# Patient Record
Sex: Female | Born: 1970 | Hispanic: No | Marital: Married | Smoking: Former smoker
Health system: Southern US, Community
[De-identification: ages and names within clinical notes are randomized; demographics above are authoritative.]

## PROBLEM LIST (undated history)

## (undated) DIAGNOSIS — E669 Obesity, unspecified: Secondary | ICD-10-CM

## (undated) DIAGNOSIS — M549 Dorsalgia, unspecified: Secondary | ICD-10-CM

## (undated) DIAGNOSIS — E282 Polycystic ovarian syndrome: Secondary | ICD-10-CM

## (undated) DIAGNOSIS — G8929 Other chronic pain: Secondary | ICD-10-CM

## (undated) DIAGNOSIS — I10 Essential (primary) hypertension: Secondary | ICD-10-CM

## (undated) DIAGNOSIS — Z720 Tobacco use: Secondary | ICD-10-CM

## (undated) DIAGNOSIS — Z9049 Acquired absence of other specified parts of digestive tract: Secondary | ICD-10-CM

## (undated) DIAGNOSIS — I6529 Occlusion and stenosis of unspecified carotid artery: Secondary | ICD-10-CM

## (undated) DIAGNOSIS — E079 Disorder of thyroid, unspecified: Secondary | ICD-10-CM

## (undated) DIAGNOSIS — Z8669 Personal history of other diseases of the nervous system and sense organs: Secondary | ICD-10-CM

## (undated) DIAGNOSIS — L680 Hirsutism: Secondary | ICD-10-CM

## (undated) HISTORY — DX: Obesity, unspecified: E66.9

## (undated) HISTORY — DX: Occlusion and stenosis of unspecified carotid artery: I65.29

## (undated) HISTORY — DX: Other chronic pain: G89.29

## (undated) HISTORY — DX: Acquired absence of other specified parts of digestive tract: Z90.49

## (undated) HISTORY — DX: Personal history of other diseases of the nervous system and sense organs: Z86.69

## (undated) HISTORY — DX: Disorder of thyroid, unspecified: E07.9

## (undated) HISTORY — DX: Hirsutism: L68.0

## (undated) HISTORY — DX: Tobacco use: Z72.0

## (undated) HISTORY — DX: Dorsalgia, unspecified: M54.9

## (undated) HISTORY — DX: Polycystic ovarian syndrome: E28.2

## (undated) HISTORY — DX: Essential (primary) hypertension: I10

---

## 2004-02-13 ENCOUNTER — Emergency Department: Payer: Self-pay | Admitting: Emergency Medicine

## 2004-02-17 ENCOUNTER — Ambulatory Visit: Payer: Self-pay

## 2005-05-18 ENCOUNTER — Emergency Department: Payer: Self-pay | Admitting: Emergency Medicine

## 2005-09-27 ENCOUNTER — Encounter: Admission: RE | Admit: 2005-09-27 | Discharge: 2005-09-27 | Payer: Self-pay | Admitting: Unknown Physician Specialty

## 2005-11-13 ENCOUNTER — Ambulatory Visit: Payer: Self-pay | Admitting: Pain Medicine

## 2006-03-22 ENCOUNTER — Ambulatory Visit: Payer: Self-pay | Admitting: Endocrinology

## 2006-04-23 DIAGNOSIS — I6529 Occlusion and stenosis of unspecified carotid artery: Secondary | ICD-10-CM

## 2006-04-23 HISTORY — DX: Occlusion and stenosis of unspecified carotid artery: I65.29

## 2006-08-16 ENCOUNTER — Ambulatory Visit: Payer: Self-pay | Admitting: Unknown Physician Specialty
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
## 2007-01-28 ENCOUNTER — Other Ambulatory Visit: Payer: Self-pay

## 2007-01-28 ENCOUNTER — Emergency Department: Payer: Self-pay | Admitting: Emergency Medicine

## 2007-02-06 ENCOUNTER — Ambulatory Visit: Payer: Self-pay | Admitting: Internal Medicine

## 2007-04-24 DIAGNOSIS — Z9049 Acquired absence of other specified parts of digestive tract: Secondary | ICD-10-CM

## 2007-04-24 HISTORY — DX: Acquired absence of other specified parts of digestive tract: Z90.49

## 2007-04-24 HISTORY — PX: CHOLECYSTECTOMY: SHX55

## 2007-05-08 ENCOUNTER — Ambulatory Visit: Payer: Self-pay | Admitting: Unknown Physician Specialty

## 2007-07-07 ENCOUNTER — Ambulatory Visit: Payer: Self-pay | Admitting: Unknown Physician Specialty

## 2007-09-17 ENCOUNTER — Ambulatory Visit: Payer: Self-pay | Admitting: Internal Medicine

## 2007-09-18 ENCOUNTER — Ambulatory Visit: Payer: Self-pay | Admitting: Podiatry

## 2007-10-15 ENCOUNTER — Ambulatory Visit: Payer: Self-pay | Admitting: General Surgery

## 2007-10-15 ENCOUNTER — Other Ambulatory Visit: Payer: Self-pay

## 2007-10-23 ENCOUNTER — Ambulatory Visit: Payer: Self-pay | Admitting: General Surgery

## 2008-08-21 ENCOUNTER — Ambulatory Visit: Payer: Self-pay | Admitting: Internal Medicine

## 2008-09-08 ENCOUNTER — Ambulatory Visit: Payer: Self-pay | Admitting: Internal Medicine

## 2008-09-20 ENCOUNTER — Ambulatory Visit: Payer: Self-pay | Admitting: Internal Medicine

## 2008-09-21 ENCOUNTER — Ambulatory Visit: Payer: Self-pay | Admitting: Internal Medicine

## 2008-09-29 ENCOUNTER — Ambulatory Visit: Payer: Self-pay | Admitting: Internal Medicine

## 2008-11-01 ENCOUNTER — Ambulatory Visit: Payer: Self-pay | Admitting: Internal Medicine

## 2008-11-21 ENCOUNTER — Ambulatory Visit: Payer: Self-pay | Admitting: Internal Medicine

## 2008-12-22 ENCOUNTER — Ambulatory Visit: Payer: Self-pay | Admitting: Internal Medicine

## 2008-12-24 ENCOUNTER — Ambulatory Visit: Payer: Self-pay | Admitting: Unknown Physician Specialty

## 2009-01-03 ENCOUNTER — Ambulatory Visit: Payer: Self-pay | Admitting: Internal Medicine

## 2009-01-21 ENCOUNTER — Ambulatory Visit: Payer: Self-pay | Admitting: Internal Medicine

## 2009-03-18 ENCOUNTER — Other Ambulatory Visit: Payer: Self-pay | Admitting: Internal Medicine

## 2009-03-29 ENCOUNTER — Ambulatory Visit: Payer: Self-pay | Admitting: Family

## 2009-03-30 ENCOUNTER — Ambulatory Visit: Payer: Self-pay | Admitting: Internal Medicine

## 2009-04-23 ENCOUNTER — Ambulatory Visit: Payer: Self-pay | Admitting: Internal Medicine

## 2009-05-12 IMAGING — US US EXTREM LOW VENOUS*L*
1 series · 18 of 24 positions shown · non-contrast
Comparison: none

REASON FOR EXAM: Swelling LEFT Calf
COMMENTS:

PROCEDURE:     US  - US DOPPLER LOW EXTR LEFT  - September 18, 2007  [DATE]
RESULT:     The LEFT femoral and popliteal veins are normally compressible.
The waveform patterns are normal, and the color-flow images are normal.

[Series 1: us extrem low venous*left* · 18 of 24 slices shown]
[im 1/24]
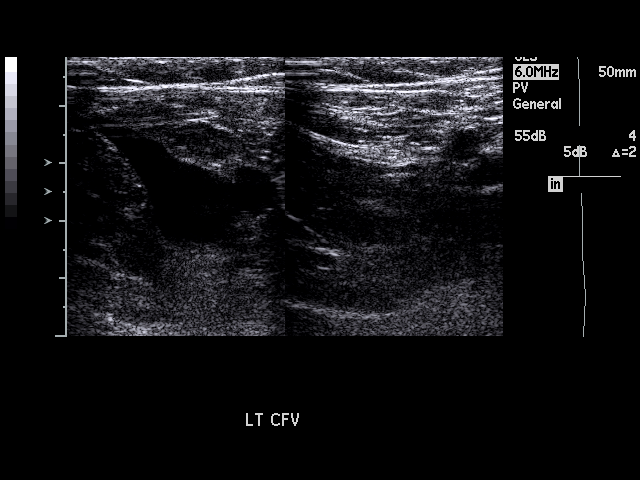
[im 3/24]
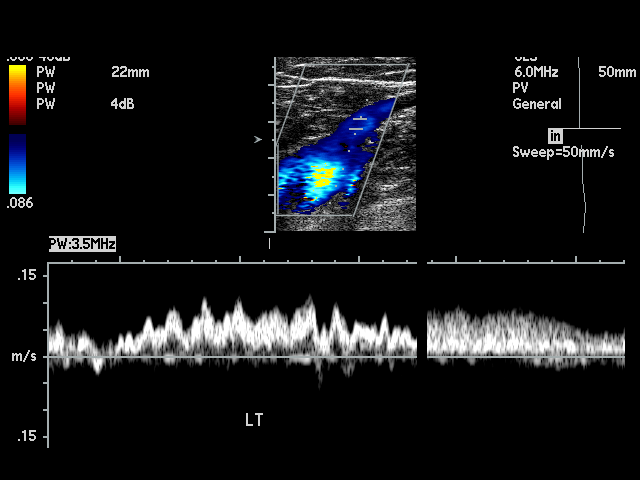
[im 4/24]
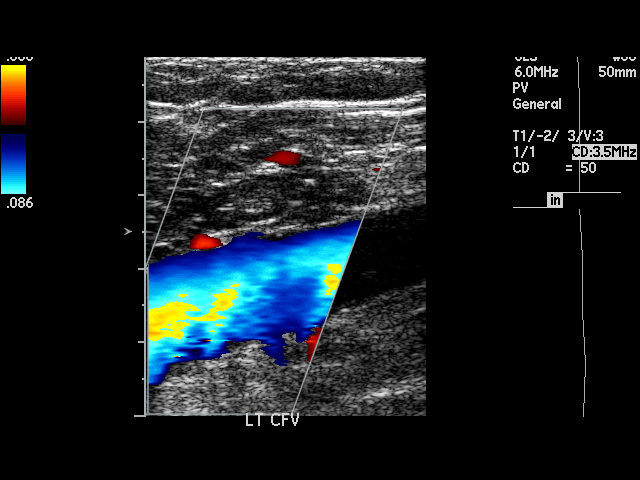
[im 5/24]
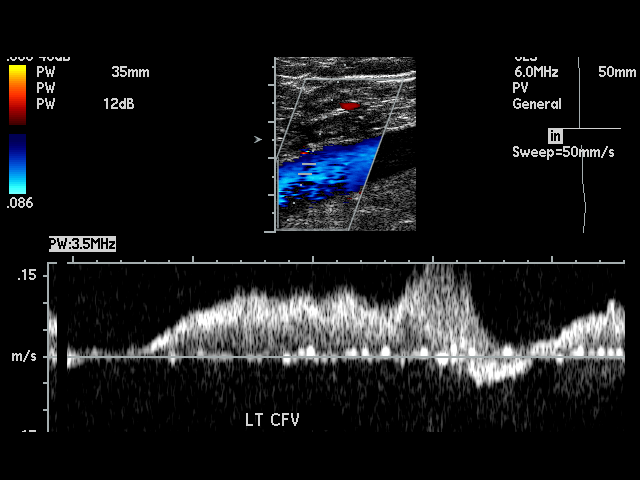
[im 7/24]
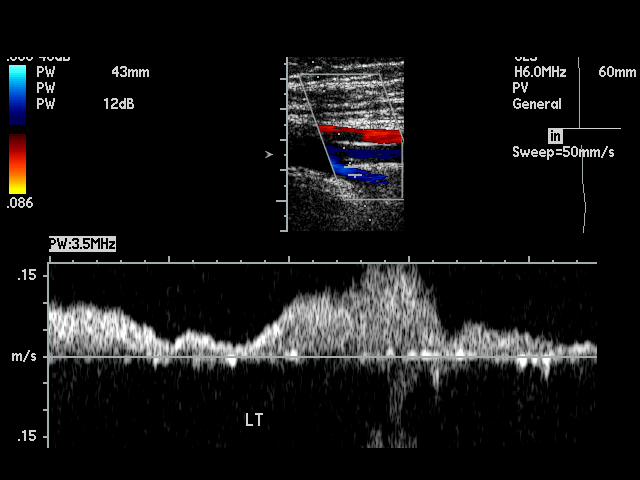
[im 8/24]
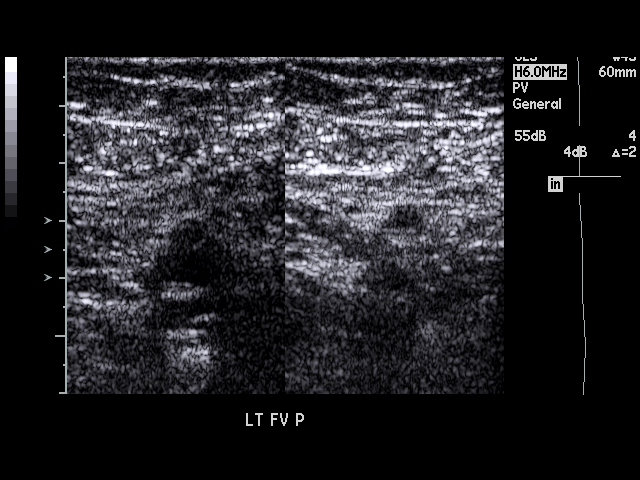
[im 9/24]
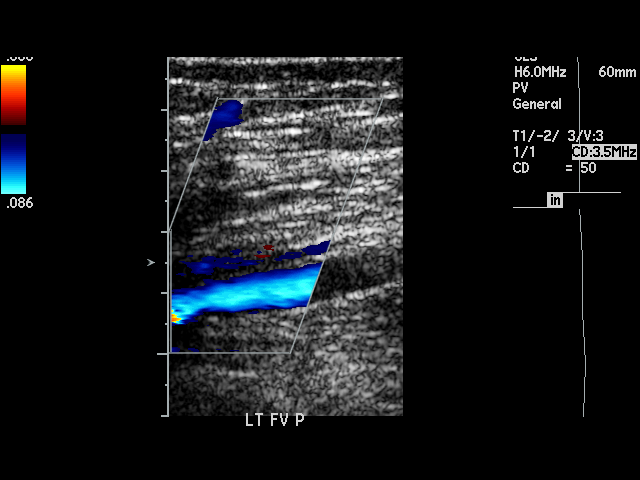
[im 11/24]
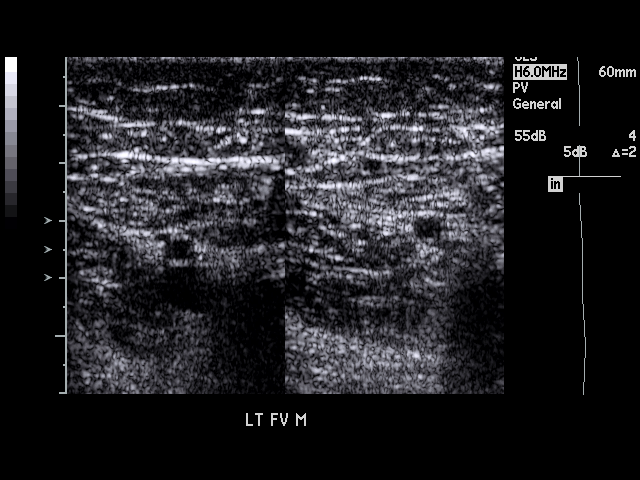
[im 12/24]
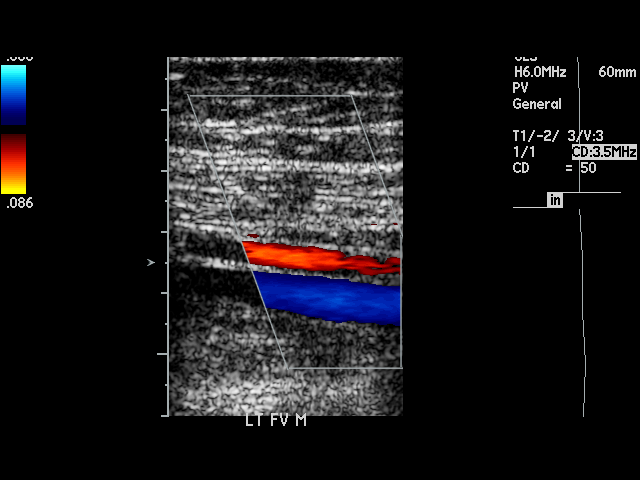
[im 13/24]
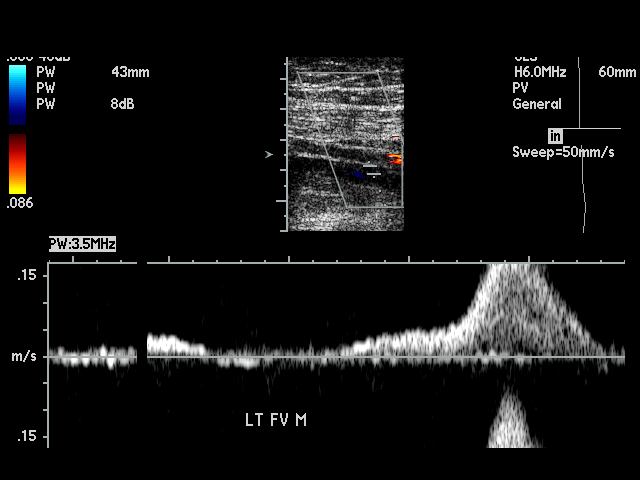
[im 15/24]
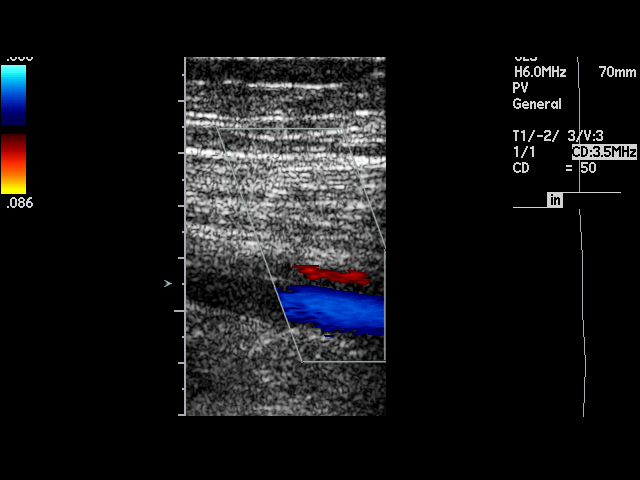
[im 16/24]
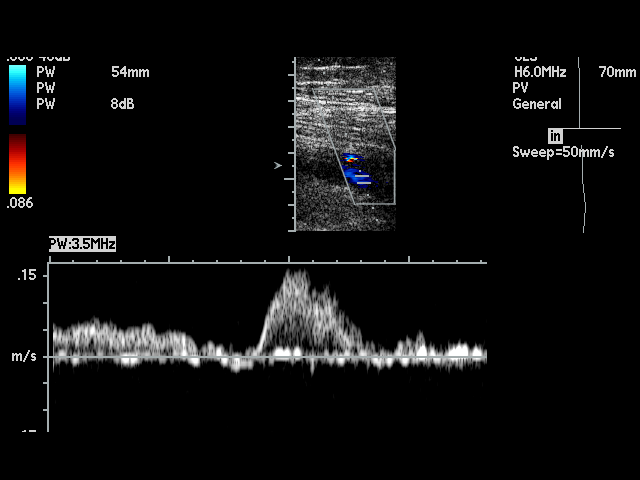
[im 17/24]
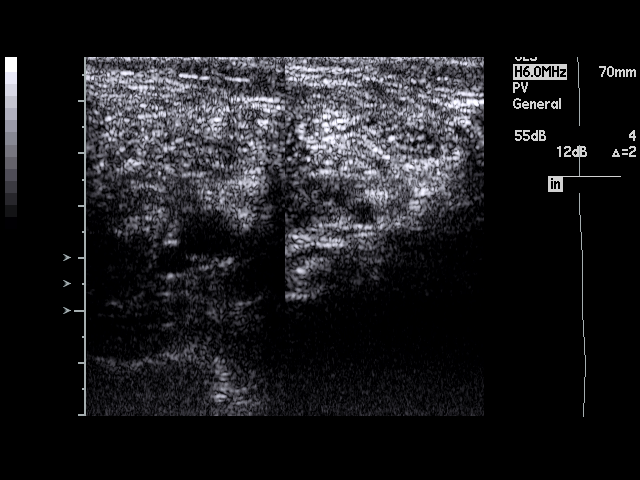
[im 19/24]
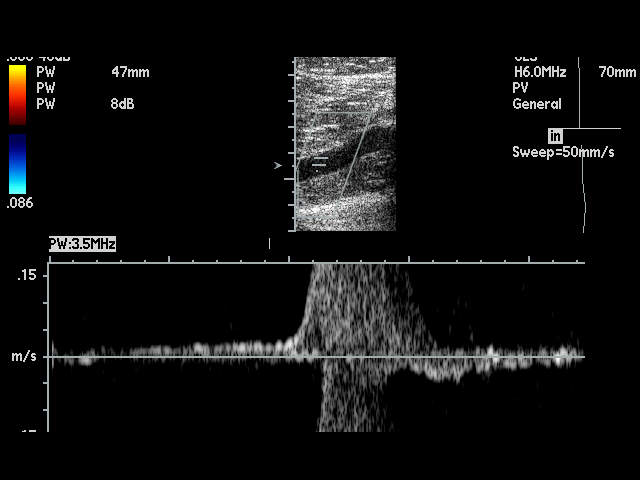
[im 20/24]
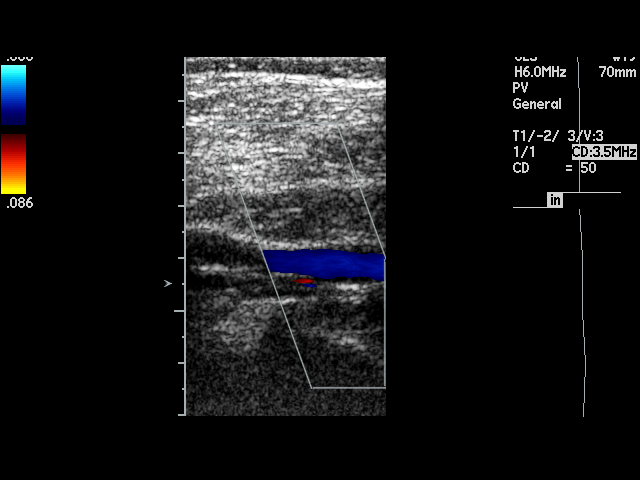
[im 21/24]
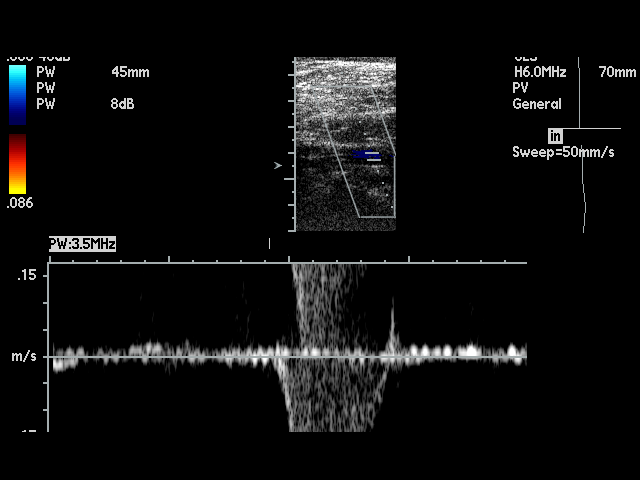
[im 23/24]
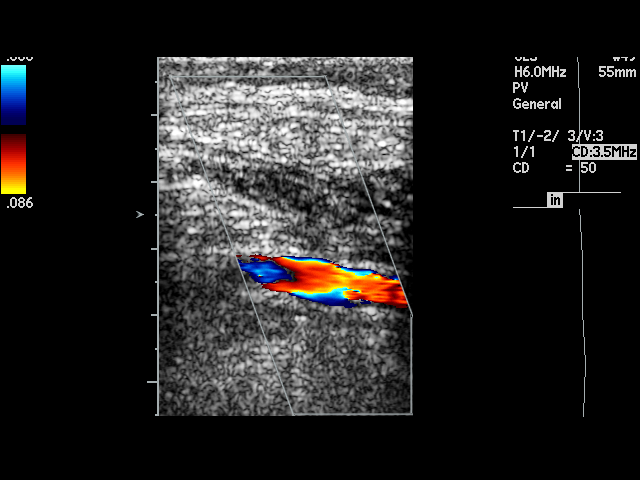
[im 24/24]
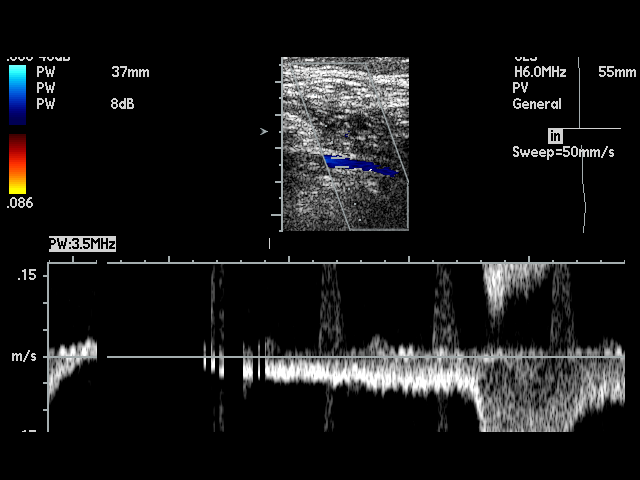

[18 of 24 positions shown; findings below may reference images not displayed]

IMPRESSION: I see no evidence of thrombus within the LEFT femoral or
popliteal veins.

## 2009-05-24 ENCOUNTER — Ambulatory Visit: Payer: Self-pay | Admitting: Internal Medicine

## 2009-06-20 ENCOUNTER — Ambulatory Visit: Payer: Self-pay | Admitting: Internal Medicine

## 2009-06-21 ENCOUNTER — Ambulatory Visit: Payer: Self-pay | Admitting: Internal Medicine

## 2010-01-09 ENCOUNTER — Observation Stay: Payer: Self-pay | Admitting: Internal Medicine

## 2010-01-19 ENCOUNTER — Ambulatory Visit: Payer: Self-pay | Admitting: Cardiovascular Disease

## 2010-01-26 ENCOUNTER — Ambulatory Visit: Payer: Self-pay | Admitting: Specialist

## 2010-01-26 ENCOUNTER — Ambulatory Visit: Payer: Self-pay | Admitting: Internal Medicine

## 2010-12-13 ENCOUNTER — Ambulatory Visit: Payer: Self-pay | Admitting: Internal Medicine

## 2010-12-13 ENCOUNTER — Encounter: Payer: Self-pay | Admitting: Internal Medicine

## 2010-12-13 ENCOUNTER — Ambulatory Visit (INDEPENDENT_AMBULATORY_CARE_PROVIDER_SITE_OTHER): Payer: BC Managed Care – PPO | Admitting: Internal Medicine

## 2010-12-13 DIAGNOSIS — E669 Obesity, unspecified: Secondary | ICD-10-CM

## 2010-12-13 DIAGNOSIS — G4733 Obstructive sleep apnea (adult) (pediatric): Secondary | ICD-10-CM

## 2010-12-13 DIAGNOSIS — J019 Acute sinusitis, unspecified: Secondary | ICD-10-CM

## 2010-12-13 DIAGNOSIS — R51 Headache: Secondary | ICD-10-CM

## 2010-12-13 DIAGNOSIS — J40 Bronchitis, not specified as acute or chronic: Secondary | ICD-10-CM | POA: Insufficient documentation

## 2010-12-13 HISTORY — DX: Obesity, unspecified: E66.9

## 2010-12-13 MED ORDER — AMOXICILLIN-POT CLAVULANATE 875-125 MG PO TABS: 1.0000 | ORAL_TABLET | Freq: Two times a day (BID) | ORAL | Status: AC

## 2010-12-13 MED ORDER — LEVOTHYROXINE SODIUM 175 MCG PO TABS: 175.0000 ug | ORAL_TABLET | Freq: Every day | ORAL | Status: DC

## 2010-12-13 MED ORDER — FLUCONAZOLE 150 MG PO TABS: 150.0000 mg | ORAL_TABLET | Freq: Every day | ORAL | Status: AC

## 2010-12-13 NOTE — Progress Notes (Signed)
  Subjective:    Patient ID: Madeline Strickland, female    DOB: 07-12-1970, 40 y.o.   MRN: [id number]  HPI Patient presents with 2 week history of sinus congestion, cough, scratch throat and ear pain.  Was seen by Dr. Alison Murray last week and given an rx for augmentin even though, per patient, Dr. Alison Murray did not feel she had a bacterial infection,  So patient did not start the medication.  Her prevous complaints of chest heaviness has resolved, but she contiues to have scratch throat, cough and ear pain.  No purulent rhinorrhea or fevers.    Review of Systems  Constitutional: Negative for fever, chills and unexpected weight change.  HENT: Positive for ear pain, congestion, rhinorrhea and sinus pressure. Negative for hearing loss, nosebleeds, sore throat, facial swelling, sneezing, mouth sores, trouble swallowing, neck pain, neck stiffness, voice change, postnasal drip, tinnitus and ear discharge.   Eyes: Negative for pain, discharge, redness and visual disturbance.  Respiratory: Positive for cough. Negative for chest tightness, shortness of breath, wheezing and stridor.   Cardiovascular: Negative for chest pain, palpitations and leg swelling.  Musculoskeletal: Negative for myalgias and arthralgias.  Skin: Negative for color change and rash.  Neurological: Negative for dizziness, weakness, light-headedness and headaches.  Hematological: Negative for adenopathy.       Objective:   Physical Exam  Constitutional: Vital signs are normal. She appears well-developed and well-nourished. She is active.  HENT:  Right Ear: Tympanic membrane is bulging.  Left Ear: Tympanic membrane is bulging.  Eyes: Pupils are equal, round, and reactive to light.  Cardiovascular: Normal rate, regular rhythm and S1 normal.   Pulmonary/Chest: No respiratory distress.          Assessment & Plan:  Otitis media:  Secondary to prlonged sinus congestion.  Incresae sudafed to tid,  Start augmentin.  Continue flonase rxd by  Dr. Alison Murray, and add simply saline bid to keep sinuses flushed.  Has history of large left sided maxillary polyp by prior ENT eval. Advised to use yogurt daily to prevent AAD.   Sleep apnea:  Needs split night study ,  At [facility].  Meredeth Ide is her pulmonologist.  Vaginitis:  Ex for fluconazole given for 2 day treatment of anticipated candidiasis.

## 2010-12-13 NOTE — Patient Instructions (Signed)
Use Simply Saline nasap spray twice daily before your Flonase to help flush your sinuses . Increase the sudafed to three times daily for the next week.

## 2010-12-18 ENCOUNTER — Other Ambulatory Visit: Payer: Self-pay | Admitting: Internal Medicine

## 2010-12-18 MED ORDER — MONTELUKAST SODIUM 10 MG PO TABS: 10.0000 mg | ORAL_TABLET | Freq: Every day | ORAL | Status: DC

## 2011-01-18 ENCOUNTER — Other Ambulatory Visit: Payer: Self-pay | Admitting: Internal Medicine

## 2011-01-19 ENCOUNTER — Telehealth: Payer: Self-pay | Admitting: Internal Medicine

## 2011-01-19 ENCOUNTER — Encounter: Payer: Self-pay | Admitting: Internal Medicine

## 2011-01-19 ENCOUNTER — Other Ambulatory Visit: Payer: Self-pay | Admitting: Internal Medicine

## 2011-01-19 ENCOUNTER — Ambulatory Visit (INDEPENDENT_AMBULATORY_CARE_PROVIDER_SITE_OTHER): Payer: BC Managed Care – PPO | Admitting: Internal Medicine

## 2011-01-19 VITALS — BP 112/69 | HR 70 | Temp 98.6°F | Resp 16 | Ht 68.0 in | Wt 247.8 lb

## 2011-01-19 DIAGNOSIS — J40 Bronchitis, not specified as acute or chronic: Secondary | ICD-10-CM

## 2011-01-19 DIAGNOSIS — J45901 Unspecified asthma with (acute) exacerbation: Secondary | ICD-10-CM

## 2011-01-19 DIAGNOSIS — J209 Acute bronchitis, unspecified: Secondary | ICD-10-CM

## 2011-01-19 MED ORDER — PREDNISONE (PAK) 10 MG PO TABS: ORAL_TABLET | ORAL | Status: AC

## 2011-01-19 MED ORDER — METHYLPREDNISOLONE ACETATE 40 MG/ML IJ SUSP
40.0000 mg | Freq: Once | INTRAMUSCULAR | Status: AC
  Administered 2011-01-19: 40 mg via INTRAMUSCULAR

## 2011-01-19 MED ORDER — AZITHROMYCIN 500 MG PO TABS: ORAL_TABLET | ORAL | Status: DC

## 2011-01-19 NOTE — Telephone Encounter (Signed)
Patient called asking for a late afternoon appt. Patient advised that Dr. Darrick Huntsman had no more opening per Morrie Sheldon and she should [facility]. Patient stated she usually calls and the M.D. Advises what to do. Patient wants M.D. Aware that she works out of town and can come in today at 4:30pm. Please assist and call patient back.

## 2011-01-19 NOTE — Patient Instructions (Signed)
We are treating you for asthma exacerbation secondary to bronchitis.   Take azithromycin 500 mg daily for 7 days, and prednisone 60 mg for three days,  Then start the taper by 10 mg daily until gone.

## 2011-01-19 NOTE — Progress Notes (Signed)
  Subjective:    Patient ID: Madeline Strickland, female    DOB: 03/07/1971, 40 y.o.   MRN: [id number]  HPI  40 yo AAA female with history of PCOS, obesity and asthma, and tobacco abuse presents with bronchitis, loss of voice,  symptoms started a week ago.    more short of breatrh at night, cough, and wheezing.    Review of Systems  Constitutional: Negative for fever, chills and unexpected weight change.  HENT: Negative for hearing loss, ear pain, nosebleeds, congestion, sore throat, facial swelling, rhinorrhea, sneezing, mouth sores, trouble swallowing, neck pain, neck stiffness, voice change, postnasal drip, sinus pressure, tinnitus and ear discharge.   Eyes: Negative for pain, discharge, redness and visual disturbance.  Respiratory: Positive for cough, chest tightness, shortness of breath and wheezing. Negative for stridor.   Cardiovascular: Negative for chest pain, palpitations and leg swelling.  Musculoskeletal: Negative for myalgias and arthralgias.  Skin: Negative for color change and rash.  Neurological: Negative for dizziness, weakness, light-headedness and headaches.  Hematological: Negative for adenopathy.       Objective:   Physical Exam  Constitutional: She is oriented to person, place, and time. She appears well-developed and well-nourished.  HENT:  Mouth/Throat: Oropharynx is clear and moist.  Eyes: EOM are normal. Pupils are equal, round, and reactive to light. No scleral icterus.  Neck: Normal range of motion. Neck supple. No JVD present. No thyromegaly present.  Cardiovascular: Normal rate, regular rhythm, normal heart sounds and intact distal pulses.   Pulmonary/Chest: Effort normal and breath sounds normal. She has no wheezes. She exhibits no tenderness.  Abdominal: Soft. Bowel sounds are normal. She exhibits no mass. There is no tenderness.  Musculoskeletal: Normal range of motion. She exhibits no edema.  Lymphadenopathy:    She has no cervical adenopathy.  Neurological: She  is alert and oriented to person, place, and time.  Skin: Skin is warm and dry.  Psychiatric: She has a normal mood and affect.          Assessment & Plan:

## 2011-01-19 NOTE — Telephone Encounter (Signed)
Patient is coming in this afternoon.

## 2011-01-20 ENCOUNTER — Encounter: Payer: Self-pay | Admitting: Internal Medicine

## 2011-01-20 DIAGNOSIS — I10 Essential (primary) hypertension: Secondary | ICD-10-CM | POA: Insufficient documentation

## 2011-01-20 DIAGNOSIS — E282 Polycystic ovarian syndrome: Secondary | ICD-10-CM | POA: Insufficient documentation

## 2011-01-20 DIAGNOSIS — E034 Atrophy of thyroid (acquired): Secondary | ICD-10-CM | POA: Insufficient documentation

## 2011-01-20 DIAGNOSIS — J45909 Unspecified asthma, uncomplicated: Secondary | ICD-10-CM | POA: Insufficient documentation

## 2011-01-21 ENCOUNTER — Encounter: Payer: Self-pay | Admitting: Internal Medicine

## 2011-01-21 DIAGNOSIS — Z87891 Personal history of nicotine dependence: Secondary | ICD-10-CM | POA: Insufficient documentation

## 2011-01-21 DIAGNOSIS — Z720 Tobacco use: Secondary | ICD-10-CM | POA: Insufficient documentation

## 2011-01-21 NOTE — Assessment & Plan Note (Signed)
Current exacerbation lasting 3-4 days.  She is currently not wheezing on exam but had just used her inhaler.  Prednisone taper and abs stated.

## 2011-01-22 MED ORDER — ALPRAZOLAM 0.5 MG PO TABS: 0.5000 mg | ORAL_TABLET | Freq: Two times a day (BID) | ORAL | Status: DC | PRN

## 2011-02-14 ENCOUNTER — Other Ambulatory Visit: Payer: Self-pay | Admitting: Internal Medicine

## 2011-03-16 ENCOUNTER — Encounter: Payer: Self-pay | Admitting: Internal Medicine

## 2011-03-20 ENCOUNTER — Other Ambulatory Visit: Payer: Self-pay | Admitting: Internal Medicine

## 2011-03-20 MED ORDER — METOPROLOL SUCCINATE ER 50 MG PO TB24: 50.0000 mg | ORAL_TABLET | Freq: Every day | ORAL | Status: DC

## 2011-03-27 ENCOUNTER — Other Ambulatory Visit (HOSPITAL_COMMUNITY)
Admission: RE | Admit: 2011-03-27 | Discharge: 2011-03-27 | Disposition: A | Discharge status: Home / Self Care | Payer: BC Managed Care – PPO | Source: Ambulatory Visit | Attending: Internal Medicine | Admitting: Internal Medicine

## 2011-03-27 ENCOUNTER — Encounter: Payer: Self-pay | Admitting: Internal Medicine

## 2011-03-27 ENCOUNTER — Ambulatory Visit (INDEPENDENT_AMBULATORY_CARE_PROVIDER_SITE_OTHER): Payer: BC Managed Care – PPO | Admitting: Internal Medicine

## 2011-03-27 DIAGNOSIS — R0683 Snoring: Secondary | ICD-10-CM

## 2011-03-27 DIAGNOSIS — E063 Autoimmune thyroiditis: Secondary | ICD-10-CM

## 2011-03-27 DIAGNOSIS — G4733 Obstructive sleep apnea (adult) (pediatric): Secondary | ICD-10-CM

## 2011-03-27 DIAGNOSIS — R0989 Other specified symptoms and signs involving the circulatory and respiratory systems: Secondary | ICD-10-CM

## 2011-03-27 DIAGNOSIS — Z124 Encounter for screening for malignant neoplasm of cervix: Secondary | ICD-10-CM

## 2011-03-27 DIAGNOSIS — Z1159 Encounter for screening for other viral diseases: Secondary | ICD-10-CM | POA: Insufficient documentation

## 2011-03-27 DIAGNOSIS — E669 Obesity, unspecified: Secondary | ICD-10-CM

## 2011-03-27 DIAGNOSIS — Z01419 Encounter for gynecological examination (general) (routine) without abnormal findings: Secondary | ICD-10-CM | POA: Insufficient documentation

## 2011-03-27 DIAGNOSIS — Z1239 Encounter for other screening for malignant neoplasm of breast: Secondary | ICD-10-CM

## 2011-03-27 DIAGNOSIS — R911 Solitary pulmonary nodule: Secondary | ICD-10-CM

## 2011-03-27 LAB — COMPREHENSIVE METABOLIC PANEL
ALT: 25 U/L (ref 0–35)
AST: 23 U/L (ref 0–37)
Albumin: 4 g/dL (ref 3.5–5.2)
Alkaline Phosphatase: 154 U/L — ABNORMAL HIGH (ref 39–117)
Potassium: 4.6 mEq/L (ref 3.5–5.1)
Sodium: 141 mEq/L (ref 135–145)
Total Bilirubin: 0.2 mg/dL — ABNORMAL LOW (ref 0.3–1.2)

## 2011-03-27 LAB — LIPID PANEL
LDL Cholesterol: 66 mg/dL (ref 0–99)
Total CHOL/HDL Ratio: 4
Triglycerides: 119 mg/dL (ref 0.0–149.0)
VLDL: 23.8 mg/dL (ref 0.0–40.0)

## 2011-03-27 LAB — TSH: TSH: 0.93 u[IU]/mL (ref 0.35–5.50)

## 2011-03-27 NOTE — Patient Instructions (Signed)
Laparoscopic Gastric Band Surgery This surgery is done to help you lose weight.  BEFORE THE SURGERY  Do not gain any more weight once you know you will be having this surgery.   Arrange for someone to take you home from the hospital.   The day before the surgery:   Eat small liquid meals such as broth.   Use half of the surgical scrub, if given, to shower or bathe with. Do not use the surgical scrub to wash your hair. Use regular shampoo.   The day of the surgery:   Shower or bathe using the second half of the surgical scrub.   Arrive at your appointment time.   You will change into a hospital gown.   A tube (IV) will be put in your vein.  SURGERY A band is put around the upper part of your stomach. This makes a small pouch which can hold only a small amount of food. The lower, bigger part of your stomach is below the band. The 2 parts stay connected by a small opening between the upper and the lower parts. Food goes through the opening to the lower part of your stomach more slowly than before the surgery. You will feel more full with smaller amounts of food. On the inner lining of the band around your stomach is a balloon. The balloon is empty during the surgery. Later, at an office visit, it is filled with fluid. Your doctor puts the fluid in through a tube (port) that is right under the skin of your belly. AFTER THE SURGERY  You will go to the recovery room.   You may be given pain medicine.   You may be asked to walk once you are stable.   You may have shoulder pain caused by the gas.   By the time you go home, try to walk for 35 minutes every day.   You will be shown how to use a small breathing machine (incentive spirometer). This will help you take deep breaths. You need to use this machine several times a day while you are in the hospital and after you go home.   You will need to take another test. For this test, you will swallow a liquid that will show up on X-ray.  You will have X-rays taken while you are in different positions. These X-rays will show:   If your new stomach has any leaks.   How well your new stomach holds liquids.   How the liquid moves down to your gut.   Try to not throw up (vomit). Tell your doctor if you feel sick to your stomach (nauseous). There is medicine to help keep you from throwing up.   Your diet will begin with drinking clear liquids (jello, tea, juice and broth) in small amounts.   Your doctor will decide when you are ready to drink or eat more.  Document Released: 05/12/2010 Document Revised: 12/20/2010 Document Reviewed: 05/12/2010 [facility] Patient Information 2012 [place], [place].

## 2011-03-27 NOTE — Progress Notes (Signed)
  Subjective:    Patient ID: Madeline Strickland, female    DOB: [birth year], 40 y.o.   MRN: [id number]  HPI 40 yo female with history of obesity, asthma, hypothyroidism , ongoing minimal tobacco abus, probable sleep apnea  presents for annual physical.  Other than fatigue, she feels generally well.  No asthmaxacerbations in over 6 monhts.  She is unable to lose weight despite making concerted efforts to restrict her calories and her carbohydrates.  Her exercise history is hindered by ehr astham and joint pain.  She is interested  In losing weight by pursing  lap band surgery.      Review of Systems  Constitutional: Negative for fever, chills and unexpected weight change.  HENT: Negative for hearing loss, ear pain, nosebleeds, congestion, sore throat, facial swelling, rhinorrhea, sneezing, mouth sores, trouble swallowing, neck pain, neck stiffness, voice change, postnasal drip, sinus pressure, tinnitus and ear discharge.   Eyes: Negative for pain, discharge, redness and visual disturbance.  Respiratory: Negative for cough, chest tightness, shortness of breath, wheezing and stridor.   Cardiovascular: Negative for chest pain, palpitations and leg swelling.  Musculoskeletal: Negative for myalgias and arthralgias.  Skin: Negative for color change and rash.  Neurological: Negative for dizziness, weakness, light-headedness and headaches.  Hematological: Negative for adenopathy.       Objective:   Physical Exam  Constitutional: She is oriented to person, place, and time. She appears well-developed and well-nourished.       Obese,  hirsute  HENT:  Mouth/Throat: Oropharynx is clear and moist.  Eyes: EOM are normal. Pupils are equal, round, and reactive to light. No scleral icterus.  Neck: Normal range of motion. Neck supple. No JVD present. No thyromegaly present.  Cardiovascular: Normal rate, regular rhythm, normal heart sounds and intact distal pulses.   Pulmonary/Chest: Effort normal and breath sounds  normal.  Abdominal: Soft. Bowel sounds are normal. She exhibits no mass. There is no tenderness.  Genitourinary: Vagina normal and uterus normal.  Musculoskeletal: Normal range of motion. She exhibits no edema.  Lymphadenopathy:    She has no cervical adenopathy.  Neurological: She is alert and oriented to person, place, and time.  Skin: Skin is warm and dry.  Psychiatric: She has a normal mood and affect.          Assessment & Plan:

## 2011-03-27 NOTE — Assessment & Plan Note (Addendum)
With a 140 lb wt gain since high school.    Wt was 244 bmi 37 in July,  No significant change.  Referral to [facility] to determine if she is a candidate for lap band surgery.

## 2011-03-28 ENCOUNTER — Encounter: Payer: Self-pay | Admitting: Internal Medicine

## 2011-03-28 DIAGNOSIS — M549 Dorsalgia, unspecified: Secondary | ICD-10-CM | POA: Insufficient documentation

## 2011-03-28 DIAGNOSIS — E282 Polycystic ovarian syndrome: Secondary | ICD-10-CM | POA: Insufficient documentation

## 2011-03-28 DIAGNOSIS — Z1239 Encounter for other screening for malignant neoplasm of breast: Secondary | ICD-10-CM | POA: Insufficient documentation

## 2011-03-28 NOTE — Assessment & Plan Note (Signed)
Breast exam was done today and screening mammogram ordered.

## 2011-03-28 NOTE — Assessment & Plan Note (Signed)
Suspected by history but sleep sutdy has been deferred.  She is willing to have it done now.  Study ordered

## 2011-03-28 NOTE — Assessment & Plan Note (Signed)
PAP was done today 

## 2011-04-03 ENCOUNTER — Encounter: Payer: Self-pay | Admitting: Internal Medicine

## 2011-04-09 ENCOUNTER — Other Ambulatory Visit: Payer: Self-pay | Admitting: Internal Medicine

## 2011-04-09 MED ORDER — ESOMEPRAZOLE MAGNESIUM 40 MG PO CPDR: 40.0000 mg | DELAYED_RELEASE_CAPSULE | Freq: Every day | ORAL | Status: DC

## 2011-04-09 NOTE — Telephone Encounter (Signed)
I called patient back and gave her [facility] numer which is [phone], she has to call and schedule a free weight loss seminar.  Her mammogram is scheduled for 05/29/11 at 6:00 at [facility].  I also advised her that I was waiting on Dr. Darrick Huntsman to sign the [facility] referral and they would contact her about the in home sleep study.

## 2011-04-11 ENCOUNTER — Telehealth: Payer: Self-pay | Admitting: Internal Medicine

## 2011-04-11 NOTE — Telephone Encounter (Signed)
Patient is still waiting on her authorization to receive her Nexium .

## 2011-04-12 ENCOUNTER — Other Ambulatory Visit: Payer: Self-pay | Admitting: *Deleted

## 2011-04-12 NOTE — Telephone Encounter (Signed)
There is already a note about this. I am closing this one.

## 2011-04-13 NOTE — Telephone Encounter (Signed)
Prior Berkley Harvey has already been done. I spoke with pharmacy and was advised that rx is ready for her to pick up. I called patient and notified her that her rx is ready at the pharmacy and that she will have a $18 dollar copay per the pharmacy .

## 2011-04-18 ENCOUNTER — Telehealth: Payer: Self-pay | Admitting: *Deleted

## 2011-04-18 NOTE — Telephone Encounter (Signed)
Prior auth given for nexium, advised pharmacy.  Approval letter placed on doctor's desk for signature and scanning. 

## 2011-04-26 ENCOUNTER — Other Ambulatory Visit: Payer: Self-pay | Admitting: *Deleted

## 2011-04-26 MED ORDER — MONTELUKAST SODIUM 10 MG PO TABS: 10.0000 mg | ORAL_TABLET | Freq: Every day | ORAL | Status: DC

## 2011-04-26 NOTE — Telephone Encounter (Signed)
Faxed request from [facility], last filled 03/24/11.

## 2011-04-30 ENCOUNTER — Ambulatory Visit (INDEPENDENT_AMBULATORY_CARE_PROVIDER_SITE_OTHER): Payer: BC Managed Care – PPO | Admitting: Internal Medicine

## 2011-04-30 ENCOUNTER — Encounter: Payer: Self-pay | Admitting: Internal Medicine

## 2011-04-30 VITALS — BP 104/70 | HR 80 | Temp 98.8°F | Wt 245.0 lb

## 2011-04-30 DIAGNOSIS — J45901 Unspecified asthma with (acute) exacerbation: Secondary | ICD-10-CM

## 2011-04-30 MED ORDER — AMOXICILLIN-POT CLAVULANATE 875-125 MG PO TABS: 1.0000 | ORAL_TABLET | Freq: Two times a day (BID) | ORAL | Status: AC

## 2011-04-30 MED ORDER — FLUCONAZOLE 150 MG PO TABS: 150.0000 mg | ORAL_TABLET | Freq: Every day | ORAL | Status: AC

## 2011-04-30 MED ORDER — ALBUTEROL SULFATE 1.25 MG/3ML IN NEBU: 1.0000 | INHALATION_SOLUTION | Freq: Four times a day (QID) | RESPIRATORY_TRACT | Status: DC | PRN

## 2011-04-30 NOTE — Patient Instructions (Addendum)
Try "Align" for the diarrhea.   Delsym  OTC for cough   Augmentin and albuterol and fluconazole  to [facility]
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
## 2011-05-28 ENCOUNTER — Other Ambulatory Visit: Payer: Self-pay | Admitting: *Deleted

## 2011-05-28 DIAGNOSIS — E282 Polycystic ovarian syndrome: Secondary | ICD-10-CM

## 2011-05-28 MED ORDER — SPIRONOLACTONE 100 MG PO TABS: 100.0000 mg | ORAL_TABLET | Freq: Every day | ORAL | Status: DC

## 2011-05-28 NOTE — Telephone Encounter (Signed)
Faxed request from [facility].  Request is for one tablet twice a day, chart has that she takes one a day.

## 2011-06-29 ENCOUNTER — Other Ambulatory Visit: Payer: Self-pay | Admitting: *Deleted

## 2011-06-29 MED ORDER — LEVOTHYROXINE SODIUM 175 MCG PO TABS: 175.0000 ug | ORAL_TABLET | Freq: Every day | ORAL | Status: DC

## 2011-07-13 ENCOUNTER — Telehealth: Payer: Self-pay | Admitting: Internal Medicine

## 2011-07-13 DIAGNOSIS — E282 Polycystic ovarian syndrome: Secondary | ICD-10-CM

## 2011-07-13 MED ORDER — SPIRONOLACTONE 100 MG PO TABS: 100.0000 mg | ORAL_TABLET | Freq: Two times a day (BID) | ORAL | Status: DC

## 2011-07-13 NOTE — Telephone Encounter (Signed)
Rx has been called in  

## 2011-07-13 NOTE — Telephone Encounter (Signed)
Left message asking patient to return my call.

## 2011-07-13 NOTE — Telephone Encounter (Signed)
Patient was taking two a day for years now the pharmacist tells her she is to take it once daily and that they called and verified it with this office.

## 2011-07-30 ENCOUNTER — Other Ambulatory Visit: Payer: Self-pay | Admitting: Internal Medicine

## 2011-07-30 MED ORDER — METOPROLOL SUCCINATE ER 50 MG PO TB24: 50.0000 mg | ORAL_TABLET | Freq: Every day | ORAL | Status: DC

## 2011-08-20 ENCOUNTER — Other Ambulatory Visit: Payer: Self-pay | Admitting: Internal Medicine

## 2011-08-20 MED ORDER — ALPRAZOLAM 0.5 MG PO TABS: 0.5000 mg | ORAL_TABLET | Freq: Two times a day (BID) | ORAL | Status: DC | PRN

## 2011-08-27 ENCOUNTER — Other Ambulatory Visit: Payer: Self-pay | Admitting: Internal Medicine

## 2011-08-27 MED ORDER — LEVOTHYROXINE SODIUM 175 MCG PO TABS: 175.0000 ug | ORAL_TABLET | Freq: Every day | ORAL | Status: DC

## 2011-08-27 MED ORDER — METOPROLOL SUCCINATE ER 50 MG PO TB24: 50.0000 mg | ORAL_TABLET | Freq: Every day | ORAL | Status: DC

## 2011-09-05 ENCOUNTER — Other Ambulatory Visit: Payer: Self-pay | Admitting: Internal Medicine

## 2011-09-05 MED ORDER — METOPROLOL SUCCINATE ER 50 MG PO TB24: 50.0000 mg | ORAL_TABLET | Freq: Every day | ORAL | Status: DC

## 2011-09-26 ENCOUNTER — Telehealth: Payer: Self-pay | Admitting: Internal Medicine

## 2011-09-26 NOTE — Telephone Encounter (Signed)
Left message on cell phone voicemail advising patient to return call.

## 2011-09-26 NOTE — Telephone Encounter (Signed)
Caller: Madeline Strickland/Patient; PCP: Duncan Dull; CB#: [phone];  Call regarding Cough/Congestion; LMP 05/13 Sinus congestion, facial pain, coughing up yellow phlegm onset 09/18/11. See in 24 per URI Protocol. No appts are available. [name] in the ofc advises UC. Pt is informed. She declines today. Will call in AM to see if there have been any cancellations.

## 2011-09-26 NOTE — Telephone Encounter (Signed)
Tell her to take generic OTC benadryl 25 mg evry 8 hours for the drainage,  Sudafed PE  10 to 20 mg every 6 hours for the congestion,  and flushes her sinuses twice daily with Simply Saline.   Based on symptoms given does not need abx

## 2011-09-27 NOTE — Telephone Encounter (Signed)
Patient notified, she stated she thinks she needs to be seen because she has asthma and always ends up in the ER.   I put her on with Dr. Dan Humphreys tomorrow.

## 2011-09-28 ENCOUNTER — Encounter: Payer: Self-pay | Admitting: Internal Medicine

## 2011-09-28 ENCOUNTER — Ambulatory Visit (INDEPENDENT_AMBULATORY_CARE_PROVIDER_SITE_OTHER): Payer: BC Managed Care – PPO | Admitting: Internal Medicine

## 2011-09-28 VITALS — BP 118/72 | HR 74 | Temp 99.1°F | Resp 16 | Wt 244.0 lb

## 2011-09-28 DIAGNOSIS — J0100 Acute maxillary sinusitis, unspecified: Secondary | ICD-10-CM

## 2011-09-28 MED ORDER — AMOXICILLIN-POT CLAVULANATE 875-125 MG PO TABS: 1.0000 | ORAL_TABLET | Freq: Two times a day (BID) | ORAL | Status: AC

## 2011-09-28 NOTE — Progress Notes (Signed)
Subjective:    Patient ID: Madeline Strickland, female    DOB: 19-Nov-1970, 41 y.o.   MRN: [id number]  HPI 41 year old female with history of obesity, asthma, hypertension presents for acute visit complaining of one-week history of purulent nasal drainage, sinus pressure, headache pain, fever, chills, and cough productive of purulent sputum. She denies any chest pain or shortness of breath. She reports significant pain across her cheeks and forehead. She has been using over-the-counter cough and cold preparations with no improvement in her symptoms.  Outpatient Prescriptions Prior to Visit  Medication Sig Dispense Refill  . albuterol (ACCUNEB) 1.25 MG/3ML nebulizer solution Take 3 mLs (1.25 mg total) by nebulization every 6 (six) hours as needed for wheezing.  75 mL  12  . ALPRAZolam (XANAX) 0.5 MG tablet Take 1 tablet (0.5 mg total) by mouth 2 (two) times daily as needed for sleep or anxiety.  60 tablet  3  . Alum & Mag Hydroxide-Simeth (MAALOX ADVANCED PO) Take by mouth at bedtime.        Marland Kitchen atorvastatin (LIPITOR) 40 MG tablet TAKE 1 TABLET EVERY DAY FOR CHOLESTEROL  30 tablet  9  . b complex vitamins tablet Take 1 tablet by mouth daily.        . Cyanocobalamin (VITAMIN B-12 IJ) Inject 1 mL as directed every 30 (thirty) days.        Marland Kitchen esomeprazole (NEXIUM) 40 MG capsule Take 1 capsule (40 mg total) by mouth daily before breakfast.  30 capsule  5  . ferrous fumarate (HEMOCYTE - 106 MG FE) 325 (106 FE) MG TABS Take 1 tablet by mouth.        . levothyroxine (SYNTHROID, LEVOTHROID) 175 MCG tablet Take 1 tablet (175 mcg total) by mouth daily.  30 tablet  5  . metoprolol succinate (TOPROL-XL) 50 MG 24 hr tablet Take 1 tablet (50 mg total) by mouth daily.  30 tablet  3  . montelukast (SINGULAIR) 10 MG tablet Take 1 tablet (10 mg total) by mouth at bedtime.  30 tablet  5  . spironolactone (ALDACTONE) 100 MG tablet Take 1 tablet (100 mg total) by mouth 2 (two) times daily.  60 tablet  3    Review of Systems   Constitutional: Positive for fever and chills. Negative for unexpected weight change.  HENT: Positive for congestion and sinus pressure. Negative for hearing loss, ear pain, nosebleeds, sore throat, facial swelling, rhinorrhea, sneezing, mouth sores, trouble swallowing, neck pain, neck stiffness, voice change, postnasal drip, tinnitus and ear discharge.   Eyes: Negative for pain, discharge, redness and visual disturbance.  Respiratory: Positive for cough. Negative for chest tightness, shortness of breath, wheezing and stridor.   Cardiovascular: Negative for chest pain, palpitations and leg swelling.  Musculoskeletal: Negative for myalgias and arthralgias.  Skin: Negative for color change and rash.  Neurological: Positive for headaches. Negative for dizziness, weakness and light-headedness.  Hematological: Negative for adenopathy.   BP 118/72  Pulse 74  Temp(Src) 99.1 F (37.3 C) (Oral)  Resp 16  Wt 244 lb (110.678 kg)  SpO2 99%     Objective:   Physical Exam  Constitutional: She is oriented to person, place, and time. She appears well-developed and well-nourished. No distress.  HENT:  Head: Normocephalic and atraumatic.  Right Ear: External ear normal.  Left Ear: External ear normal.  Nose: Mucosal edema present. Right sinus exhibits maxillary sinus tenderness.  Mouth/Throat: Oropharynx is clear and moist. No oropharyngeal exudate.  Eyes: Conjunctivae are normal. Pupils are equal, round,  and reactive to light. Right eye exhibits no discharge. Left eye exhibits no discharge. No scleral icterus.  Neck: Normal range of motion. Neck supple. No tracheal deviation present. No thyromegaly present.  Cardiovascular: Normal rate, regular rhythm, normal heart sounds and intact distal pulses.  Exam reveals no gallop and no friction rub.   No murmur heard. Pulmonary/Chest: Effort normal. No accessory muscle usage. Not tachypneic. No respiratory distress. She has no wheezes. She has rhonchi (few  scattered). She has no rales. She exhibits no tenderness.  Musculoskeletal: Normal range of motion. She exhibits no edema and no tenderness.  Lymphadenopathy:    She has no cervical adenopathy.  Neurological: She is alert and oriented to person, place, and time. No cranial nerve deficit. She exhibits normal muscle tone. Coordination normal.  Skin: Skin is warm and dry. No rash noted. She is not diaphoretic. No erythema. No pallor.  Psychiatric: She has a normal mood and affect. Her behavior is normal. Judgment and thought content normal.          Assessment & Plan:

## 2011-09-28 NOTE — Assessment & Plan Note (Signed)
Symptoms consistent with acute maxillary sinusitis. Will treat with Augmentin. Will use ibuprofen for inflammation. Patient will call if symptoms are not improving over the next 72 hours. If no improvement, would favor adding steroid taper.

## 2011-11-07 ENCOUNTER — Telehealth: Payer: Self-pay | Admitting: Internal Medicine

## 2011-11-07 NOTE — Telephone Encounter (Signed)
Caller: Viona/Patient; PCP: Duncan Dull; CB#: [phone];  Call regarding "Busted Ear Drum";  Reports itchy/painful L ear and intermittent minor bleeding on Q-tip.  Onset: 7/45/13.  Afebrile. Hx otitis media.  Stop itching ear with Q-tip; may try otc antihistamine for ear itching and cold compress prn.  Advised to see MD for constant dull earache may interfere with normal activities per Ear Symptoms Guideline. Already scheduled by office for appt 11/08/11 at 0900.

## 2011-11-08 ENCOUNTER — Encounter: Payer: Self-pay | Admitting: Internal Medicine

## 2011-11-08 ENCOUNTER — Ambulatory Visit (INDEPENDENT_AMBULATORY_CARE_PROVIDER_SITE_OTHER): Payer: BC Managed Care – PPO | Admitting: Internal Medicine

## 2011-11-08 VITALS — BP 112/72 | HR 67 | Temp 98.8°F | Wt 242.0 lb

## 2011-11-08 DIAGNOSIS — S0991XA Unspecified injury of ear, initial encounter: Secondary | ICD-10-CM

## 2011-11-08 DIAGNOSIS — E669 Obesity, unspecified: Secondary | ICD-10-CM

## 2011-11-08 DIAGNOSIS — S0993XA Unspecified injury of face, initial encounter: Secondary | ICD-10-CM

## 2011-11-08 DIAGNOSIS — H60509 Unspecified acute noninfective otitis externa, unspecified ear: Secondary | ICD-10-CM | POA: Insufficient documentation

## 2011-11-08 MED ORDER — MOMETASONE FUROATE 0.1 % EX OINT: TOPICAL_OINTMENT | Freq: Every day | CUTANEOUS | Status: AC

## 2011-11-08 MED ORDER — MONTELUKAST SODIUM 10 MG PO TABS: 10.0000 mg | ORAL_TABLET | Freq: Every day | ORAL | Status: DC

## 2011-11-08 MED ORDER — CYANOCOBALAMIN 1000 MCG/ML IJ SOLN: 1000.0000 ug | INTRAMUSCULAR | Status: DC

## 2011-11-08 NOTE — Progress Notes (Signed)
Patient ID: Madeline Strickland, female   DOB: 01-16-1971, 41 y.o.   MRN: [id number]

## 2011-11-08 NOTE — Patient Instructions (Addendum)
Consider a Low Glycemic Index Diet and eating 6 smaller meals daily .  This frequent feeding stimulates your metabolism and the lower glycemic index foods will lower your blood sugars:   This is an example of my daily  "Low GI"  Diet:  All of the foods can be found at grocery stores and in bulk at [employer]  club   7 AM Breakfast:  Low carbohydrate Protein  Shakes (I recommend the EAS AdvantEdge "Carb Control" shakes  Or the low carb shakes by Atkins.   Both are available everywhere:  In  cases at [employer]Js  Or in 4 packs at grocery stores and pharmacies  2.5 carbs  (Alternative is  a toasted Arnold's Sandwhich Thin w/ peanut butter, a "Bagel Thin" with cream cheese and salmon) or  a scrambled egg burrito made with a low carb tortilla .  Avoid cereal and bananas, oatmeal too unless the old fashioned kind that takes 30-40 minutes to prepare.  the rest is overly processed, has minimal fiber, and loaded with carbohydrates!   10 AM: Protein bar by Atkins (the snack size, under 200 cal.  There are many varieties , available widely again or in bulk in limited varieties at BJs)  Other so called "protein bars" tend to be loaded with carbohydrates.  Remember, in food advertising, the word "energy" is synonymous for " carbohydrate."  Lunch: sandwich of [place], (or any lunchmeat or canned tuna), fresh avocado and cheese on a lower carbohydrate pita bread, flatbread, or tortilla . Ok to use mayonnaise. The bread is the only source or carbohydrate that can be decreased (Joseph's makes a pita bread and a flat bread  Are 50 cal and 4 net carbs ; Toufayan makes a low carb flatbread 100 cal and 9 net carbs  and  Mission makes a low carb whole wheat tortilla  210 cal and 6 net carbs)  3 PM:  Mid day :  Another proteintttt bThe brear,  Or a  cheese stick (100 cal, 0 carbs),  Or 1 ounce of  almonds, walnuts, pistachios, pecans, peanuts,  Macadamia nuts. Or a Dannon light n Fit greek yogurt, 80 cal 8 net carbs . Avoid "granola"; the  dried cranberries and raisins are loaded with carbohydrates.    6 PM  Dinner:  "mean and green:"  Meat/chicken/fish or a high protein legume; , with a green salad, and a low GI  Veggie (broccoli, cauliflower, green beans, spinach, brussel sprouts. Lima beans) : Avoid "Low fat dressings, Reyne Dumas and [address]! They are loaded with sugar! Instead use ranch, vinagrette,  Blue cheese, etc  9 PM snack : Breyer's "low carb" fudgsicle or  ice cream bar (Carb Smart line), or  Weight Watcher's ice cream bar , or anouther "no sugar added" ice cream; or another protein shake or a serving of fresh fruit with whipped cream (Avoid bananas, pineapple, grapes  and watermelon on a regular basis because they are high in sugar)   Remember that snack Substitutions should be less than 15 to 20 carbs  Per serving. Remember to subtract fiber grams to get the "net carbs."

## 2011-11-10 ENCOUNTER — Encounter: Payer: Self-pay | Admitting: Internal Medicine

## 2011-11-10 DIAGNOSIS — S0991XA Unspecified injury of ear, initial encounter: Secondary | ICD-10-CM | POA: Insufficient documentation

## 2011-11-10 NOTE — Assessment & Plan Note (Signed)
She did not rupture her eardrum. She scraped the the lateral wall of the ear canal. It has the stigmata of recent bleeding. Recommended using sterile petroleum jelly to moisten eardrum.  once it heals we'll try a steroid cream to help with the itching.

## 2011-11-10 NOTE — Assessment & Plan Note (Signed)
She is room for improvement with her low glycemic index diet. I have printed her a copy of my low glycemic index diet and counselled her on how to count carbs and her products to avoid any which brought to use. We have discussed referral to bariatric Center but she is not ready for this at this time. I've also recommended that she increase her exercise to an activity that increases her heartrate to 120 for the aerobic benefit .   I will see her back in 3 months.

## 2011-11-14 ENCOUNTER — Other Ambulatory Visit: Payer: Self-pay | Admitting: Internal Medicine

## 2011-12-05 ENCOUNTER — Other Ambulatory Visit: Payer: Self-pay | Admitting: Internal Medicine

## 2011-12-30 ENCOUNTER — Other Ambulatory Visit: Payer: Self-pay | Admitting: Internal Medicine

## 2012-01-09 ENCOUNTER — Other Ambulatory Visit: Payer: Self-pay | Admitting: Internal Medicine

## 2012-01-09 MED ORDER — ATORVASTATIN CALCIUM 40 MG PO TABS: 40.0000 mg | ORAL_TABLET | Freq: Every day | ORAL | Status: DC

## 2012-04-07 ENCOUNTER — Other Ambulatory Visit: Payer: Self-pay | Admitting: Internal Medicine

## 2012-04-07 ENCOUNTER — Other Ambulatory Visit: Payer: Self-pay

## 2012-04-07 MED ORDER — SPIRONOLACTONE 100 MG PO TABS: 100.0000 mg | ORAL_TABLET | Freq: Two times a day (BID) | ORAL | Status: DC

## 2012-04-07 MED ORDER — METOPROLOL SUCCINATE ER 50 MG PO TB24: 50.0000 mg | ORAL_TABLET | Freq: Every day | ORAL | Status: DC

## 2012-04-07 NOTE — Telephone Encounter (Signed)
Refill request for Aldactone 100 mg and Metoprol 50 mg sent to [facility]

## 2012-04-08 ENCOUNTER — Other Ambulatory Visit: Payer: Self-pay | Admitting: Internal Medicine

## 2012-04-08 MED ORDER — ATORVASTATIN CALCIUM 40 MG PO TABS: 40.0000 mg | ORAL_TABLET | Freq: Every day | ORAL | Status: DC

## 2012-04-08 NOTE — Telephone Encounter (Signed)
Drug Name- Atorvastatin 40 mg tablet  Directions-Take 1 tablet every day for cholesterol  Quantity-30

## 2012-04-08 NOTE — Telephone Encounter (Signed)
Refill request for Lipitor 40 mg # 30 3 R sent to [facility]

## 2012-04-17 ENCOUNTER — Other Ambulatory Visit: Payer: Self-pay | Admitting: Internal Medicine

## 2012-04-18 ENCOUNTER — Other Ambulatory Visit: Payer: Self-pay | Admitting: General Practice

## 2012-04-18 MED ORDER — ESOMEPRAZOLE MAGNESIUM 40 MG PO CPDR: DELAYED_RELEASE_CAPSULE | ORAL | Status: DC

## 2012-04-18 MED ORDER — ALPRAZOLAM 0.5 MG PO TABS: 0.5000 mg | ORAL_TABLET | Freq: Two times a day (BID) | ORAL | Status: DC | PRN

## 2012-04-18 MED ORDER — SPIRONOLACTONE 100 MG PO TABS: 100.0000 mg | ORAL_TABLET | Freq: Two times a day (BID) | ORAL | Status: DC

## 2012-04-18 NOTE — Telephone Encounter (Signed)
These were done in error so disregard. Shanda Bumps called correct amount in to pharmacy for [place].

## 2012-04-18 NOTE — Telephone Encounter (Signed)
This is Dr Melina Schools pt.  I don't usually give 60 tablets with refills (on Xanax).  I don't know if she gave you the ok for this many pills.  If not, I would want to clarify with her before prescribing this number of pills.  Need to change rx.

## 2012-04-25 ENCOUNTER — Telehealth: Payer: Self-pay | Admitting: Internal Medicine

## 2012-04-25 NOTE — Telephone Encounter (Signed)
Make an appt

## 2012-04-25 NOTE — Telephone Encounter (Signed)
Forward to Dr. Tullo 

## 2012-04-25 NOTE — Telephone Encounter (Signed)
Attempted to call patient back. She has lost 13 pounds in 10 days and is not trying to lose weight. Left message for patient to call office back.

## 2012-04-28 ENCOUNTER — Encounter: Payer: Self-pay | Admitting: Internal Medicine

## 2012-04-28 ENCOUNTER — Telehealth: Payer: Self-pay | Admitting: Internal Medicine

## 2012-04-28 ENCOUNTER — Ambulatory Visit (INDEPENDENT_AMBULATORY_CARE_PROVIDER_SITE_OTHER): Payer: BC Managed Care – PPO | Admitting: Internal Medicine

## 2012-04-28 VITALS — BP 118/76 | HR 79 | Temp 98.1°F | Resp 16 | Wt 232.5 lb

## 2012-04-28 DIAGNOSIS — IMO0002 Reserved for concepts with insufficient information to code with codable children: Secondary | ICD-10-CM

## 2012-04-28 DIAGNOSIS — E1169 Type 2 diabetes mellitus with other specified complication: Secondary | ICD-10-CM | POA: Insufficient documentation

## 2012-04-28 DIAGNOSIS — E1165 Type 2 diabetes mellitus with hyperglycemia: Secondary | ICD-10-CM

## 2012-04-28 DIAGNOSIS — R350 Frequency of micturition: Secondary | ICD-10-CM

## 2012-04-28 DIAGNOSIS — E119 Type 2 diabetes mellitus without complications: Secondary | ICD-10-CM

## 2012-04-28 LAB — POCT URINALYSIS DIPSTICK
Protein, UA: NEGATIVE
Spec Grav, UA: <=1.005
Urobilinogen, UA: 0.2

## 2012-04-28 MED ORDER — INSULIN GLARGINE 100 UNIT/ML ~~LOC~~ SOLN: 20.0000 [IU] | Freq: Every day | SUBCUTANEOUS | Status: DC

## 2012-04-28 MED ORDER — FREESTYLE SYSTEM KIT: 1.0000 | PACK | Status: DC | PRN

## 2012-04-28 NOTE — Telephone Encounter (Signed)
Patient has scheduled appointment for 04-28-2012
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
## 2012-04-28 NOTE — Telephone Encounter (Signed)
For this pt would you like a urine culture? Thank you

## 2012-04-28 NOTE — Addendum Note (Signed)
Addended by: Sherlene Shams on: 04/28/2012 06:06 PM   Modules accepted: Orders

## 2012-04-28 NOTE — Telephone Encounter (Signed)
No thanks  

## 2012-04-28 NOTE — Assessment & Plan Note (Signed)
New onset,  complicated by obesity and recent changes in vision. Patient is urged to see her regular optometrist for visual exam. I fasting sorry capillary blood glucose today was between 3 and 400. Hemoglobin A1c is pending. She has had prior trials of metformin for treatment of PCO S. which caused horrible diarrhea. We discussed the use of the basal insulin while titrating her oral medications. Glucometer and lancets prescriptions were sent her pharmacy. She will return for a nurse visit to receive instruction on how to use the glucometer and how to self administer 20 units of insulin. Low carbohydrate diet recommended.

## 2012-04-28 NOTE — Telephone Encounter (Signed)
rx for lantus insulin pen and a glucometer, sent to her pharmacy .  Please have her return for a n RN visit to learn how to check blood sugars and use the pen ASAP

## 2012-04-28 NOTE — Telephone Encounter (Signed)
Prior Authorization required for Nexium   Fax form from pharmacy is in physicians box.

## 2012-04-28 NOTE — Patient Instructions (Addendum)
You have developed diabetes .  Your Hgba1c is pending.    Depending on the results, we may have you come back to learn how to give you an insulin dose   This is  my version of a  "Low GI"  Diet:  All of the foods can be found at grocery stores and in bulk at [employer].  The Atkins protein bars and shakes are available in more varieties at [employer], [employer] and [employer].     7 AM Breakfast:  Low carbohydrate Protein  Shakes (I recommend the EAS AdvantEdge "Carb Control" shakes  Or the low carb shakes by Atkins.   Both are available everywhere:  In  cases at BJs  Or in 4 packs at grocery stores and pharmacies  2.5 carbs  (Alternative is  a toasted Arnold's Sandwhich Thin w/ peanut butter, a "Bagel Thin" with cream cheese and salmon) or  a scrambled egg burrito made with a low carb tortilla .  Avoid cereal and bananas, oatmeal too unless you are cooking the old fashioned kind that takes 30-40 minutes to prepare.  the rest is overly processed, has minimal fiber, and is loaded with carbohydrates!   10 AM: Protein bar by Atkins (the snack size, under 200 cal).  There are many varieties , available widely again or in bulk in limited varieties at BJs)  Other so called "protein bars" tend to be loaded with carbohydrates.  Remember, in food advertising, the word "energy" is synonymous for " carbohydrate."  Lunch: sandwich of [place], (or any lunchmeat, grilled meat or canned tuna), fresh avocado, mayonnaise  and cheese on a lower carbohydrate pita bread, flatbread, or tortilla . Ok to use regular mayonnaise. The bread is the only source or carbohydrate that can be decreased (Joseph's makes a pita bread and a flat bread that are 50 cal and 4 net carbs ; Toufayan makes a low carb flatbread that's 100 cal and 9 net carbs  and  Mission makes a low carb whole wheat tortilla  That is 210 cal and 6 net carbs)  3 PM:  Mid day :  Another protein bar,  Or a  cheese stick (100 cal, 0 carbs),  Or 1 ounce of  almonds,  walnuts, pistachios, pecans, peanuts,  Macadamia nuts. Or a Dannon light n Fit greek yogurt, 80 cal 8 net carbs . Avoid "granola"; the dried cranberries and raisins are loaded with carbohydrates. Mixed nuts ok if no raisins or cranberries or dried fruit.      6 PM  Dinner:  "mean and green:"  Meat/chicken/fish or a high protein legume; , with a green salad, and a low GI  Veggie (broccoli, cauliflower, green beans, spinach, brussel sprouts. Lima beans) : Avoid "Low fat dressings, as well as Reyne Dumas and [address]! They are loaded with sugar! Instead use ranch, vinagrette,  Blue cheese, etc.  There is a low carb pasta by Dreamfield's available at [facility] that is acceptable and tastes great. Try Michel Angelo's chicken piccata, or eggplant or chicken parmigiana over low carb pasta. The chicken dish is 0 carbs, and can be found in frozen section at BJs and [employer]. Also try [employer] "Carnitas" (pulled pork, no sauce,  0 carbs) and his pot roast.   both are in the refrigerated section at BJs   9 PM snack : Breyer's "low carb" fudgsicle or  ice cream bar (Carb Smart line), or  Weight Watcher's ice cream bar , or another "no sugar  added" ice cream;a serving of fresh berries/cherries with whipped cream (Avoid bananas, pineapple, grapes  and watermelon on a regular basis because they are high in sugar)   Remember that snack Substitutions should be less than 15 to 20 carbs  Per serving. Remember to subtract fiber grams and sugar alcohols to get the "net carbs."

## 2012-04-29 ENCOUNTER — Encounter: Payer: Self-pay | Admitting: Internal Medicine

## 2012-04-29 ENCOUNTER — Other Ambulatory Visit: Payer: Self-pay | Admitting: General Practice

## 2012-04-29 ENCOUNTER — Other Ambulatory Visit: Payer: Self-pay | Admitting: Internal Medicine

## 2012-04-29 ENCOUNTER — Ambulatory Visit: Payer: BC Managed Care – PPO

## 2012-04-29 LAB — COMPREHENSIVE METABOLIC PANEL
CO2: 28 mEq/L (ref 19–32)
Creatinine, Ser: 1.4 mg/dL — ABNORMAL HIGH (ref 0.4–1.2)
GFR: 54.01 mL/min — ABNORMAL LOW (ref >60.00)
Glucose, Bld: 497 mg/dL — ABNORMAL HIGH (ref 70–99)
Total Bilirubin: 0.6 mg/dL (ref 0.3–1.2)

## 2012-04-29 LAB — MICROALBUMIN / CREATININE URINE RATIO
Creatinine,U: 65.8 mg/dL
Microalb Creat Ratio: 1.1 mg/g (ref 0.0–30.0)
Microalb, Ur: 0.7 mg/dL (ref 0.0–1.9)

## 2012-04-29 MED ORDER — INSULIN PEN NEEDLE 31G X 5 MM MISC: Status: DC

## 2012-04-29 MED ORDER — FLUCONAZOLE 150 MG PO TABS: 150.0000 mg | ORAL_TABLET | Freq: Every day | ORAL | Status: DC

## 2012-04-29 MED ORDER — SAFETY LANCETS 28G MISC: 1.0000 | Status: DC | PRN

## 2012-04-29 MED ORDER — GLUCOSE BLOOD VI STRP: ORAL_STRIP | Status: DC

## 2012-04-29 NOTE — Telephone Encounter (Signed)
Insulin supplies sent in for pt.

## 2012-05-02 ENCOUNTER — Other Ambulatory Visit: Payer: Self-pay | Admitting: Internal Medicine

## 2012-05-02 NOTE — Telephone Encounter (Signed)
[facility] needing prior authorization on  esomeprazole (NEXIUM) 40 MG capsule  # 30  1- [id number]  Faxed form in physicians box

## 2012-05-02 NOTE — Telephone Encounter (Signed)
PA needed for Nexium. Insurance company called waiting on fax.

## 2012-05-05 ENCOUNTER — Encounter: Payer: Self-pay | Admitting: General Practice

## 2012-05-05 ENCOUNTER — Encounter: Payer: Self-pay | Admitting: Internal Medicine

## 2012-05-05 ENCOUNTER — Telehealth: Payer: Self-pay | Admitting: *Deleted

## 2012-05-05 NOTE — Telephone Encounter (Signed)
Called [phone] for prior authorization, spoke to laurie she is faxing the form now the case number is [id number]

## 2012-05-05 NOTE — Telephone Encounter (Signed)
Still no fax from [employer].

## 2012-05-06 ENCOUNTER — Other Ambulatory Visit: Payer: Self-pay | Admitting: Internal Medicine

## 2012-05-06 MED ORDER — GLIPIZIDE 5 MG PO TABS: 5.0000 mg | ORAL_TABLET | Freq: Two times a day (BID) | ORAL | Status: DC

## 2012-05-06 MED ORDER — METFORMIN HCL 500 MG PO TABS: 500.0000 mg | ORAL_TABLET | Freq: Two times a day (BID) | ORAL | Status: DC

## 2012-05-06 MED ORDER — ESOMEPRAZOLE MAGNESIUM 40 MG PO CPDR: DELAYED_RELEASE_CAPSULE | ORAL | Status: DC

## 2012-05-06 NOTE — Telephone Encounter (Signed)
PA form filled out and faxed to insurance company on 05/06/12

## 2012-05-06 NOTE — Addendum Note (Signed)
Addended by: Jackson Latino on: 05/06/2012 12:31 PM   Modules accepted: Orders

## 2012-05-06 NOTE — Telephone Encounter (Signed)
PA for NExium approved on 05/06/12. Approved from 04/23/12 until 05/14/13.

## 2012-05-07 MED ORDER — METFORMIN HCL ER 750 MG PO TB24: 750.0000 mg | ORAL_TABLET | Freq: Every day | ORAL | Status: DC

## 2012-05-13 ENCOUNTER — Encounter: Payer: Self-pay | Admitting: Internal Medicine

## 2012-05-31 ENCOUNTER — Encounter: Payer: Self-pay | Admitting: Internal Medicine

## 2012-06-07 ENCOUNTER — Other Ambulatory Visit: Payer: Self-pay

## 2012-06-07 ENCOUNTER — Encounter: Payer: Self-pay | Admitting: Internal Medicine

## 2012-07-02 ENCOUNTER — Other Ambulatory Visit: Payer: Self-pay | Admitting: Internal Medicine

## 2012-07-03 NOTE — Telephone Encounter (Signed)
Med filled.  

## 2012-07-22 ENCOUNTER — Encounter: Payer: Self-pay | Admitting: Internal Medicine

## 2012-07-22 ENCOUNTER — Ambulatory Visit (INDEPENDENT_AMBULATORY_CARE_PROVIDER_SITE_OTHER): Payer: BC Managed Care – PPO | Admitting: Internal Medicine

## 2012-07-22 VITALS — BP 120/72 | HR 71 | Temp 98.7°F | Resp 16 | Wt 230.2 lb

## 2012-07-22 DIAGNOSIS — E1169 Type 2 diabetes mellitus with other specified complication: Secondary | ICD-10-CM

## 2012-07-22 DIAGNOSIS — E1149 Type 2 diabetes mellitus with other diabetic neurological complication: Secondary | ICD-10-CM

## 2012-07-22 DIAGNOSIS — E785 Hyperlipidemia, unspecified: Secondary | ICD-10-CM

## 2012-07-22 DIAGNOSIS — E669 Obesity, unspecified: Secondary | ICD-10-CM

## 2012-07-22 DIAGNOSIS — E119 Type 2 diabetes mellitus without complications: Secondary | ICD-10-CM

## 2012-07-22 MED ORDER — ACYCLOVIR 400 MG PO TABS: 400.0000 mg | ORAL_TABLET | ORAL | Status: DC

## 2012-07-22 MED ORDER — INSULIN GLARGINE 100 UNIT/ML ~~LOC~~ SOLN: 2.0000 [IU] | Freq: Every day | SUBCUTANEOUS | Status: DC
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
## 2012-07-22 NOTE — Patient Instructions (Signed)
You have done fantastic!  You do not need the Lantus or any medications anymore

## 2012-07-24 ENCOUNTER — Encounter: Payer: Self-pay | Admitting: Internal Medicine

## 2012-07-24 DIAGNOSIS — E785 Hyperlipidemia, unspecified: Secondary | ICD-10-CM | POA: Insufficient documentation

## 2012-07-24 DIAGNOSIS — E1169 Type 2 diabetes mellitus with other specified complication: Secondary | ICD-10-CM | POA: Insufficient documentation

## 2012-07-24 NOTE — Assessment & Plan Note (Signed)
Remarkable control over the last 3 months achieved with restricted carbohydrate diet and the use of insulin. She is now only using one or 2 units of insulin daily and only on the days where she has a discharge. I recommended that she return next week for repeat A1c and blood work. Her BMI is still 35 and I recommended that she begin an exercise program with a personal trainer to guide her efforts.

## 2012-07-24 NOTE — Assessment & Plan Note (Signed)
Managed with Lipitor 40 mg daily. She is high risk for coronary artery disease given her history of tobacco abuse, and new-onset diabetes, sedentary lifestyle, and family history.

## 2012-07-24 NOTE — Assessment & Plan Note (Signed)
She has not lost any weight in the last 3 months despite a very low carb diet. I recommended that she increase her activity and using a personal trainer to help guide her efforts to keep her foc improving her cardiac condition.

## 2012-07-29 ENCOUNTER — Encounter: Payer: Self-pay | Admitting: Internal Medicine

## 2012-07-29 ENCOUNTER — Other Ambulatory Visit (INDEPENDENT_AMBULATORY_CARE_PROVIDER_SITE_OTHER): Payer: BC Managed Care – PPO

## 2012-07-29 DIAGNOSIS — E1149 Type 2 diabetes mellitus with other diabetic neurological complication: Secondary | ICD-10-CM

## 2012-07-29 LAB — LIPID PANEL
Cholesterol: 93 mg/dL (ref 0–200)
HDL: 24.1 mg/dL — ABNORMAL LOW (ref >39.00)
LDL Cholesterol: 57 mg/dL (ref 0–99)
Total CHOL/HDL Ratio: 4
Triglycerides: 60 mg/dL (ref 0.0–149.0)
VLDL: 12 mg/dL (ref 0.0–40.0)

## 2012-07-29 LAB — COMPREHENSIVE METABOLIC PANEL
AST: 23 U/L (ref 0–37)
Albumin: 3.7 g/dL (ref 3.5–5.2)
BUN: 14 mg/dL (ref 6–23)
CO2: 24 mEq/L (ref 19–32)
Calcium: 8.9 mg/dL (ref 8.4–10.5)
Chloride: 105 mEq/L (ref 96–112)
Creatinine, Ser: 1 mg/dL (ref 0.4–1.2)
GFR: 74.14 mL/min (ref >60.00)
Glucose, Bld: 94 mg/dL (ref 70–99)
Potassium: 3.9 mEq/L (ref 3.5–5.1)

## 2012-07-29 LAB — MICROALBUMIN / CREATININE URINE RATIO: Creatinine,U: 147.4 mg/dL

## 2012-08-03 ENCOUNTER — Other Ambulatory Visit: Payer: Self-pay | Admitting: Internal Medicine

## 2012-08-04 NOTE — Telephone Encounter (Signed)
Med filled.  

## 2012-08-07 ENCOUNTER — Encounter: Payer: Self-pay | Admitting: Internal Medicine

## 2012-08-08 ENCOUNTER — Other Ambulatory Visit: Payer: Self-pay | Admitting: Internal Medicine

## 2012-08-08 MED ORDER — FUROSEMIDE 20 MG PO TABS: 20.0000 mg | ORAL_TABLET | Freq: Every day | ORAL | Status: DC

## 2012-08-27 ENCOUNTER — Other Ambulatory Visit: Payer: Self-pay | Admitting: Internal Medicine

## 2012-08-28 NOTE — Telephone Encounter (Signed)
Spoke with [facility], has numerous refills available. Refill denied.

## 2012-09-10 ENCOUNTER — Other Ambulatory Visit: Payer: Self-pay | Admitting: Internal Medicine

## 2012-09-11 NOTE — Telephone Encounter (Signed)
Rx sent to pharmacy by escript  

## 2012-11-03 ENCOUNTER — Other Ambulatory Visit: Payer: Self-pay | Admitting: Internal Medicine

## 2012-11-04 NOTE — Telephone Encounter (Signed)
Rx faxed to pharmacy  

## 2012-11-05 ENCOUNTER — Other Ambulatory Visit: Payer: Self-pay | Admitting: Internal Medicine

## 2012-11-07 ENCOUNTER — Ambulatory Visit: Payer: BC Managed Care – PPO | Admitting: Internal Medicine

## 2012-11-11 ENCOUNTER — Ambulatory Visit: Payer: BC Managed Care – PPO | Admitting: Internal Medicine

## 2012-11-11 ENCOUNTER — Encounter: Payer: Self-pay | Admitting: Internal Medicine

## 2012-11-11 ENCOUNTER — Telehealth: Payer: Self-pay | Admitting: Internal Medicine

## 2012-11-11 NOTE — Telephone Encounter (Signed)
Pt came in late for her appointment today very upset because she had to reschedule her appointment. .  Pt stated she has to wait 20 min for dr Darrick Huntsman why does she have to r/s because she was 12 minutes. I tried to explain to her that Dr Darrick Huntsman has back to back appointments today and would be glad to r/s her appointment.  I also apologized to pt for any inconvenice she had, pt stated that no you are not sorry.  Patient didn't want to r/s appointment with dr Darrick Huntsman pt wanted to know if she could have lab done.  Pt wanted to know why if she could have labs done this morning again i explained to pt that there were no lab orders in system so i could not make appointment and that i would have to send note back for Dr Darrick Huntsman to put labs in.  I told her that dr Darrick Huntsman was in seeing patient at this time and i could not interrupt to have her put lab orders in.  Can pt have labs done please advise

## 2012-11-11 NOTE — Telephone Encounter (Signed)
Patient did not reschedule an appointment would you like for me to call her?

## 2012-11-11 NOTE — Telephone Encounter (Signed)
Madeline Strickland,   I often do not see these types of messages until hours later.  In the future,  Please ask Olegario Messier to interrupt me to ask me while the patient is still waiting, so her trip here wasn't a total waste of time. . thanks

## 2012-11-11 NOTE — Telephone Encounter (Signed)
I am sorry that you were not able to be seen today.  I understand your frustration at having to be rescheduled when you frequently wait 20 minutes past your appt time to see me.  The reality is : I have patients lined up all morning and all afternoon,  usually.  I very rarely have a cancellation,  And I very rarely end a visit after the allotted 15 minutes if the patient requires additional time to manage his or her acute issues. This is why I often run  late. it is at the discretion of the physician whether to reschedule the late patient or to go ahead and see them when they show up. If there is no patient waiting to be seen,  I will accomodate the late patient.  But if a patient shows up late, and the next patient has gotten here on time, it is not fair to the patient who was here on time to wait an additional 20 to 30 minutes because the previous patient showed up late.

## 2012-11-12 ENCOUNTER — Encounter: Payer: Self-pay | Admitting: Internal Medicine

## 2012-11-13 NOTE — Telephone Encounter (Signed)
Called patient no answer left message for patient to return my call.  

## 2012-11-20 NOTE — Telephone Encounter (Signed)
Left detailed message apologizing for the inconvenience she was caused and to try and reschedule labs and appointment no answer ask patient to please return my call. Third attempt made.

## 2012-12-06 ENCOUNTER — Other Ambulatory Visit: Payer: Self-pay | Admitting: Internal Medicine

## 2012-12-08 ENCOUNTER — Other Ambulatory Visit: Payer: Self-pay | Admitting: *Deleted

## 2012-12-08 ENCOUNTER — Other Ambulatory Visit: Payer: Self-pay | Admitting: Internal Medicine

## 2012-12-09 NOTE — Telephone Encounter (Signed)
Refill completed 12/06/12

## 2013-01-04 ENCOUNTER — Other Ambulatory Visit: Payer: Self-pay | Admitting: Internal Medicine

## 2013-01-04 DIAGNOSIS — E039 Hypothyroidism, unspecified: Secondary | ICD-10-CM

## 2013-01-04 DIAGNOSIS — E119 Type 2 diabetes mellitus without complications: Secondary | ICD-10-CM

## 2013-01-04 DIAGNOSIS — R5381 Other malaise: Secondary | ICD-10-CM

## 2013-01-05 NOTE — Telephone Encounter (Signed)
Okay to refill? No recent TSH

## 2013-01-06 NOTE — Telephone Encounter (Signed)
30 day refill only,  Needs CMET, TSH a1c  and fasting lipids prior to any more refills  Will order labs.

## 2013-02-26 ENCOUNTER — Other Ambulatory Visit: Payer: Self-pay

## 2013-03-12 ENCOUNTER — Other Ambulatory Visit: Payer: Self-pay | Admitting: Internal Medicine

## 2013-03-20 ENCOUNTER — Encounter: Payer: Self-pay | Admitting: *Deleted

## 2013-03-20 ENCOUNTER — Telehealth: Payer: Self-pay | Admitting: *Deleted

## 2013-03-20 DIAGNOSIS — E1169 Type 2 diabetes mellitus with other specified complication: Secondary | ICD-10-CM

## 2013-03-20 DIAGNOSIS — E079 Disorder of thyroid, unspecified: Secondary | ICD-10-CM

## 2013-03-20 NOTE — Telephone Encounter (Signed)
Pt is coming in for labs 12.01.2014, what labs and dx?  

## 2013-03-23 ENCOUNTER — Other Ambulatory Visit (INDEPENDENT_AMBULATORY_CARE_PROVIDER_SITE_OTHER): Payer: BC Managed Care – PPO

## 2013-03-23 ENCOUNTER — Ambulatory Visit (INDEPENDENT_AMBULATORY_CARE_PROVIDER_SITE_OTHER): Payer: BC Managed Care – PPO | Admitting: Internal Medicine

## 2013-03-23 ENCOUNTER — Encounter: Payer: Self-pay | Admitting: Internal Medicine

## 2013-03-23 VITALS — BP 112/80 | HR 75 | Temp 98.8°F | Resp 12 | Ht 68.0 in | Wt 222.8 lb

## 2013-03-23 DIAGNOSIS — F17200 Nicotine dependence, unspecified, uncomplicated: Secondary | ICD-10-CM

## 2013-03-23 DIAGNOSIS — J45901 Unspecified asthma with (acute) exacerbation: Secondary | ICD-10-CM

## 2013-03-23 DIAGNOSIS — E119 Type 2 diabetes mellitus without complications: Secondary | ICD-10-CM

## 2013-03-23 DIAGNOSIS — E079 Disorder of thyroid, unspecified: Secondary | ICD-10-CM

## 2013-03-23 DIAGNOSIS — K121 Other forms of stomatitis: Secondary | ICD-10-CM | POA: Insufficient documentation

## 2013-03-23 DIAGNOSIS — I10 Essential (primary) hypertension: Secondary | ICD-10-CM

## 2013-03-23 DIAGNOSIS — K1370 Unspecified lesions of oral mucosa: Secondary | ICD-10-CM

## 2013-03-23 DIAGNOSIS — E669 Obesity, unspecified: Secondary | ICD-10-CM

## 2013-03-23 DIAGNOSIS — Z720 Tobacco use: Secondary | ICD-10-CM

## 2013-03-23 DIAGNOSIS — E1169 Type 2 diabetes mellitus with other specified complication: Secondary | ICD-10-CM

## 2013-03-23 LAB — COMPREHENSIVE METABOLIC PANEL
Albumin: 3.8 g/dL (ref 3.5–5.2)
BUN: 16 mg/dL (ref 6–23)
CO2: 25 mEq/L (ref 19–32)
GFR: 70.02 mL/min (ref >60.00)
Glucose, Bld: 126 mg/dL — ABNORMAL HIGH (ref 70–99)
Potassium: 4.1 mEq/L (ref 3.5–5.1)
Sodium: 137 mEq/L (ref 135–145)
Total Protein: 7.8 g/dL (ref 6.0–8.3)

## 2013-03-23 LAB — LIPID PANEL
Cholesterol: 111 mg/dL (ref 0–200)
LDL Cholesterol: 62 mg/dL (ref 0–99)
Triglycerides: 76 mg/dL (ref 0.0–149.0)

## 2013-03-23 LAB — TSH: TSH: 1.04 u[IU]/mL (ref 0.35–5.50)

## 2013-03-23 MED ORDER — SPIRONOLACTONE 100 MG PO TABS: 100.0000 mg | ORAL_TABLET | Freq: Two times a day (BID) | ORAL | Status: DC

## 2013-03-23 MED ORDER — ALBUTEROL SULFATE 1.25 MG/3ML IN NEBU: 1.0000 | INHALATION_SOLUTION | Freq: Four times a day (QID) | RESPIRATORY_TRACT | Status: DC | PRN

## 2013-03-23 MED ORDER — CHLORHEXIDINE GLUCONATE 0.12 % MT SOLN: 15.0000 mL | Freq: Two times a day (BID) | OROMUCOSAL | Status: DC

## 2013-03-23 MED ORDER — ATORVASTATIN CALCIUM 40 MG PO TABS: ORAL_TABLET | ORAL | Status: DC

## 2013-03-23 MED ORDER — FLUTICASONE-SALMETEROL 250-50 MCG/DOSE IN AEPB: 1.0000 | INHALATION_SPRAY | Freq: Two times a day (BID) | RESPIRATORY_TRACT | Status: DC

## 2013-03-23 MED ORDER — ALBUTEROL SULFATE HFA 108 (90 BASE) MCG/ACT IN AERS: 2.0000 | INHALATION_SPRAY | Freq: Four times a day (QID) | RESPIRATORY_TRACT | Status: DC | PRN

## 2013-03-23 MED ORDER — PHENTERMINE HCL 37.5 MG PO TABS: ORAL_TABLET | ORAL | Status: DC

## 2013-03-23 MED ORDER — AMOXICILLIN-POT CLAVULANATE 875-125 MG PO TABS: 1.0000 | ORAL_TABLET | Freq: Two times a day (BID) | ORAL | Status: DC

## 2013-03-23 NOTE — Assessment & Plan Note (Signed)
Well controlled on current regimen. Renal function stable, no changes today.  Lab Results  Component Value Date   CREATININE 1.1 03/23/2013

## 2013-03-23 NOTE — Assessment & Plan Note (Signed)
Well-controlled on current medications.  hemoglobin A1c has been consistently less than 7.0 . sHe is up-to-date on eye exams and his foot exam is normal. SHe is on the appropriate medications. Lab Results  Component Value Date   HGBA1C 6.2 03/23/2013   Lab Results  Component Value Date   MICROALBUR 2.9* 03/23/2013
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
## 2013-03-23 NOTE — Assessment & Plan Note (Signed)
trila of augmentin x 1 week and peridex.  If not resolved,  Needs to see dentistry to rule out malignancy.

## 2013-03-23 NOTE — Patient Instructions (Addendum)
I am starting you on an oral antibiotic and a medicated rinse but if the ulcer does not resolve this time you need ot be seen by a dentist.  Please take a probiotic ( Align, Floraque or Culturelle) while you are on the antibiotic to prevent a serious antibiotic associated diarrhea  Called clostirudium dificile colitis and a vaginal yeast infection   Dr. Desma Maxim is an excellent dentist . Office on [address] in front of [address] [place]  You need to quit smoking!!  Start the Advair twice daily on a regular basis  Trial of phentermine for weight loss,  Max 3 months  GT your vital signs checked a week after starting  STOP IMMEDIATELY if it makes your heart race

## 2013-03-23 NOTE — Assessment & Plan Note (Signed)
The patient was counseled on the dangers of tobacco use, and was advised to quit.  Reviewed strategies to maximize success, including removing cigarettes and smoking materials from environment. 

## 2013-03-23 NOTE — Assessment & Plan Note (Signed)
Recommended resuming advair bid.

## 2013-03-23 NOTE — Progress Notes (Signed)
Pre-visit discussion using our clinic review tool. No additional management support is needed unless otherwise documented below in the visit note.  

## 2013-03-23 NOTE — Assessment & Plan Note (Signed)
I have addressed  BMI and recommended wt loss of 10% of body weigh over the next 6 months using a low glycemic index diet and regular exercise a minimum of 5 days per week. Trial of phentermine

## 2013-03-24 ENCOUNTER — Encounter: Payer: Self-pay | Admitting: Internal Medicine

## 2013-03-30 ENCOUNTER — Encounter: Payer: Self-pay | Admitting: Internal Medicine

## 2013-04-06 ENCOUNTER — Encounter: Payer: Self-pay | Admitting: Internal Medicine

## 2013-04-06 DIAGNOSIS — K521 Toxic gastroenteritis and colitis: Secondary | ICD-10-CM

## 2013-04-13 ENCOUNTER — Other Ambulatory Visit: Payer: Self-pay | Admitting: Internal Medicine

## 2013-04-20 ENCOUNTER — Encounter: Payer: Self-pay | Admitting: Internal Medicine

## 2013-04-27 ENCOUNTER — Other Ambulatory Visit: Payer: Self-pay | Admitting: Internal Medicine

## 2013-05-25 ENCOUNTER — Other Ambulatory Visit: Payer: Self-pay | Admitting: Internal Medicine

## 2013-05-28 ENCOUNTER — Other Ambulatory Visit: Payer: Self-pay | Admitting: *Deleted

## 2013-05-28 NOTE — Telephone Encounter (Signed)
Last visit 03/23/13, ok refill?

## 2013-05-29 MED ORDER — ALPRAZOLAM 0.5 MG PO TABS: ORAL_TABLET | ORAL | Status: DC

## 2013-05-29 NOTE — Telephone Encounter (Signed)
Rx faxed to pharmacy  

## 2013-06-10 ENCOUNTER — Encounter: Payer: Self-pay | Admitting: Internal Medicine

## 2013-06-12 ENCOUNTER — Encounter: Payer: Self-pay | Admitting: Internal Medicine

## 2013-06-13 MED ORDER — PHENTERMINE HCL 37.5 MG PO TABS: ORAL_TABLET | ORAL | Status: DC

## 2013-06-17 NOTE — Telephone Encounter (Signed)
Gave patient hard script

## 2013-06-23 ENCOUNTER — Encounter: Payer: Self-pay | Admitting: Internal Medicine

## 2013-06-27 ENCOUNTER — Encounter: Payer: Self-pay | Admitting: Internal Medicine

## 2013-06-27 DIAGNOSIS — E039 Hypothyroidism, unspecified: Secondary | ICD-10-CM

## 2013-06-29 MED ORDER — LEVOTHYROXINE SODIUM 175 MCG PO TABS: ORAL_TABLET | ORAL | Status: DC

## 2013-06-29 MED ORDER — ESOMEPRAZOLE MAGNESIUM 40 MG PO CPDR: DELAYED_RELEASE_CAPSULE | ORAL | Status: DC

## 2013-06-29 MED ORDER — GLUCOSE BLOOD VI STRP: ORAL_STRIP | Status: DC

## 2013-06-29 MED ORDER — INSULIN PEN NEEDLE 31G X 5 MM MISC: Status: DC

## 2013-06-29 MED ORDER — SAFETY LANCETS 28G MISC: Status: DC

## 2013-06-30 MED ORDER — FLUTICASONE-SALMETEROL 250-50 MCG/DOSE IN AEPB: 1.0000 | INHALATION_SPRAY | Freq: Two times a day (BID) | RESPIRATORY_TRACT | Status: DC

## 2013-06-30 NOTE — Telephone Encounter (Signed)
Refill sent for ADvair

## 2013-07-16 ENCOUNTER — Other Ambulatory Visit: Payer: BC Managed Care – PPO

## 2013-07-16 MED ORDER — SAFETY LANCETS 28G MISC
Status: DC
Start: 2013-07-16 — End: 2013-07-21

## 2013-07-16 MED ORDER — LEVOTHYROXINE SODIUM 175 MCG PO TABS: ORAL_TABLET | ORAL | Status: DC

## 2013-07-16 MED ORDER — GLUCOSE BLOOD VI STRP: ORAL_STRIP | Status: DC

## 2013-07-16 MED ORDER — INSULIN PEN NEEDLE 31G X 5 MM MISC: Status: DC

## 2013-07-16 NOTE — Addendum Note (Signed)
Addended by: Henrene PastorAVIS, KATHY R on: 07/16/2013 11:10 AM   Modules accepted: Orders

## 2013-07-17 ENCOUNTER — Encounter: Payer: Self-pay | Admitting: Internal Medicine

## 2013-07-20 ENCOUNTER — Telehealth: Payer: Self-pay | Admitting: *Deleted

## 2013-07-20 NOTE — Telephone Encounter (Signed)
Received PA request form for [facility] placed in Dr.tullo Box

## 2013-07-21 ENCOUNTER — Telehealth: Payer: Self-pay | Admitting: Internal Medicine

## 2013-07-21 DIAGNOSIS — Z0279 Encounter for issue of other medical certificate: Secondary | ICD-10-CM

## 2013-07-21 MED ORDER — INSULIN PEN NEEDLE 31G X 5 MM MISC: 1.0000 "application " | Freq: Three times a day (TID) | Status: DC

## 2013-07-21 MED ORDER — ONETOUCH ULTRA SYSTEM W/DEVICE KIT: 1.0000 | PACK | Freq: Once | Status: DC

## 2013-07-21 MED ORDER — ONETOUCH ULTRASOFT LANCETS MISC: Status: DC

## 2013-07-21 NOTE — Telephone Encounter (Signed)
[facility] pen [facility].  States they do not see that pt is on any insulin that requires pen needles.  Asking for confirmation.

## 2013-07-22 ENCOUNTER — Ambulatory Visit: Payer: BC Managed Care – PPO | Admitting: Internal Medicine

## 2013-07-22 ENCOUNTER — Telehealth: Payer: Self-pay | Admitting: Internal Medicine

## 2013-07-22 NOTE — Telephone Encounter (Signed)
Her insurance will no longer pay for her nexium They are requiring a switch to a covered alternative herapy drug called femring .  Due to the volume of these mandated changes we have been inundated with for every patient , we simply cannot devote the time (20 minutes per request per patient) to do prior authorizations unless it is absolutely necessary.  Therefore I would like her to try the new medication before making any requests for prior authorizations.

## 2013-07-22 NOTE — Telephone Encounter (Signed)
Pharmacy notified.

## 2013-07-23 ENCOUNTER — Ambulatory Visit (INDEPENDENT_AMBULATORY_CARE_PROVIDER_SITE_OTHER): Payer: BC Managed Care – PPO | Admitting: Internal Medicine

## 2013-07-23 ENCOUNTER — Telehealth: Payer: Self-pay | Admitting: Internal Medicine

## 2013-07-23 VITALS — BP 108/78 | HR 63 | Temp 98.5°F | Resp 16 | Wt 229.5 lb

## 2013-07-23 DIAGNOSIS — J0100 Acute maxillary sinusitis, unspecified: Secondary | ICD-10-CM

## 2013-07-23 DIAGNOSIS — E1169 Type 2 diabetes mellitus with other specified complication: Secondary | ICD-10-CM

## 2013-07-23 DIAGNOSIS — R509 Fever, unspecified: Secondary | ICD-10-CM

## 2013-07-23 DIAGNOSIS — E079 Disorder of thyroid, unspecified: Secondary | ICD-10-CM

## 2013-07-23 DIAGNOSIS — Z720 Tobacco use: Secondary | ICD-10-CM

## 2013-07-23 DIAGNOSIS — I10 Essential (primary) hypertension: Secondary | ICD-10-CM

## 2013-07-23 DIAGNOSIS — J019 Acute sinusitis, unspecified: Secondary | ICD-10-CM

## 2013-07-23 DIAGNOSIS — E669 Obesity, unspecified: Secondary | ICD-10-CM

## 2013-07-23 DIAGNOSIS — F17200 Nicotine dependence, unspecified, uncomplicated: Secondary | ICD-10-CM

## 2013-07-23 DIAGNOSIS — E119 Type 2 diabetes mellitus without complications: Secondary | ICD-10-CM

## 2013-07-23 LAB — CBC WITH DIFFERENTIAL/PLATELET
BASOS ABS: 0.1 10*3/uL (ref 0.0–0.1)
Basophils Relative: 1 % (ref 0.0–3.0)
EOS ABS: 0.2 10*3/uL (ref 0.0–0.7)
Eosinophils Relative: 1.4 % (ref 0.0–5.0)
HCT: 38.7 % (ref 36.0–46.0)
Hemoglobin: 12.7 g/dL (ref 12.0–15.0)
LYMPHS PCT: 16.6 % (ref 12.0–46.0)
Lymphs Abs: 2.4 10*3/uL (ref 0.7–4.0)
MCHC: 32.8 g/dL (ref 30.0–36.0)
MCV: 90.4 fl (ref 78.0–100.0)
Monocytes Absolute: 0.6 10*3/uL (ref 0.1–1.0)
Monocytes Relative: 3.9 % (ref 3.0–12.0)
Neutro Abs: 11.3 10*3/uL — ABNORMAL HIGH (ref 1.4–7.7)
Neutrophils Relative %: 77.1 % — ABNORMAL HIGH (ref 43.0–77.0)
PLATELETS: 329 10*3/uL (ref 150.0–400.0)
RBC: 4.28 Mil/uL (ref 3.87–5.11)
RDW: 13.5 % (ref 11.5–14.6)
WBC: 14.6 10*3/uL — ABNORMAL HIGH (ref 4.5–10.5)

## 2013-07-23 LAB — COMPREHENSIVE METABOLIC PANEL
ALT: 23 U/L (ref 0–35)
AST: 18 U/L (ref 0–37)
Albumin: 3.9 g/dL (ref 3.5–5.2)
Alkaline Phosphatase: 119 U/L — ABNORMAL HIGH (ref 39–117)
BUN: 10 mg/dL (ref 6–23)
CHLORIDE: 102 meq/L (ref 96–112)
CO2: 25 mEq/L (ref 19–32)
Calcium: 9.3 mg/dL (ref 8.4–10.5)
Creatinine, Ser: 1 mg/dL (ref 0.4–1.2)
GFR: 75.48 mL/min (ref >60.00)
Glucose, Bld: 116 mg/dL — ABNORMAL HIGH (ref 70–99)
Potassium: 4.3 mEq/L (ref 3.5–5.1)
Sodium: 134 mEq/L — ABNORMAL LOW (ref 135–145)
Total Bilirubin: 0.5 mg/dL (ref 0.3–1.2)
Total Protein: 7.7 g/dL (ref 6.0–8.3)

## 2013-07-23 LAB — HEMOGLOBIN A1C: Hgb A1c MFr Bld: 6.4 % (ref 4.6–6.5)

## 2013-07-23 LAB — MICROALBUMIN / CREATININE URINE RATIO
CREATININE, U: 287.3 mg/dL
Microalb Creat Ratio: 0.4 mg/g (ref 0.0–30.0)
Microalb, Ur: 1.1 mg/dL (ref 0.0–1.9)

## 2013-07-23 LAB — POC INFLUENZA A&B (BINAX/QUICKVUE)
Influenza A, POC: NEGATIVE
Influenza B, POC: NEGATIVE

## 2013-07-23 LAB — TSH: TSH: 1.86 u[IU]/mL (ref 0.35–5.50)

## 2013-07-23 MED ORDER — AMOXICILLIN-POT CLAVULANATE 875-125 MG PO TABS: 1.0000 | ORAL_TABLET | Freq: Two times a day (BID) | ORAL | Status: DC

## 2013-07-23 MED ORDER — CULTURELLE DIGESTIVE HEALTH PO CAPS: 1.0000 | ORAL_CAPSULE | Freq: Every day | ORAL | Status: DC

## 2013-07-23 MED ORDER — PREDNISONE (PAK) 10 MG PO TABS: ORAL_TABLET | ORAL | Status: DC

## 2013-07-23 NOTE — Patient Instructions (Addendum)
Your flu test was negative and you are out of the window for treatment to be worth the expense  You currently appear to have a viral  Syndrome .  The post nasal drip is causing your sore throat.  Lavage your sinuses twice daIly with Simply saline nasal spray.  Use benadryl 25 mg every 8 hours and Sudafed PE 10 to 30 every 8 hours to manage the drainage and congestion.  Gargle with salt water often for the sore throat.   If the throat is no better  In 3 to 4 days OR  if you develop T > 100.4,  Green nasal discharge,  Or facial pain,  START the amoxicillin/clav antibiotic  Please take a probiotic ( Align, Flora[place] or Culturelle) while you are on the antibiotic to prevent a serious antibiotic associated diarrhea  Called clostirudium dificile colitis and a vaginal yeast infection  .
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
## 2013-07-23 NOTE — Telephone Encounter (Signed)
Pt notified, will be seen today at 11:30

## 2013-07-23 NOTE — Telephone Encounter (Signed)
Pt called for appt for sinus infection.  No appt available.  Pt states she has tried Mucinex and Sudafed PE, EchoStareti Pot and takes an allergy medication everyday and none of this has helped.

## 2013-07-23 NOTE — Progress Notes (Signed)
Pre-visit discussion using our clinic review tool. No additional management support is needed unless otherwise documented below in the visit note.  

## 2013-07-24 ENCOUNTER — Encounter: Payer: Self-pay | Admitting: Internal Medicine

## 2013-07-25 ENCOUNTER — Encounter: Payer: Self-pay | Admitting: Internal Medicine

## 2013-07-25 DIAGNOSIS — J0100 Acute maxillary sinusitis, unspecified: Secondary | ICD-10-CM | POA: Insufficient documentation

## 2013-07-25 NOTE — Assessment & Plan Note (Signed)
Well-controlled on current medications.  hemoglobin A1c has been consistently less than 7.0 . sHe is up-to-date on eye exams and his foot exam is normal. SHe is on the appropriate medications. Lab Results  Component Value Date   HGBA1C 6.4 07/23/2013   Lab Results  Component Value Date   MICROALBUR 1.1 07/23/2013

## 2013-07-25 NOTE — Assessment & Plan Note (Addendum)
This URI is most likely viral  I have explained that in viral URIS, an antibiotic will not help the symptoms and will increase the risk of developing diarrhea.,  Continue oral and nasal decongestants,  Prednisone taper, and tylenol 650 mq 8 hrs for aches and pains,  And will add tessalon 100 mg every 8 hours prn cough  And add abx  only if symptoms worsen to include fevers, facial pain, purulent sputum./drainage. With signs on exam of early otitis as well on the left.  Strep and flu tests were negative.  abx given.

## 2013-07-25 NOTE — Assessment & Plan Note (Signed)
Smoking cessation again given.  She is currently smoke free due to chronic sinus congestion.

## 2013-07-26 ENCOUNTER — Encounter: Payer: Self-pay | Admitting: Internal Medicine

## 2013-07-27 ENCOUNTER — Telehealth: Payer: Self-pay | Admitting: Internal Medicine

## 2013-07-27 ENCOUNTER — Encounter: Payer: Self-pay | Admitting: Internal Medicine

## 2013-07-27 NOTE — Telephone Encounter (Signed)
Relevant patient education assigned to patient using Emmi. ° °

## 2013-09-18 ENCOUNTER — Encounter: Payer: Self-pay | Admitting: Adult Health

## 2013-09-18 ENCOUNTER — Ambulatory Visit (INDEPENDENT_AMBULATORY_CARE_PROVIDER_SITE_OTHER): Payer: BC Managed Care – PPO | Admitting: Adult Health

## 2013-09-18 VITALS — BP 114/72 | HR 87 | Temp 98.5°F | Resp 14 | Wt 228.5 lb

## 2013-09-18 DIAGNOSIS — J019 Acute sinusitis, unspecified: Secondary | ICD-10-CM

## 2013-09-18 MED ORDER — AZITHROMYCIN 250 MG PO TABS: ORAL_TABLET | ORAL | Status: DC
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
## 2013-09-18 NOTE — Progress Notes (Signed)
Pre visit review using our clinic review tool, if applicable. No additional management support is needed unless otherwise documented below in the visit note. 

## 2013-09-18 NOTE — Patient Instructions (Signed)
  Highly recommend that you stop smoking. This is a strong trigger for your symptoms.  Start Azithromycin 2 tabs today then 1 tab daily for the next 4 days.  You can continue Sudafed or mucinex if this helps.  Call if no improvement within 4-5 days.

## 2013-09-28 ENCOUNTER — Other Ambulatory Visit: Payer: Self-pay | Admitting: *Deleted

## 2013-09-28 MED ORDER — FUROSEMIDE 20 MG PO TABS: 20.0000 mg | ORAL_TABLET | Freq: Every day | ORAL | Status: DC

## 2013-10-01 ENCOUNTER — Other Ambulatory Visit: Payer: Self-pay | Admitting: Internal Medicine

## 2013-10-25 ENCOUNTER — Other Ambulatory Visit: Payer: Self-pay | Admitting: Internal Medicine

## 2013-10-29 ENCOUNTER — Other Ambulatory Visit: Payer: Self-pay | Admitting: Internal Medicine

## 2013-11-12 ENCOUNTER — Other Ambulatory Visit: Payer: Self-pay | Admitting: Internal Medicine

## 2013-12-22 ENCOUNTER — Other Ambulatory Visit: Payer: Self-pay | Admitting: Internal Medicine

## 2013-12-23 NOTE — Telephone Encounter (Signed)
Last refill 6.5.15.  Please advise refill

## 2013-12-28 NOTE — Telephone Encounter (Signed)
Ok to refill,  printed rx  

## 2013-12-29 NOTE — Telephone Encounter (Signed)
Rx faxed to pharmacy  

## 2014-01-04 ENCOUNTER — Other Ambulatory Visit: Payer: Self-pay | Admitting: Internal Medicine

## 2014-01-23 ENCOUNTER — Other Ambulatory Visit: Payer: Self-pay | Admitting: Internal Medicine

## 2014-02-01 ENCOUNTER — Telehealth: Payer: Self-pay | Admitting: Internal Medicine

## 2014-02-01 DIAGNOSIS — E119 Type 2 diabetes mellitus without complications: Secondary | ICD-10-CM

## 2014-02-01 NOTE — Telephone Encounter (Signed)
Pt request to have A1C check. No order in system. Please advise.msn

## 2014-02-01 NOTE — Telephone Encounter (Signed)
Last A1c in April ok to schedule.

## 2014-02-02 NOTE — Telephone Encounter (Signed)
Yes, needs fasting labs too,  All ordered

## 2014-02-02 NOTE — Telephone Encounter (Signed)
Sent mychart with message

## 2014-02-05 ENCOUNTER — Other Ambulatory Visit: Payer: Self-pay

## 2014-02-10 ENCOUNTER — Other Ambulatory Visit: Payer: Self-pay | Admitting: Internal Medicine

## 2014-02-15 ENCOUNTER — Other Ambulatory Visit (INDEPENDENT_AMBULATORY_CARE_PROVIDER_SITE_OTHER): Payer: BC Managed Care – PPO

## 2014-02-15 DIAGNOSIS — E119 Type 2 diabetes mellitus without complications: Secondary | ICD-10-CM

## 2014-02-15 LAB — COMPREHENSIVE METABOLIC PANEL
ALT: 29 U/L (ref 0–35)
AST: 27 U/L (ref 0–37)
Albumin: 3.6 g/dL (ref 3.5–5.2)
Alkaline Phosphatase: 147 U/L — ABNORMAL HIGH (ref 39–117)
BUN: 13 mg/dL (ref 6–23)
CHLORIDE: 103 meq/L (ref 96–112)
CO2: 19 meq/L (ref 19–32)
CREATININE: 1.2 mg/dL (ref 0.4–1.2)
Calcium: 9.5 mg/dL (ref 8.4–10.5)
GFR: 64.9 mL/min (ref >60.00)
GLUCOSE: 113 mg/dL — AB (ref 70–99)
Potassium: 4.9 mEq/L (ref 3.5–5.1)
Sodium: 136 mEq/L (ref 135–145)
TOTAL PROTEIN: 7.8 g/dL (ref 6.0–8.3)
Total Bilirubin: 0.5 mg/dL (ref 0.2–1.2)

## 2014-02-15 LAB — MICROALBUMIN / CREATININE URINE RATIO
CREATININE, U: 222.5 mg/dL
Microalb Creat Ratio: 0.2 mg/g (ref 0.0–30.0)
Microalb, Ur: 0.5 mg/dL (ref 0.0–1.9)

## 2014-02-15 LAB — LIPID PANEL
CHOL/HDL RATIO: 4
Cholesterol: 141 mg/dL (ref 0–200)
HDL: 36.8 mg/dL — AB (ref >39.00)
LDL Cholesterol: 88 mg/dL (ref 0–99)
NONHDL: 104.2
Triglycerides: 79 mg/dL (ref 0.0–149.0)
VLDL: 15.8 mg/dL (ref 0.0–40.0)

## 2014-02-15 LAB — HEMOGLOBIN A1C: HEMOGLOBIN A1C: 6.6 % — AB (ref 4.6–6.5)

## 2014-02-16 ENCOUNTER — Encounter: Payer: Self-pay | Admitting: Internal Medicine

## 2014-02-16 ENCOUNTER — Ambulatory Visit (INDEPENDENT_AMBULATORY_CARE_PROVIDER_SITE_OTHER): Payer: BC Managed Care – PPO | Admitting: Internal Medicine

## 2014-02-16 VITALS — BP 110/78 | HR 94 | Temp 98.4°F | Resp 16 | Ht 68.0 in | Wt 238.0 lb

## 2014-02-16 DIAGNOSIS — E669 Obesity, unspecified: Secondary | ICD-10-CM

## 2014-02-16 DIAGNOSIS — R748 Abnormal levels of other serum enzymes: Secondary | ICD-10-CM

## 2014-02-16 DIAGNOSIS — E119 Type 2 diabetes mellitus without complications: Secondary | ICD-10-CM

## 2014-02-16 DIAGNOSIS — I10 Essential (primary) hypertension: Secondary | ICD-10-CM

## 2014-02-16 DIAGNOSIS — E034 Atrophy of thyroid (acquired): Secondary | ICD-10-CM

## 2014-02-16 DIAGNOSIS — E1169 Type 2 diabetes mellitus with other specified complication: Secondary | ICD-10-CM

## 2014-02-16 DIAGNOSIS — E038 Other specified hypothyroidism: Secondary | ICD-10-CM

## 2014-02-16 DIAGNOSIS — E785 Hyperlipidemia, unspecified: Secondary | ICD-10-CM

## 2014-02-16 DIAGNOSIS — G4733 Obstructive sleep apnea (adult) (pediatric): Secondary | ICD-10-CM

## 2014-02-16 DIAGNOSIS — Z23 Encounter for immunization: Secondary | ICD-10-CM

## 2014-02-16 MED ORDER — SULFAMETHOXAZOLE-TRIMETHOPRIM 800-160 MG PO TABS: 1.0000 | ORAL_TABLET | Freq: Two times a day (BID) | ORAL | Status: DC
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
## 2014-02-16 NOTE — Patient Instructions (Signed)
Your diabetes is well controlled,  And your cholesterol is improving as well.   Get back on the diet ,  Iw ant 16 lbs off by the next visit !!   Antibiotics (Septra DS)  And opical 1% hydrocortisone for the ear itching .   If the earlobe doesn't resolve in 3 weeks, let me know:  Dermatology referral  You need another sleep Study to get your CPAP supplies .  I will get it scheduled

## 2014-02-17 ENCOUNTER — Encounter: Payer: Self-pay | Admitting: Internal Medicine

## 2014-02-17 DIAGNOSIS — R748 Abnormal levels of other serum enzymes: Secondary | ICD-10-CM | POA: Insufficient documentation

## 2014-02-17 NOTE — Assessment & Plan Note (Signed)
diagnosed with prior sleep study but treatment has been deferred d by patient.  Discussed the long term history of OSA , the risks of long term damage to heart and the signs and symptoms attributable to OSA.  Advised patient to consider  significant weight loss and/or use of CPAP. Repeat sleep study ordered

## 2014-02-17 NOTE — Assessment & Plan Note (Signed)
LDL and triglycerides are at goal on current medications. He has no side effects and liver enzymes are normal. No changes today. Lab Results  Component Value Date   CHOL 141 02/15/2014   HDL 36.80* 02/15/2014   LDLCALC 88 02/15/2014   LDLDIRECT 74.8 03/27/2011   TRIG 79.0 02/15/2014   CHOLHDL 4 02/15/2014    Lab Results  Component Value Date   ALT 29 02/15/2014   AST 27 02/15/2014   ALKPHOS 147* 02/15/2014   BILITOT 0.5 02/15/2014

## 2014-02-17 NOTE — Assessment & Plan Note (Signed)
Thyroid function is WNL on current dose.  No current changes needed.  

## 2014-02-17 NOTE — Assessment & Plan Note (Signed)
Well controlled on current regimen. Renal function stable, no changes today.  Lab Results  Component Value Date   CREATININE 1.2 02/15/2014   Lab Results  Component Value Date   NA 136 02/15/2014   K 4.9 02/15/2014   CL 103 02/15/2014   CO2 19 02/15/2014

## 2014-02-17 NOTE — Assessment & Plan Note (Signed)
Well-controlled on current medications.  hemoglobin A1c has been consistently less than 7.0 . sHe is up-to-date on eye exams and his foot exam is normal. SHe is on the appropriate medications. Lab Results  Component Value Date   HGBA1C 6.6* 02/15/2014   Lab Results  Component Value Date   MICROALBUR 0.5 02/15/2014

## 2014-02-17 NOTE — Assessment & Plan Note (Signed)
She is s.p cholecystectomy.  Etiology may be fatty liver.  Ultrasound ordered.

## 2014-03-29 ENCOUNTER — Other Ambulatory Visit: Payer: Self-pay | Admitting: *Deleted

## 2014-03-29 MED ORDER — ALBUTEROL SULFATE HFA 108 (90 BASE) MCG/ACT IN AERS: 2.0000 | INHALATION_SPRAY | Freq: Four times a day (QID) | RESPIRATORY_TRACT | Status: DC | PRN

## 2014-04-08 ENCOUNTER — Other Ambulatory Visit: Payer: Self-pay | Admitting: Internal Medicine

## 2014-04-20 ENCOUNTER — Encounter: Payer: Self-pay | Admitting: Internal Medicine

## 2014-04-20 ENCOUNTER — Other Ambulatory Visit: Payer: Self-pay | Admitting: Internal Medicine

## 2014-04-20 NOTE — Telephone Encounter (Signed)
Ok to fill 

## 2014-04-20 NOTE — Telephone Encounter (Signed)
Ok to refill,  printed rx  

## 2014-04-22 ENCOUNTER — Other Ambulatory Visit: Payer: Self-pay | Admitting: Internal Medicine

## 2014-04-26 MED ORDER — CYANOCOBALAMIN 1000 MCG/ML IJ SOLN: 1000.0000 ug | INTRAMUSCULAR | Status: DC

## 2014-04-26 MED ORDER — INSULIN GLARGINE 100 UNIT/ML ~~LOC~~ SOLN: 2.0000 [IU] | Freq: Every day | SUBCUTANEOUS | Status: DC

## 2014-04-26 MED ORDER — "SYRINGE 25G X 1"" 3 ML MISC": Status: DC

## 2014-05-05 ENCOUNTER — Other Ambulatory Visit: Payer: Self-pay | Admitting: Internal Medicine

## 2014-05-06 ENCOUNTER — Other Ambulatory Visit: Payer: Self-pay | Admitting: Internal Medicine

## 2014-05-06 ENCOUNTER — Encounter: Payer: Self-pay | Admitting: Internal Medicine

## 2014-05-07 MED ORDER — INSULIN GLARGINE 100 UNIT/ML SOLOSTAR PEN: PEN_INJECTOR | SUBCUTANEOUS | Status: DC

## 2014-05-07 NOTE — Telephone Encounter (Signed)
Refilled in another encounter, see refill note.

## 2014-05-14 ENCOUNTER — Other Ambulatory Visit: Payer: Self-pay | Admitting: Internal Medicine

## 2014-06-04 ENCOUNTER — Other Ambulatory Visit: Payer: Self-pay | Admitting: Internal Medicine

## 2014-06-04 NOTE — Telephone Encounter (Signed)
Last refill 12.29.15, last OV 10.27.15.  Please advise refill

## 2014-06-04 NOTE — Telephone Encounter (Signed)
Faxed to pharmacy

## 2014-06-04 NOTE — Telephone Encounter (Signed)
Ok to refill,  printed rx  

## 2014-08-23 ENCOUNTER — Ambulatory Visit (INDEPENDENT_AMBULATORY_CARE_PROVIDER_SITE_OTHER): Payer: BC Managed Care – PPO | Admitting: Internal Medicine

## 2014-08-23 ENCOUNTER — Ambulatory Visit: Payer: Self-pay | Admitting: Internal Medicine

## 2014-08-23 DIAGNOSIS — E669 Obesity, unspecified: Secondary | ICD-10-CM

## 2014-08-23 DIAGNOSIS — E119 Type 2 diabetes mellitus without complications: Secondary | ICD-10-CM

## 2014-08-24 NOTE — Assessment & Plan Note (Signed)
Patient cancelled appt < 24 hours to appt via MyChart

## 2014-08-24 NOTE — Progress Notes (Signed)
Patient ID: Madeline Strickland, female   DOB: 05/05/1970, 44 y.o.   MRN: [id number]   pateint cancelled via Mychart < 24 hours on the mornign of her appointment

## 2014-09-05 ENCOUNTER — Other Ambulatory Visit: Payer: Self-pay | Admitting: Internal Medicine

## 2014-09-05 DIAGNOSIS — E119 Type 2 diabetes mellitus without complications: Secondary | ICD-10-CM

## 2014-09-05 DIAGNOSIS — E034 Atrophy of thyroid (acquired): Secondary | ICD-10-CM

## 2014-09-06 NOTE — Telephone Encounter (Signed)
Refill one 30 days only.  Has not had thyroid or A1c checked in over 6 months   so she needs fasting labs prior to next refill  LABS ORDERED

## 2014-09-06 NOTE — Telephone Encounter (Signed)
Last TSH 4.2.15.  Please advise refill

## 2014-09-11 ENCOUNTER — Other Ambulatory Visit: Payer: Self-pay | Admitting: Internal Medicine

## 2014-09-11 NOTE — Telephone Encounter (Signed)
Okay to refill? Last seen on 02/16/14 & no future appt scheduled. (no showed last scheduled appt per epic)

## 2014-09-14 ENCOUNTER — Encounter: Payer: Self-pay | Admitting: *Deleted

## 2014-09-14 NOTE — Telephone Encounter (Signed)
Sent pt a [facility]ologistmychart message & Rx faxed by Olegario MessierKathy

## 2014-09-14 NOTE — Telephone Encounter (Signed)
Refilling for once dAILY TO AVOID WITHDRAWAL FROM ABRUPT DISCONTINUATION,  NO MORE REFILLS WITHOUT VISIT

## 2014-10-05 ENCOUNTER — Other Ambulatory Visit: Payer: Self-pay | Admitting: Internal Medicine

## 2014-10-18 ENCOUNTER — Other Ambulatory Visit: Payer: Self-pay

## 2014-10-22 ENCOUNTER — Ambulatory Visit (INDEPENDENT_AMBULATORY_CARE_PROVIDER_SITE_OTHER): Payer: BC Managed Care – PPO | Admitting: Internal Medicine

## 2014-10-22 ENCOUNTER — Other Ambulatory Visit: Payer: Self-pay | Admitting: Internal Medicine

## 2014-10-22 ENCOUNTER — Encounter: Payer: Self-pay | Admitting: Internal Medicine

## 2014-10-22 VITALS — BP 126/78 | HR 100 | Temp 98.7°F | Resp 14 | Ht 68.0 in | Wt 241.2 lb

## 2014-10-22 DIAGNOSIS — E119 Type 2 diabetes mellitus without complications: Secondary | ICD-10-CM

## 2014-10-22 DIAGNOSIS — E669 Obesity, unspecified: Secondary | ICD-10-CM

## 2014-10-22 DIAGNOSIS — I10 Essential (primary) hypertension: Secondary | ICD-10-CM | POA: Diagnosis not present

## 2014-10-22 DIAGNOSIS — E785 Hyperlipidemia, unspecified: Secondary | ICD-10-CM

## 2014-10-22 DIAGNOSIS — E1169 Type 2 diabetes mellitus with other specified complication: Secondary | ICD-10-CM

## 2014-10-22 DIAGNOSIS — E038 Other specified hypothyroidism: Secondary | ICD-10-CM

## 2014-10-22 DIAGNOSIS — B372 Candidiasis of skin and nail: Secondary | ICD-10-CM

## 2014-10-22 DIAGNOSIS — H60541 Acute eczematoid otitis externa, right ear: Secondary | ICD-10-CM

## 2014-10-22 DIAGNOSIS — E034 Atrophy of thyroid (acquired): Secondary | ICD-10-CM

## 2014-10-22 MED ORDER — MUPIROCIN 2 % EX OINT: 1.0000 "application " | TOPICAL_OINTMENT | Freq: Two times a day (BID) | CUTANEOUS | Status: DC

## 2014-10-22 MED ORDER — NYSTATIN 100000 UNIT/GM EX POWD: Freq: Two times a day (BID) | CUTANEOUS | Status: DC

## 2014-10-22 MED ORDER — MOMETASONE FUROATE 0.1 % EX CREA: 1.0000 "application " | TOPICAL_CREAM | Freq: Every day | CUTANEOUS | Status: DC

## 2014-10-22 NOTE — Patient Instructions (Signed)
Nystatin powder for the rash on your neck crease  Mometasone ointment for the itchy ears.  Apply with q tip   Mupirocin ointment for the rough place on the outside of your left ear

## 2014-10-22 NOTE — Progress Notes (Signed)
Subjective:  Patient ID: Madeline Strickland, female    DOB: [birth year]  Age: 44 y.o. MRN: [id number]  CC: The primary encounter diagnosis was Diabetes mellitus type 2 in obese. Diagnoses of Diabetes mellitus without complication, Hypothyroidism due to acquired atrophy of thyroid, Essential hypertension, Hyperlipidemia associated with type 2 diabetes mellitus, Candidiasis of skin, and Otitis externa, eczematoid, right were also pertinent to this visit.  HPI [name] presents for follow up on type 2 DM and other issues.   Husband recently hospitalized for DVT leading to kidney failure   After being admitted and sent for home for chest pain from [facility].  Patient has been stressed out since husband was so ill and receiving dialysis and rehab,  Came home one week ago.  Dialysis cathter removed,    Patient not sleeping well,  Memory is poor,  Using alprazolam 1/2 tablet .   Sugars running in the 150 to 170 range. .    Ears have been itching, and she is using keys, bobby pins and q tips to relieve the itching.    Itchy rash in crease of neck   Left ear helix has a persistent hyperpigmented rash   Outpatient Prescriptions Prior to Visit  Medication Sig Dispense Refill  . albuterol (PROVENTIL HFA;VENTOLIN HFA) 108 (90 BASE) MCG/ACT inhaler Inhale 2 puffs into the lungs every 6 (six) hours as needed for wheezing or shortness of breath. 1 Inhaler 3  . ALPRAZolam (XANAX) 0.5 MG tablet Take 0.5 tablets (0.25 mg total) by mouth 2 (two) times daily. 30 tablet 0  . Alum & Mag Hydroxide-Simeth (MAALOX ADVANCED PO) Take by mouth at bedtime.      Marland Kitchen b complex vitamins tablet Take 1 tablet by mouth daily.      . Blood Glucose Monitoring Suppl (ONE TOUCH ULTRA SYSTEM KIT) W/DEVICE KIT 1 kit by Does not apply route once. 1 each 0  . cyanocobalamin (,VITAMIN B-12,) 1000 MCG/ML injection Inject 1 mL (1,000 mcg total) into the muscle every 30 (thirty) days. 10 mL 1  . ferrous fumarate (HEMOCYTE - 106 MG FE) 325 (106  FE) MG TABS Take 1 tablet by mouth.      . Fluticasone-Salmeterol (ADVAIR DISKUS) 250-50 MCG/DOSE AEPB Inhale 1 puff into the lungs 2 (two) times daily. 60 each 6  . furosemide (LASIX) 20 MG tablet Take 1 tablet (20 mg total) by mouth daily. As needed for fluid retention 30 tablet 3  . glucose blood (FREESTYLE TEST STRIPS) test strip Use as instructed 100 each 5  . Insulin Glargine (LANTUS SOLOSTAR) 100 UNIT/ML Solostar Pen Inject 0.02 mLs (2 Units total) into the skin at bedtime. 5 pen 1  . Insulin Pen Needle (LITE TOUCH PEN NEEDLES) 31G X 5 MM MISC 1 application by Does not apply route 3 (three) times daily. 100 each 1  . Lactobacillus-Inulin ([place]) CAPS Take 1 capsule by mouth daily. 14 capsule 0  . Lancets (ONETOUCH ULTRASOFT) lancets Use as instructed 100 each 12  . levothyroxine (SYNTHROID, LEVOTHROID) 175 MCG tablet TAKE 1 TABLET BY MOUTH EVERY DAY 30 tablet 0  . metoprolol succinate (TOPROL-XL) 50 MG 24 hr tablet TAKE 1 TABLET BY MOUTH EVERY DAY 30 tablet 5  . metoprolol succinate (TOPROL-XL) 50 MG 24 hr tablet TAKE 1 TABLET BY MOUTH EVERY DAY 30 tablet 3  . metoprolol succinate (TOPROL-XL) 50 MG 24 hr tablet TAKE 1 TABLET BY MOUTH EVERY DAY 30 tablet 0  . montelukast (SINGULAIR) 10 MG  tablet TAKE 1 TABLET BY MOUTH AT BEDTIME 90 tablet 1  . NEXIUM 40 MG capsule TAKE ONE CAPSULE BY MOUTH EVERY DAY 30 capsule 0  . phentermine (ADIPEX-P) 37.5 MG tablet 1/2 tablet twice daily before meals 30 tablet 2  . spironolactone (ALDACTONE) 100 MG tablet TAKE 1 TABLET BY MOUTH TWICE DAILY 180 tablet 1  . Syringe/Needle, Disp, (SYRINGE 3CC/25GX1") 25G X 1" 3 ML MISC Use as directed 15 each 0  . albuterol (ACCUNEB) 1.25 MG/3ML nebulizer solution Take 3 mLs (1.25 mg total) by nebulization every 6 (six) hours as needed for wheezing. 75 mL 12  . acyclovir (ZOVIRAX) 400 MG tablet Take 1 tablet (400 mg total) by mouth every 4 (four) hours while awake. (Patient not taking: Reported on  10/22/2014) 28 tablet 1  . atorvastatin (LIPITOR) 40 MG tablet TAKE 1 TABLET BY MOUTH EVERY DAY (Patient not taking: Reported on 10/22/2014) 90 tablet 1  . azithromycin (ZITHROMAX) 250 MG tablet Start 2 tablets today then 1 tablet daily for 4 days. (Patient not taking: Reported on 10/22/2014) 6 tablet 0  . chlorhexidine (PERIDEX) 0.12 % solution Use as directed 15 mLs in the mouth or throat 2 (two) times daily. (Patient not taking: Reported on 10/22/2014) 120 mL 0  . fluconazole (DIFLUCAN) 150 MG tablet Take 1 tablet (150 mg total) by mouth daily. (Patient not taking: Reported on 10/22/2014) 2 tablet 0  . sulfamethoxazole-trimethoprim (SEPTRA DS) 800-160 MG per tablet Take 1 tablet by mouth 2 (two) times daily. (Patient not taking: Reported on 10/22/2014) 14 tablet 0   No facility-administered medications prior to visit.    Review of Systems;  Patient denies headache, fevers, malaise, unintentional weight loss, skin rash, eye pain, sinus congestion and sinus pain, sore throat, dysphagia,  hemoptysis , cough, dyspnea, wheezing, chest pain, palpitations, orthopnea, edema, abdominal pain, nausea, melena, diarrhea, constipation, flank pain, dysuria, hematuria, urinary  Frequency, nocturia, numbness, tingling, seizures,  Focal weakness, Loss of consciousness,  Tremor, insomnia, depression, anxiety, and suicidal ideation.      Objective:  BP 126/78 mmHg  Pulse 100  Temp(Src) 98.7 F (37.1 C) (Oral)  Resp 14  Ht 5' 8"  (1.727 m)  Wt 241 lb 4 oz (109.43 kg)  BMI 36.69 kg/m2  SpO2 100%  BP Readings from Last 3 Encounters:  10/22/14 126/78  02/16/14 110/78  09/18/13 114/72    Wt Readings from Last 3 Encounters:  10/22/14 241 lb 4 oz (109.43 kg)  02/16/14 238 lb (107.956 kg)  09/18/13 228 lb 8 oz (103.647 kg)    General appearance: alert, cooperative and appears stated age Ears: normal TM's and external ear canals both ears Throat: lips, mucosa, and tongue normal; teeth and gums normal Neck: no  adenopathy, no carotid bruit, supple, symmetrical, trachea midline and thyroid not enlarged, symmetric, no tenderness/mass/nodules Back: symmetric, no curvature. ROM normal. No CVA tenderness. Lungs: clear to auscultation bilaterally Heart: regular rate and rhythm, S1, S2 normal, no murmur, click, rub or gallop Abdomen: soft, non-tender; bowel sounds normal; no masses,  no organomegaly Pulses: 2+ and symmetric Skin: Skin color, texture, turgor normal. No rashes or lesions Lymph nodes: Cervical, supraclavicular, and axillary nodes normal.  Lab Results  Component Value Date   HGBA1C 6.4* 10/22/2014   HGBA1C 6.6* 02/15/2014   HGBA1C 6.4 07/23/2013    Lab Results  Component Value Date   CREATININE 1.01 10/22/2014   CREATININE 1.2 02/15/2014   CREATININE 1.0 07/23/2013    Lab Results  Component Value Date  WBC 14.6* 07/23/2013   HGB 12.7 07/23/2013   HCT 38.7 07/23/2013   PLT 329.0 07/23/2013   GLUCOSE 109* 10/22/2014   CHOL 171 10/22/2014   TRIG 115 10/22/2014   HDL 32* 10/22/2014   LDLDIRECT 128* 10/22/2014   LDLCALC 116* 10/22/2014   ALT 25 10/22/2014   AST 18 10/22/2014   NA 139 10/22/2014   K 4.2 10/22/2014   CL 106 10/22/2014   CREATININE 1.01 10/22/2014   BUN 10 10/22/2014   CO2 26 10/22/2014   TSH 1.86 07/23/2013   HGBA1C 6.4* 10/22/2014   MICROALBUR 1.5 10/22/2014    No results found.  Assessment & Plan:   Problem List Items Addressed This Visit      Unprioritized   Diabetes mellitus type 2 in obese - Primary    Well-controlled on current medications.  hemoglobin A1c has been consistently less than 7.0 . sHe is up-to-date on eye exams and her foot exam is normal. SHe is on the appropriate medications. Lab Results  Component Value Date   HGBA1C 6.4* 10/22/2014   Lab Results  Component Value Date   MICROALBUR 1.5 10/22/2014               Relevant Orders   TSH   Hyperlipidemia associated with type 2 diabetes mellitus    LDL and  triglycerides are at goal on current medications. He has no side effects and liver enzymes are normal. No changes today. Lab Results  Component Value Date   CHOL 171 10/22/2014   HDL 32* 10/22/2014   LDLCALC 116* 10/22/2014   LDLDIRECT 128* 10/22/2014   TRIG 115 10/22/2014   CHOLHDL 5.3 10/22/2014    Lab Results  Component Value Date   ALT 25 10/22/2014   AST 18 10/22/2014   ALKPHOS 119* 10/22/2014   BILITOT 0.3 10/22/2014           Candidiasis of skin    Nystatin powder for rash in crease /neck folds,       Relevant Medications   nystatin (MYCOSTATIN) powder   mupirocin ointment (BACTROBAN) 2 %   Otitis externa, eczematoid    mometasone ointment twice daily       Hypertension   Hypothyroidism due to acquired atrophy of thyroid    Other Visit Diagnoses    Diabetes mellitus without complication        Relevant Orders    Comprehensive metabolic panel (Completed)    Hemoglobin A1c (Completed)    LDL cholesterol, direct (Completed)    Lipid panel (Completed)    Microalbumin / creatinine urine ratio (Completed)       I have discontinued Ms. Dye fluconazole, acyclovir, chlorhexidine, azithromycin, sulfamethoxazole-trimethoprim, and atorvastatin. I am also having her start on nystatin, mupirocin ointment, and mometasone. Additionally, I am having her maintain her Alum & Mag Hydroxide-Simeth (MAALOX ADVANCED PO), b complex vitamins, ferrous fumarate, albuterol, metoprolol succinate, metoprolol succinate, phentermine, Fluticasone-Salmeterol, glucose blood, ONE TOUCH ULTRA SYSTEM KIT, onetouch ultrasoft, Insulin Pen Needle, [employer], furosemide, NEXIUM, albuterol, cyanocobalamin, SYRINGE 3CC/25GX1", Insulin Glargine, spironolactone, ALPRAZolam, montelukast, levothyroxine, and metoprolol succinate.  Meds ordered this encounter  Medications  . nystatin (MYCOSTATIN) powder    Sig: Apply topically 2 (two) times daily.    Dispense:  15 g    Refill:  3  .  mupirocin ointment (BACTROBAN) 2 %    Sig: Apply 1 application topically 2 (two) times daily. On affected ear    Dispense:  22 g    Refill:  0  . mometasone (ELOCON) 0.1 % cream    Sig: Apply 1 application topically daily. In ears as needed    Dispense:  45 g    Refill:  0    Medications Discontinued During This Encounter  Medication Reason  . azithromycin (ZITHROMAX) 250 MG tablet Completed Course  . sulfamethoxazole-trimethoprim (SEPTRA DS) 800-160 MG per tablet Completed Course  . fluconazole (DIFLUCAN) 150 MG tablet   . chlorhexidine (PERIDEX) 0.12 % solution   . azithromycin (ZITHROMAX) 250 MG tablet Completed Course  . atorvastatin (LIPITOR) 40 MG tablet   . acyclovir (ZOVIRAX) 400 MG tablet     Follow-up: Return in about 3 months (around 01/22/2015) for follow up diabetes.   Crecencio Mc, MD

## 2014-10-23 LAB — COMPREHENSIVE METABOLIC PANEL
ALK PHOS: 119 U/L — AB (ref 39–117)
ALT: 25 U/L (ref 0–35)
AST: 18 U/L (ref 0–37)
Albumin: 3.8 g/dL (ref 3.5–5.2)
BILIRUBIN TOTAL: 0.3 mg/dL (ref 0.2–1.2)
BUN: 10 mg/dL (ref 6–23)
CO2: 26 mEq/L (ref 19–32)
Calcium: 8.9 mg/dL (ref 8.4–10.5)
Chloride: 106 mEq/L (ref 96–112)
Creat: 1.01 mg/dL (ref 0.50–1.10)
GLUCOSE: 109 mg/dL — AB (ref 70–99)
POTASSIUM: 4.2 meq/L (ref 3.5–5.3)
Sodium: 139 mEq/L (ref 135–145)
Total Protein: 6.9 g/dL (ref 6.0–8.3)

## 2014-10-23 LAB — MICROALBUMIN / CREATININE URINE RATIO
Creatinine, Urine: 167 mg/dL
MICROALB/CREAT RATIO: 9 mg/g (ref 0.0–30.0)
Microalb, Ur: 1.5 mg/dL (ref <2.0)

## 2014-10-23 LAB — LDL CHOLESTEROL, DIRECT: Direct LDL: 128 mg/dL — ABNORMAL HIGH

## 2014-10-23 LAB — LIPID PANEL
CHOLESTEROL: 171 mg/dL (ref 0–200)
HDL: 32 mg/dL — ABNORMAL LOW (ref >=46)
LDL Cholesterol: 116 mg/dL — ABNORMAL HIGH (ref 0–99)
Total CHOL/HDL Ratio: 5.3 Ratio
Triglycerides: 115 mg/dL (ref <150)
VLDL: 23 mg/dL (ref 0–40)

## 2014-10-23 LAB — HEMOGLOBIN A1C
HEMOGLOBIN A1C: 6.4 % — AB (ref <5.7)
Mean Plasma Glucose: 137 mg/dL — ABNORMAL HIGH (ref <117)

## 2014-10-24 DIAGNOSIS — B372 Candidiasis of skin and nail: Secondary | ICD-10-CM | POA: Insufficient documentation

## 2014-10-24 DIAGNOSIS — H60549 Acute eczematoid otitis externa, unspecified ear: Secondary | ICD-10-CM | POA: Insufficient documentation

## 2014-10-24 NOTE — Assessment & Plan Note (Signed)
LDL and triglycerides are at goal on current medications. He has no side effects and liver enzymes are normal. No changes today. Lab Results  Component Value Date   CHOL 171 10/22/2014   HDL 32* 10/22/2014   LDLCALC 116* 10/22/2014   LDLDIRECT 128* 10/22/2014   TRIG 115 10/22/2014   CHOLHDL 5.3 10/22/2014    Lab Results  Component Value Date   ALT 25 10/22/2014   AST 18 10/22/2014   ALKPHOS 119* 10/22/2014   BILITOT 0.3 10/22/2014

## 2014-10-24 NOTE — Assessment & Plan Note (Signed)
mometasone ointment twice daily

## 2014-10-24 NOTE — Assessment & Plan Note (Signed)
Nystatin powder for rash in crease /neck folds,

## 2014-10-24 NOTE — Assessment & Plan Note (Signed)
Well-controlled on current medications.  hemoglobin A1c has been consistently less than 7.0 . sHe is up-to-date on eye exams and her foot exam is normal. SHe is on the appropriate medications. Lab Results  Component Value Date   HGBA1C 6.4* 10/22/2014   Lab Results  Component Value Date   MICROALBUR 1.5 10/22/2014

## 2014-10-25 ENCOUNTER — Encounter: Payer: Self-pay | Admitting: Internal Medicine

## 2014-10-27 ENCOUNTER — Encounter: Payer: Self-pay | Admitting: *Deleted

## 2014-10-27 LAB — TSH: TSH: 1.03 u[IU]/mL (ref 0.350–4.500)

## 2014-10-31 ENCOUNTER — Encounter: Payer: Self-pay | Admitting: Internal Medicine

## 2014-10-31 ENCOUNTER — Other Ambulatory Visit: Payer: Self-pay | Admitting: Internal Medicine

## 2014-11-01 MED ORDER — FAMOTIDINE 20 MG PO TABS: 20.0000 mg | ORAL_TABLET | Freq: Two times a day (BID) | ORAL | Status: DC

## 2014-11-01 NOTE — Telephone Encounter (Signed)
My chart:  Yes,  But given the current controversy regarding prolonged use of PPI in patients without documented Barretts esophagus.  I suggest a trial of famotidine 20 mg twice daily.  If  Your reflux symptoms return,  Let me know and I will call in omeprazole.   Rx sent   .Regards,  Dr. Darrick Huntsmanullo

## 2014-11-02 ENCOUNTER — Other Ambulatory Visit: Payer: Self-pay | Admitting: Internal Medicine

## 2014-11-02 NOTE — Telephone Encounter (Signed)
Last OV 7.1.16.  Please advise refill 

## 2014-11-03 NOTE — Telephone Encounter (Signed)
Ok to refill,  printed rx  

## 2014-11-05 NOTE — Telephone Encounter (Signed)
I have refilled the rx for  alprazolam

## 2014-11-05 NOTE — Telephone Encounter (Signed)
Patient has received script and picked up medication on 11/03/14

## 2014-11-24 ENCOUNTER — Other Ambulatory Visit: Payer: Self-pay | Admitting: Internal Medicine

## 2014-12-11 ENCOUNTER — Other Ambulatory Visit: Payer: Self-pay | Admitting: Internal Medicine

## 2015-01-06 ENCOUNTER — Other Ambulatory Visit: Payer: Self-pay | Admitting: Internal Medicine

## 2015-01-14 ENCOUNTER — Other Ambulatory Visit: Payer: Self-pay | Admitting: Internal Medicine

## 2015-02-07 ENCOUNTER — Other Ambulatory Visit: Payer: Self-pay | Admitting: Internal Medicine

## 2015-02-23 ENCOUNTER — Other Ambulatory Visit: Payer: Self-pay | Admitting: Internal Medicine

## 2015-02-23 ENCOUNTER — Ambulatory Visit: Payer: Self-pay | Admitting: Internal Medicine

## 2015-03-01 ENCOUNTER — Encounter: Payer: Self-pay | Admitting: Internal Medicine

## 2015-03-01 ENCOUNTER — Ambulatory Visit (INDEPENDENT_AMBULATORY_CARE_PROVIDER_SITE_OTHER): Payer: BC Managed Care – PPO | Admitting: Internal Medicine

## 2015-03-01 VITALS — BP 118/74 | HR 64 | Temp 98.6°F | Resp 15 | Ht 68.0 in

## 2015-03-01 DIAGNOSIS — E669 Obesity, unspecified: Secondary | ICD-10-CM | POA: Diagnosis not present

## 2015-03-01 DIAGNOSIS — E039 Hypothyroidism, unspecified: Secondary | ICD-10-CM

## 2015-03-01 DIAGNOSIS — R5383 Other fatigue: Secondary | ICD-10-CM

## 2015-03-01 DIAGNOSIS — E119 Type 2 diabetes mellitus without complications: Secondary | ICD-10-CM | POA: Diagnosis not present

## 2015-03-01 DIAGNOSIS — E559 Vitamin D deficiency, unspecified: Secondary | ICD-10-CM | POA: Diagnosis not present

## 2015-03-01 DIAGNOSIS — E662 Morbid (severe) obesity with alveolar hypoventilation: Secondary | ICD-10-CM | POA: Diagnosis not present

## 2015-03-01 DIAGNOSIS — L680 Hirsutism: Secondary | ICD-10-CM

## 2015-03-01 DIAGNOSIS — Z23 Encounter for immunization: Secondary | ICD-10-CM | POA: Diagnosis not present

## 2015-03-01 DIAGNOSIS — E038 Other specified hypothyroidism: Secondary | ICD-10-CM

## 2015-03-01 DIAGNOSIS — D510 Vitamin B12 deficiency anemia due to intrinsic factor deficiency: Secondary | ICD-10-CM

## 2015-03-01 DIAGNOSIS — M130 Polyarthritis, unspecified: Secondary | ICD-10-CM

## 2015-03-01 DIAGNOSIS — E538 Deficiency of other specified B group vitamins: Secondary | ICD-10-CM

## 2015-03-01 DIAGNOSIS — G4710 Hypersomnia, unspecified: Secondary | ICD-10-CM | POA: Diagnosis not present

## 2015-03-01 DIAGNOSIS — E034 Atrophy of thyroid (acquired): Secondary | ICD-10-CM

## 2015-03-01 DIAGNOSIS — R748 Abnormal levels of other serum enzymes: Secondary | ICD-10-CM

## 2015-03-01 DIAGNOSIS — Z1239 Encounter for other screening for malignant neoplasm of breast: Secondary | ICD-10-CM

## 2015-03-01 DIAGNOSIS — Z113 Encounter for screening for infections with a predominantly sexual mode of transmission: Secondary | ICD-10-CM

## 2015-03-01 DIAGNOSIS — E1169 Type 2 diabetes mellitus with other specified complication: Secondary | ICD-10-CM

## 2015-03-01 DIAGNOSIS — G479 Sleep disorder, unspecified: Secondary | ICD-10-CM

## 2015-03-01 DIAGNOSIS — E785 Hyperlipidemia, unspecified: Secondary | ICD-10-CM

## 2015-03-01 LAB — COMPREHENSIVE METABOLIC PANEL
ALBUMIN: 4.1 g/dL (ref 3.5–5.2)
ALK PHOS: 124 U/L — AB (ref 39–117)
ALT: 36 U/L — AB (ref 0–35)
AST: 27 U/L (ref 0–37)
BILIRUBIN TOTAL: 0.3 mg/dL (ref 0.2–1.2)
BUN: 13 mg/dL (ref 6–23)
CO2: 28 mEq/L (ref 19–32)
Calcium: 9.3 mg/dL (ref 8.4–10.5)
Chloride: 104 mEq/L (ref 96–112)
Creatinine, Ser: 1.03 mg/dL (ref 0.40–1.20)
GFR: 74.1 mL/min (ref >60.00)
GLUCOSE: 128 mg/dL — AB (ref 70–99)
POTASSIUM: 4.5 meq/L (ref 3.5–5.1)
Sodium: 138 mEq/L (ref 135–145)
TOTAL PROTEIN: 6.9 g/dL (ref 6.0–8.3)

## 2015-03-01 LAB — CBC WITH DIFFERENTIAL/PLATELET
BASOS PCT: 0.3 % (ref 0.0–3.0)
Basophils Absolute: 0 10*3/uL (ref 0.0–0.1)
Eosinophils Absolute: 0.3 10*3/uL (ref 0.0–0.7)
Eosinophils Relative: 2 % (ref 0.0–5.0)
HCT: 38.5 % (ref 36.0–46.0)
HEMOGLOBIN: 12.3 g/dL (ref 12.0–15.0)
LYMPHS ABS: 2.7 10*3/uL (ref 0.7–4.0)
LYMPHS PCT: 17.1 % (ref 12.0–46.0)
MCHC: 31.9 g/dL (ref 30.0–36.0)
MCV: 89 fl (ref 78.0–100.0)
Monocytes Absolute: 1 10*3/uL (ref 0.1–1.0)
Monocytes Relative: 6.3 % (ref 3.0–12.0)
Neutro Abs: 11.9 10*3/uL — ABNORMAL HIGH (ref 1.4–7.7)
Neutrophils Relative %: 74.3 % (ref 43.0–77.0)
Platelets: 324 10*3/uL (ref 150.0–400.0)
RBC: 4.32 Mil/uL (ref 3.87–5.11)
RDW: 14.8 % (ref 11.5–15.5)
WBC: 16 10*3/uL — ABNORMAL HIGH (ref 4.0–10.5)

## 2015-03-01 LAB — SEDIMENTATION RATE: Sed Rate: 34 mm/hr — ABNORMAL HIGH (ref 0–22)

## 2015-03-01 LAB — LIPID PANEL
CHOL/HDL RATIO: 3
CHOLESTEROL: 112 mg/dL (ref 0–200)
HDL: 33.2 mg/dL — ABNORMAL LOW (ref >39.00)
LDL CALC: 59 mg/dL (ref 0–99)
NonHDL: 78.94
Triglycerides: 98 mg/dL (ref 0.0–149.0)
VLDL: 19.6 mg/dL (ref 0.0–40.0)

## 2015-03-01 LAB — MICROALBUMIN / CREATININE URINE RATIO
Creatinine,U: 202.1 mg/dL
MICROALB UR: 0.9 mg/dL (ref 0.0–1.9)
Microalb Creat Ratio: 0.4 mg/g (ref 0.0–30.0)

## 2015-03-01 LAB — C-REACTIVE PROTEIN: CRP: 0.4 mg/dL — ABNORMAL LOW (ref 0.5–20.0)

## 2015-03-01 LAB — VITAMIN D 25 HYDROXY (VIT D DEFICIENCY, FRACTURES): VITD: 11.94 ng/mL — ABNORMAL LOW (ref 30.00–100.00)

## 2015-03-01 LAB — HEMOGLOBIN A1C: HEMOGLOBIN A1C: 7 % — AB (ref 4.6–6.5)

## 2015-03-01 LAB — LDL CHOLESTEROL, DIRECT: LDL DIRECT: 69 mg/dL

## 2015-03-01 LAB — RHEUMATOID FACTOR: Rhuematoid fact SerPl-aCnc: <10 IU/mL (ref <=14)

## 2015-03-01 LAB — TSH: TSH: 1.97 u[IU]/mL (ref 0.35–4.50)

## 2015-03-01 MED ORDER — CYANOCOBALAMIN 1000 MCG/ML IJ SOLN
1000.0000 ug | Freq: Once | INTRAMUSCULAR | Status: AC
  Administered 2015-03-01: 1000 ug via INTRAMUSCULAR

## 2015-03-01 MED ORDER — PNEUMOCOCCAL 13-VAL CONJ VACC IM SUSP: 0.5000 mL | INTRAMUSCULAR | Status: DC

## 2015-03-01 MED ORDER — CYANOCOBALAMIN 1000 MCG SL SUBL: 1.0000 | SUBLINGUAL_TABLET | Freq: Every day | SUBLINGUAL | Status: DC

## 2015-03-01 NOTE — Progress Notes (Signed)
Pre visit review using our clinic review tool, if applicable. No additional management support is needed unless otherwise documented below in the visit note. 

## 2015-03-01 NOTE — Progress Notes (Signed)
Subjective:  Patient ID: Madeline Strickland, female    DOB: 04/05/1971  Age: 44 y.o. MRN: [id number]  CC: The primary encounter diagnosis was Polyarthritis. Diagnoses of Hypersomnolence, Obesity hypoventilation syndrome ([place]), Diabetes mellitus type 2 in obese ([place]), Screen for STD (sexually transmitted disease), Hypothyroidism, unspecified hypothyroidism type, Vitamin D deficiency, Breast cancer screening, Need for prophylactic vaccination against Streptococcus pneumoniae (pneumococcus), B12 deficiency, Fatigue due to sleep pattern disturbance, Vitamin B12 deficiency anemia due to intrinsic factor deficiency, Obesity (BMI 30-39.9), Hypothyroidism due to acquired atrophy of thyroid, Hyperlipidemia associated with type 2 diabetes mellitus ([place]), Elevated alkaline phosphatase measurement, and Hirsutism were also pertinent to this visit.  HPI Madeline Strickland presents for follow up on multiple chronic issues.  A total of 40 minutes was spent in face to face time with patient today due to the complexity of her complaints.    1) persistent fatigue.  2 Episodes of dosing off while driving recently.  Falling asleep during the day,.  Has a 27 minute commute to work.  Snores,  Has a large neck.  Has gained weight.  BMI was 37 at [place] visit in July and she refused weight today.   There is no weight on file to calculate BMI.  Has actually snorted on several occasions while sitting still and  awake.    Discussed getting seep study at [place] on  Two prior occasions but patient has continually deferred due to commute to work.   2) Obesity :  Not exercising ,  Husband weighs 425 lb s and does not support any efforts  To change..  Not taking phentermine due to lack of efficacy.   3) history of asthma:  Short of breath with minimal exertion. NO recent epsidoes consistent with asthma exacerbation.  4) Pain.  Was told by massage therapist she may have FM>  d pain in multiple joints including lower back ,  Bilateral wrists,  Knees,  ankles ,hips.  Having random episosdess of muscle spasms    5) chest pain:  Reports persistent tendnerness on her left anterior chest wall.  No mammogram in years.    6) B12 deficiency:Needs refill on b12 last injection was 2 months ago want so try sublingual  7) DM Type 2:  Following a low carb diet.  Using Lantus minimal dose. Checks sugars occasionally, fasting all < 120    Outpatient Prescriptions Prior to Visit  Medication Sig Dispense Refill  . ADVAIR DISKUS 250-50 MCG/DOSE AEPB INHALE 1 PUFF INTO THE LUNGS TWICE DAILY 60 each 0  . albuterol (ACCUNEB) 1.25 MG/3ML nebulizer solution Take 3 mLs (1.25 mg total) by nebulization every 6 (six) hours as needed for wheezing. 75 mL 12  . ALPRAZolam (XANAX) 0.5 MG tablet Take 0.5 tablets (0.25 mg total) by mouth 2 (two) times daily. As needed for anxiety or insomnia 30 tablet 5  . Alum & Mag Hydroxide-Simeth (MAALOX ADVANCED PO) Take by mouth at bedtime.      Marland Kitchen b complex vitamins tablet Take 1 tablet by mouth daily.      . B-D UF III MINI PEN NEEDLES 31G X 5 MM MISC USE AS DIRECTED THREE TIMES DAILY 100 each 0  . Blood Glucose Monitoring Suppl (ONE TOUCH ULTRA SYSTEM KIT) W/DEVICE KIT 1 kit by Does not apply route once. 1 each 0  . famotidine (PEPCID) 20 MG tablet TAKE 1 TABLET(20 MG) BY MOUTH TWICE DAILY 60 tablet 11  . ferrous fumarate (HEMOCYTE - 106 MG FE) 325 (106  FE) MG TABS Take 1 tablet by mouth.      . furosemide (LASIX) 20 MG tablet Take 1 tablet (20 mg total) by mouth daily. As needed for fluid retention 30 tablet 3  . glucose blood (FREESTYLE TEST STRIPS) test strip Use as instructed 100 each 5  . Insulin Glargine (LANTUS SOLOSTAR) 100 UNIT/ML Solostar Pen Inject 0.02 mLs (2 Units total) into the skin at bedtime. 5 pen 1  . Lactobacillus-Inulin ([place]) CAPS Take 1 capsule by mouth daily. 14 capsule 0  . Lancets (ONETOUCH ULTRASOFT) lancets Use as instructed 100 each 12  . levothyroxine (SYNTHROID, LEVOTHROID)  175 MCG tablet TAKE 1 TABLET BY MOUTH EVERY DAY 30 tablet 0  . metoprolol succinate (TOPROL-XL) 50 MG 24 hr tablet TAKE 1 TABLET BY MOUTH EVERY DAY 30 tablet 5  . metoprolol succinate (TOPROL-XL) 50 MG 24 hr tablet TAKE 1 TABLET BY MOUTH EVERY DAY 30 tablet 5  . mometasone (ELOCON) 0.1 % cream Apply 1 application topically daily. In ears as needed 45 g 0  . montelukast (SINGULAIR) 10 MG tablet TAKE 1 TABLET BY MOUTH AT BEDTIME 90 tablet 1  . mupirocin ointment (BACTROBAN) 2 % Apply 1 application topically 2 (two) times daily. On affected ear 22 g 0  . NEXIUM 40 MG capsule TAKE ONE CAPSULE BY MOUTH EVERY DAY 30 capsule 0  . nystatin (MYCOSTATIN) powder Apply topically 2 (two) times daily. 15 g 3  . PROVENTIL HFA 108 (90 BASE) MCG/ACT inhaler INHALE 2 PUFFS INTO THE LUNGS EVERY 6 HOURS AS NEEDED FOR WHEEZING OR SHORTNESS OF BREATH 6.7 g 2  . spironolactone (ALDACTONE) 100 MG tablet TAKE 1 TABLET BY MOUTH TWICE DAILY. 180 tablet 0  . Syringe/Needle, Disp, (SYRINGE 3CC/25GX1") 25G X 1" 3 ML MISC Use as directed 15 each 0  . cyanocobalamin (,VITAMIN B-12,) 1000 MCG/ML injection Inject 1 mL (1,000 mcg total) into the muscle every 30 (thirty) days. 10 mL 1  . levothyroxine (SYNTHROID, LEVOTHROID) 175 MCG tablet TAKE 1 TABLET BY MOUTH EVERY DAY 30 tablet 5  . metoprolol succinate (TOPROL-XL) 50 MG 24 hr tablet TAKE 1 TABLET BY MOUTH EVERY DAY 30 tablet 3  . metoprolol succinate (TOPROL-XL) 50 MG 24 hr tablet TAKE 1 TABLET BY MOUTH EVERY DAY 30 tablet 0  . phentermine (ADIPEX-P) 37.5 MG tablet 1/2 tablet twice daily before meals (Patient not taking: Reported on 03/01/2015) 30 tablet 2   No facility-administered medications prior to visit.    Review of Systems;  Patient denies headache, fevers, malaise, unintentional weight loss, skin rash, eye pain, sinus congestion and sinus pain, sore throat, dysphagia,  hemoptysis , cough, dyspnea, wheezing, chest pain, palpitations, orthopnea, edema, abdominal pain,  nausea, melena, diarrhea, constipation, flank pain, dysuria, hematuria, urinary  Frequency, nocturia, numbness, tingling, seizures,  Focal weakness, Loss of consciousness,  Tremor, insomnia, depression, anxiety, and suicidal ideation.      Objective:  BP 118/74 mmHg  Pulse 64  Temp(Src) 98.6 F (37 C) (Oral)  Resp 15  Ht 5' 8"  (1.727 m)  SpO2 98%  BP Readings from Last 3 Encounters:  03/01/15 118/74  10/22/14 126/78  02/16/14 110/78    Wt Readings from Last 3 Encounters:  10/22/14 241 lb 4 oz (109.43 kg)  02/16/14 238 lb (107.956 kg)  09/18/13 228 lb 8 oz (103.647 kg)    General appearance: alert, cooperative and appears stated age Ears: normal TM's and external ear canals both ears Throat: lips, mucosa, and tongue normal;  teeth and gums normal Neck: no adenopathy, no carotid bruit, supple, symmetrical, trachea midline and thyroid not enlarged, symmetric, no tenderness/mass/nodules Back: symmetric, no curvature. ROM normal. No CVA tenderness. Lungs: clear to auscultation bilaterally Heart: regular rate and rhythm, S1, S2 normal, no murmur, click, rub or gallop Abdomen: soft, non-tender; bowel sounds normal; no masses,  no organomegaly Pulses: 2+ and symmetric Skin: Skin color, texture, turgor normal. No rashes or lesions Lymph nodes: Cervical, supraclavicular, and axillary nodes normal.  Lab Results  Component Value Date   HGBA1C 7.0* 03/01/2015   HGBA1C 6.4* 10/22/2014   HGBA1C 6.6* 02/15/2014    Lab Results  Component Value Date   CREATININE 1.03 03/01/2015   CREATININE 1.01 10/22/2014   CREATININE 1.2 02/15/2014    Lab Results  Component Value Date   WBC 16.0* 03/01/2015   HGB 12.3 03/01/2015   HCT 38.5 03/01/2015   PLT 324.0 03/01/2015   GLUCOSE 128* 03/01/2015   CHOL 112 03/01/2015   TRIG 98.0 03/01/2015   HDL 33.20* 03/01/2015   LDLDIRECT 69.0 03/01/2015   LDLCALC 59 03/01/2015   ALT 36* 03/01/2015   AST 27 03/01/2015   NA 138 03/01/2015   K 4.5  03/01/2015   CL 104 03/01/2015   CREATININE 1.03 03/01/2015   BUN 13 03/01/2015   CO2 28 03/01/2015   TSH 1.97 03/01/2015   HGBA1C 7.0* 03/01/2015   MICROALBUR 0.9 03/01/2015    No results found.  Assessment & Plan:   Problem List Items Addressed This Visit    Obesity (BMI 30-39.9)    I have addressed  BMI and recommended wt loss of 10% of body weigh over the next 6 months using a low glycemic index diet and regular exercise a minimum of 5 days per week. Trial of phentermine was unsuccessful.  Referral to Bariatric Center at [facility] offered and accepted.           Hypothyroidism due to acquired atrophy of thyroid    Lab Results  Component Value Date   TSH 1.97 03/01/2015   Thyroid function is WNL on current dose.  No current changes needed.         Hirsutism    Secondary to PCOS.  continueu spironolactone. Potassium normal.   Lab Results  Component Value Date   NA 138 03/01/2015   K 4.5 03/01/2015   CL 104 03/01/2015   CO2 28 03/01/2015         Diabetes mellitus type 2 in obese ([place])    Well-controlled on diet and minimal medications, but  hemoglobin A1c has risen to 7.0 . sHe is up-to-date on eye exams and her foot exam is normal. SHe is on the appropriate medications. Lab Results  Component Value Date   HGBA1C 7.0* 03/01/2015   Lab Results  Component Value Date   MICROALBUR 0.9 03/01/2015                 Relevant Orders   Comprehensive metabolic panel (Completed)   Hemoglobin A1c (Completed)   LDL cholesterol, direct (Completed)   Microalbumin / creatinine urine ratio (Completed)   Lipid panel (Completed)   Ambulatory referral to General Surgery   Hyperlipidemia associated with type 2 diabetes mellitus ([place])    LDL and triglycerides are at goal on diet alone.  Will not recommend statin   Lab Results  Component Value Date   CHOL 112 03/01/2015   HDL 33.20* 03/01/2015   LDLCALC 59 03/01/2015   LDLDIRECT 69.0 03/01/2015  TRIG 98.0  03/01/2015   CHOLHDL 3 03/01/2015    Lab Results  Component Value Date   ALT 36* 03/01/2015   AST 27 03/01/2015   ALKPHOS 124* 03/01/2015   BILITOT 0.3 03/01/2015             Elevated alkaline phosphatase measurement    She is s/.p cholecystectomy.  Etiology may be fatty liver.  Ultrasound ordered and scheduled last year but never done.   Lab Results  Component Value Date   ALT 36* 03/01/2015   AST 27 03/01/2015   ALKPHOS 124* 03/01/2015   BILITOT 0.3 03/01/2015            Fatigue due to sleep pattern disturbance    Suspect OSA and possible OHS given body habitus and reports of snorting while awake,  Random muscle spasms,  and daytime somnolence       Polyarthritis - Primary    Weight bearing joints affected.. Suspect simple OA compounded by morbid obesity. Screening labs done to rule out autoimmune disorders,  All normal.   Lab Results  Component Value Date   ESRSEDRATE 34* 03/01/2015   Lab Results  Component Value Date   CRP 0.4* 03/01/2015   Lab Results  Component Value Date   ANA Negative 03/01/2015   RF <10 03/01/2015         Relevant Orders   CBC with Differential/Platelet (Completed)   Hepatitis C antibody (Completed)   Sedimentation rate (Completed)   C-reactive protein (Completed)   Cyclic citrul peptide antibody, IgG (Completed)   Rheumatoid factor (Completed)   ANA w/Reflex if Positive (Completed)   Vitamin B12 deficiency anemia due to intrinsic factor deficiency    By history. Resume B12 supplementation with sublingual supplements and check level at next visit,   No results found for: VITAMINB12       Relevant Medications   Cyanocobalamin 1000 MCG SUBL   cyanocobalamin ((VITAMIN B-12)) injection 1,000 mcg (Completed)    Other Visit Diagnoses    Hypersomnolence        Relevant Orders    Nocturnal polysomnography (NPSG)    Obesity hypoventilation syndrome (HCC)        Relevant Orders    CBC with Differential/Platelet  (Completed)    Nocturnal polysomnography (NPSG)    Ambulatory referral to General Surgery    Screen for STD (sexually transmitted disease)        Relevant Orders    HIV antibody (Completed)    Hepatitis C antibody (Completed)    Hypothyroidism, unspecified hypothyroidism type        Relevant Orders    TSH (Completed)    Vitamin D deficiency        Relevant Orders    Vit D  25 hydroxy (rtn osteoporosis monitoring) (Completed)    Breast cancer screening        Relevant Orders    MM DIGITAL SCREENING BILATERAL    Need for prophylactic vaccination against Streptococcus pneumoniae (pneumococcus)        Relevant Orders    Pneumococcal conjugate vaccine 13-valent (Completed)    B12 deficiency        Relevant Medications    cyanocobalamin ((VITAMIN B-12)) injection 1,000 mcg (Completed)     A total of 40 minutes was spent with patient more than half of which was spent in counseling patient on the above mentioned issues ,  explaining recent labs and imaging studies ordered, and coordination of care.  I have discontinued Ms. Hastings phentermine and  cyanocobalamin. I am also having her start on Cyanocobalamin. Additionally, I am having her maintain her Alum & Mag Hydroxide-Simeth (MAALOX ADVANCED PO), b complex vitamins, ferrous fumarate, albuterol, metoprolol succinate, glucose blood, ONE TOUCH ULTRA SYSTEM KIT, onetouch ultrasoft, C[employer], furosemide, NEXIUM, SYRINGE 3CC/25GX1", Insulin Glargine, montelukast, nystatin, mupirocin ointment, mometasone, spironolactone, ALPRAZolam, B-D UF III MINI PEN NEEDLES, levothyroxine, metoprolol succinate, ADVAIR DISKUS, famotidine, and PROVENTIL HFA. We administered cyanocobalamin.  Meds ordered this encounter  Medications  . Cyanocobalamin 1000 MCG SUBL    Sig: Place 1 tablet (1,000 mcg total) under the tongue daily.    Dispense:  90 tablet    Refill:  3  . DISCONTD: pneumococcal 13-valent conjugate vaccine (PREVNAR 13) injection 0.5 mL     Sig:   . cyanocobalamin ((VITAMIN B-12)) injection 1,000 mcg    Sig:     Medications Discontinued During This Encounter  Medication Reason  . levothyroxine (SYNTHROID, LEVOTHROID) 947 MCG tablet Duplicate  . metoprolol succinate (TOPROL-XL) 50 MG 24 hr tablet Duplicate  . metoprolol succinate (TOPROL-XL) 50 MG 24 hr tablet Duplicate  . cyanocobalamin (,VITAMIN B-12,) 1000 MCG/ML injection   . pneumococcal 13-valent conjugate vaccine (PREVNAR 13) injection 0.5 mL   . phentermine (ADIPEX-P) 37.5 MG tablet     Follow-up: Return in about 4 weeks (around 03/29/2015).   Crecencio Mc, MD

## 2015-03-01 NOTE — Patient Instructions (Addendum)
Your fatigue is NOT chronic fatigue. Based on your symptoms you have undiagnosed sleep apnea and possibly Obesity Hypoventilation syndrome  Sleep study has been ordered  For you.   I highly recommend conside bariatric surgery .  Try the Premier Protein chocolate shakes  For weight loss.  These can be continued when you have had surgery  Mammogram  Has been ordered   Your joint pain does not sound like fibromyalgia.  It is due to inactivity,  Loss of muscle tone,  And arthritis .  I am running additional tests today  I am switching you to sublingual B12 tablets to take Daily INSTEAD OF INJECTIONS

## 2015-03-02 LAB — HEPATITIS C ANTIBODY: HCV Ab: NEGATIVE

## 2015-03-02 LAB — ANA W/REFLEX IF POSITIVE: Anti Nuclear Antibody(ANA): NEGATIVE

## 2015-03-02 LAB — HIV ANTIBODY (ROUTINE TESTING W REFLEX): HIV: NONREACTIVE

## 2015-03-02 LAB — CYCLIC CITRUL PEPTIDE ANTIBODY, IGG: Cyclic Citrullin Peptide Ab: <16 Units

## 2015-03-03 ENCOUNTER — Encounter: Payer: Self-pay | Admitting: Internal Medicine

## 2015-03-03 DIAGNOSIS — M130 Polyarthritis, unspecified: Secondary | ICD-10-CM | POA: Insufficient documentation

## 2015-03-03 DIAGNOSIS — R5383 Other fatigue: Secondary | ICD-10-CM

## 2015-03-03 DIAGNOSIS — G479 Sleep disorder, unspecified: Secondary | ICD-10-CM | POA: Insufficient documentation

## 2015-03-03 DIAGNOSIS — D510 Vitamin B12 deficiency anemia due to intrinsic factor deficiency: Secondary | ICD-10-CM | POA: Insufficient documentation

## 2015-03-03 DIAGNOSIS — E559 Vitamin D deficiency, unspecified: Secondary | ICD-10-CM | POA: Insufficient documentation

## 2015-03-03 MED ORDER — VITAMIN D (ERGOCALCIFEROL) 1.25 MG (50000 UNIT) PO CAPS: 50000.0000 [IU] | ORAL_CAPSULE | ORAL | Status: DC

## 2015-03-03 NOTE — Assessment & Plan Note (Signed)
By history. Resume B12 supplementation with sublingual supplements and check level at next visit,   No results found for: [id number]

## 2015-03-03 NOTE — Assessment & Plan Note (Signed)
LDL and triglycerides are at goal on diet alone.  Will not recommend statin   Lab Results  Component Value Date   CHOL 112 03/01/2015   HDL 33.20* 03/01/2015   LDLCALC 59 03/01/2015   LDLDIRECT 69.0 03/01/2015   TRIG 98.0 03/01/2015   CHOLHDL 3 03/01/2015    Lab Results  Component Value Date   ALT 36* 03/01/2015   AST 27 03/01/2015   ALKPHOS 124* 03/01/2015   BILITOT 0.3 03/01/2015

## 2015-03-03 NOTE — Assessment & Plan Note (Signed)
Well-controlled on diet and minimal medications, but  hemoglobin A1c has risen to 7.0 . sHe is up-to-date on eye exams and her foot exam is normal. SHe is on the appropriate medications. Lab Results  Component Value Date   HGBA1C 7.0* 03/01/2015   Lab Results  Component Value Date   MICROALBUR 0.9 03/01/2015

## 2015-03-03 NOTE — Assessment & Plan Note (Signed)
Secondary to PCOS.  continueu spironolactone. Potassium normal.   Lab Results  Component Value Date   NA 138 03/01/2015   K 4.5 03/01/2015   CL 104 03/01/2015   CO2 28 03/01/2015

## 2015-03-03 NOTE — Assessment & Plan Note (Signed)
Weight bearing joints affected.. Suspect simple OA compounded by morbid obesity. Screening labs done to rule out autoimmune disorders,  All normal.   Lab Results  Component Value Date   ESRSEDRATE 34* 03/01/2015   Lab Results  Component Value Date   CRP 0.4* 03/01/2015   Lab Results  Component Value Date   ANA Negative 03/01/2015   RF <10 03/01/2015

## 2015-03-03 NOTE — Assessment & Plan Note (Signed)
Lab Results  Component Value Date   TSH 1.97 03/01/2015   Thyroid function is WNL on current dose.  No current changes needed.

## 2015-03-03 NOTE — Assessment & Plan Note (Signed)
Suspect OSA and possible OHS given body habitus and reports of snorting while awake,  Random muscle spasms,  and daytime somnolence

## 2015-03-03 NOTE — Addendum Note (Signed)
Addended by: Sherlene ShamsULLO, Elanna Bert L on: 03/03/2015 05:44 PM   Modules accepted: Orders

## 2015-03-03 NOTE — Assessment & Plan Note (Signed)
She is s/.p cholecystectomy.  Etiology may be fatty liver.  Ultrasound ordered and scheduled last year but never done.   Lab Results  Component Value Date   ALT 36* 03/01/2015   AST 27 03/01/2015   ALKPHOS 124* 03/01/2015   BILITOT 0.3 03/01/2015

## 2015-03-03 NOTE — Assessment & Plan Note (Signed)
I have addressed  BMI and recommended wt loss of 10% of body weigh over the next 6 months using a low glycemic index diet and regular exercise a minimum of 5 days per week. Trial of phentermine was unsuccessful.  [facility] and [facility].

## 2015-03-05 ENCOUNTER — Other Ambulatory Visit: Payer: Self-pay | Admitting: Internal Medicine

## 2015-03-29 ENCOUNTER — Ambulatory Visit: Payer: Self-pay | Admitting: Internal Medicine

## 2015-03-29 ENCOUNTER — Ambulatory Visit: Payer: BC Managed Care – PPO | Admitting: Internal Medicine

## 2015-04-01 ENCOUNTER — Ambulatory Visit: Payer: BC Managed Care – PPO | Attending: Otolaryngology

## 2015-04-01 DIAGNOSIS — G4733 Obstructive sleep apnea (adult) (pediatric): Secondary | ICD-10-CM | POA: Insufficient documentation

## 2015-04-01 DIAGNOSIS — R0683 Snoring: Secondary | ICD-10-CM | POA: Insufficient documentation

## 2015-04-06 ENCOUNTER — Telehealth: Payer: Self-pay | Admitting: Internal Medicine

## 2015-04-06 DIAGNOSIS — G4733 Obstructive sleep apnea (adult) (pediatric): Secondary | ICD-10-CM

## 2015-04-06 NOTE — Telephone Encounter (Signed)
Left message for patient to return call to office. 

## 2015-04-06 NOTE — Assessment & Plan Note (Signed)
Mild to moderate by recent study.  CPAP titration study ordered   

## 2015-04-06 NOTE — Telephone Encounter (Signed)
Sleep study confirmed that patient has sleep apnea,  but will need a second study to determine paramters for CPAP use.  This has been ordered  

## 2015-04-08 ENCOUNTER — Ambulatory Visit: Payer: Self-pay | Admitting: Internal Medicine

## 2015-04-08 ENCOUNTER — Encounter: Payer: Self-pay | Admitting: Internal Medicine

## 2015-04-08 ENCOUNTER — Ambulatory Visit (INDEPENDENT_AMBULATORY_CARE_PROVIDER_SITE_OTHER): Payer: BC Managed Care – PPO | Admitting: Internal Medicine

## 2015-04-08 VITALS — BP 112/68 | HR 71 | Temp 98.6°F | Wt 250.0 lb

## 2015-04-08 DIAGNOSIS — E669 Obesity, unspecified: Secondary | ICD-10-CM | POA: Diagnosis not present

## 2015-04-08 DIAGNOSIS — G4733 Obstructive sleep apnea (adult) (pediatric): Secondary | ICD-10-CM

## 2015-04-08 DIAGNOSIS — E559 Vitamin D deficiency, unspecified: Secondary | ICD-10-CM | POA: Diagnosis not present

## 2015-04-08 DIAGNOSIS — K121 Other forms of stomatitis: Secondary | ICD-10-CM

## 2015-04-08 DIAGNOSIS — D510 Vitamin B12 deficiency anemia due to intrinsic factor deficiency: Secondary | ICD-10-CM

## 2015-04-08 DIAGNOSIS — Z720 Tobacco use: Secondary | ICD-10-CM

## 2015-04-08 MED ORDER — CHLORHEXIDINE GLUCONATE 0.12% ORAL RINSE (MEDLINE KIT): 15.0000 mL | Freq: Two times a day (BID) | OROMUCOSAL | Status: DC

## 2015-04-08 MED ORDER — ALPRAZOLAM 0.5 MG PO TABS: 0.5000 mg | ORAL_TABLET | Freq: Two times a day (BID) | ORAL | Status: DC

## 2015-04-08 NOTE — Progress Notes (Signed)
Subjective:  Patient ID: Madeline Strickland, female    DOB: 1970/06/18  Age: 44 y.o. MRN: [id number]  CC: The primary encounter diagnosis was Obesity. Diagnoses of OSA (obstructive sleep apnea), Tobacco abuse, Vitamin D deficiency, Vitamin B12 deficiency anemia due to intrinsic factor deficiency, Obstructive sleep apnea, Mouth ulceration, and Obesity (BMI 30-39.9) were also pertinent to this visit.  HPI [name] presents for follow up on   1) positive sleep study.  She was diagnosed with moderately severe OSA by recent study but Titration study is required to determine CPAP settings.  She recalls that she had a difficult time tolerating the full face mask,  And it triggered a Panic attack,  So she had to take another 1/2 tablet of  alprazolam to sleep.    2)  Leg pain : her pain significantly improved with treatment of low Vit D  .     3) Obesity :  She has gained all of  The weight she initially lost last year when she was diagnosed with DM ,   despite limiting her carbohydrate intake to 50 g or less.  And  walking daily for exercise.   Treied eating every 2-3 hours to stimulate metabolism,  Low appetite.  She is very frustrated  Requesitng referral for bariatric surgery, despite the recent death of friend from malnutrition which was the complication of bariatric surgery done 3 years ago at [facility] (gastric sleeve) .  Marland Kitchen  Discussed  referral to Dr Duke Salvia .  4) Right upper gum pain:  started hurting last night.  History of incomplete tooth extraction several months ago by Dr Bronson Curb,  Was advised to return if she developed pain or swelling  Outpatient Prescriptions Prior to Visit  Medication Sig Dispense Refill  . ADVAIR DISKUS 250-50 MCG/DOSE AEPB INHALE 1 PUFF INTO THE LUNGS TWICE DAILY 60 each 0  . Alum & Mag Hydroxide-Simeth (MAALOX ADVANCED PO) Take by mouth at bedtime.      Marland Kitchen b complex vitamins tablet Take 1 tablet by mouth daily.      . B-D UF III MINI PEN NEEDLES 31G X 5 MM MISC USE  AS DIRECTED THREE TIMES DAILY 100 each 0  . Blood Glucose Monitoring Suppl (ONE TOUCH ULTRA SYSTEM KIT) W/DEVICE KIT 1 kit by Does not apply route once. 1 each 0  . Cyanocobalamin 1000 MCG SUBL Place 1 tablet (1,000 mcg total) under the tongue daily. 90 tablet 3  . famotidine (PEPCID) 20 MG tablet TAKE 1 TABLET(20 MG) BY MOUTH TWICE DAILY 60 tablet 11  . ferrous fumarate (HEMOCYTE - 106 MG FE) 325 (106 FE) MG TABS Take 1 tablet by mouth.      . furosemide (LASIX) 20 MG tablet Take 1 tablet (20 mg total) by mouth daily. As needed for fluid retention 30 tablet 3  . glucose blood (FREESTYLE TEST STRIPS) test strip Use as instructed 100 each 5  . Insulin Glargine (LANTUS SOLOSTAR) 100 UNIT/ML Solostar Pen Inject 0.02 mLs (2 Units total) into the skin at bedtime. 5 pen 1  . Lactobacillus-Inulin ([place]) CAPS Take 1 capsule by mouth daily. 14 capsule 0  . Lancets (ONETOUCH ULTRASOFT) lancets Use as instructed 100 each 12  . levothyroxine (SYNTHROID, LEVOTHROID) 175 MCG tablet TAKE 1 TABLET BY MOUTH EVERY DAY 30 tablet 0  . metoprolol succinate (TOPROL-XL) 50 MG 24 hr tablet TAKE 1 TABLET BY MOUTH EVERY DAY 30 tablet 5  . metoprolol succinate (TOPROL-XL) 50 MG  24 hr tablet TAKE 1 TABLET BY MOUTH EVERY DAY 30 tablet 5  . mometasone (ELOCON) 0.1 % cream Apply 1 application topically daily. In ears as needed 45 g 0  . montelukast (SINGULAIR) 10 MG tablet TAKE 1 TABLET BY MOUTH AT BEDTIME 90 tablet 1  . mupirocin ointment (BACTROBAN) 2 % Apply 1 application topically 2 (two) times daily. On affected ear 22 g 0  . NEXIUM 40 MG capsule TAKE ONE CAPSULE BY MOUTH EVERY DAY 30 capsule 0  . nystatin (MYCOSTATIN) powder Apply topically 2 (two) times daily. 15 g 3  . PROVENTIL HFA 108 (90 BASE) MCG/ACT inhaler INHALE 2 PUFFS INTO THE LUNGS EVERY 6 HOURS AS NEEDED FOR WHEEZING OR SHORTNESS OF BREATH 6.7 g 2  . spironolactone (ALDACTONE) 100 MG tablet TAKE 1 TABLET BY MOUTH TWICE DAILY 180 tablet  0  . Syringe/Needle, Disp, (SYRINGE 3CC/25GX1") 25G X 1" 3 ML MISC Use as directed 15 each 0  . Vitamin D, Ergocalciferol, (DRISDOL) 50000 UNITS CAPS capsule Take 1 capsule (50,000 Units total) by mouth every 7 (seven) days. 12 capsule 0  . ALPRAZolam (XANAX) 0.5 MG tablet Take 0.5 tablets (0.25 mg total) by mouth 2 (two) times daily. As needed for anxiety or insomnia 30 tablet 5  . albuterol (ACCUNEB) 1.25 MG/3ML nebulizer solution Take 3 mLs (1.25 mg total) by nebulization every 6 (six) hours as needed for wheezing. 75 mL 12   No facility-administered medications prior to visit.    Review of Systems;  Patient denies headache, fevers, malaise, unintentional weight loss, skin rash, eye pain, sinus congestion and sinus pain, sore throat, dysphagia,  hemoptysis , cough, dyspnea, wheezing, chest pain, palpitations, orthopnea, edema, abdominal pain, nausea, melena, diarrhea, constipation, flank pain, dysuria, hematuria, urinary  Frequency, nocturia, numbness, tingling, seizures,  Focal weakness, Loss of consciousness,  Tremor, insomnia, depression, anxiety, and suicidal ideation.      Objective:  BP 112/68 mmHg  Pulse 71  Temp(Src) 98.6 F (37 C) (Oral)  Wt 250 lb (113.399 kg)  SpO2 97%  BP Readings from Last 3 Encounters:  04/08/15 112/68  03/01/15 118/74  10/22/14 126/78    Wt Readings from Last 3 Encounters:  04/08/15 250 lb (113.399 kg)  10/22/14 241 lb 4 oz (109.43 kg)  02/16/14 238 lb (107.956 kg)    General appearance: alert, cooperative and appears stated age Ears: normal TM's and external ear canals both ears Throat: lips, mucosa, and tongue normal; teeth and gums normal Neck: no adenopathy, no carotid bruit, supple, symmetrical, trachea midline and thyroid not enlarged, symmetric, no tenderness/mass/nodules Back: symmetric, no curvature. ROM normal. No CVA tenderness. Lungs: clear to auscultation bilaterally Heart: regular rate and rhythm, S1, S2 normal, no murmur,  click, rub or gallop Abdomen: soft, non-tender; bowel sounds normal; no masses,  no organomegaly Pulses: 2+ and symmetric Skin: Skin color, texture, turgor normal. No rashes or lesions Lymph nodes: Cervical, supraclavicular, and axillary nodes normal.  Lab Results  Component Value Date   HGBA1C 7.0* 03/01/2015   HGBA1C 6.4* 10/22/2014   HGBA1C 6.6* 02/15/2014    Lab Results  Component Value Date   CREATININE 1.03 03/01/2015   CREATININE 1.01 10/22/2014   CREATININE 1.2 02/15/2014    Lab Results  Component Value Date   WBC 16.0* 03/01/2015   HGB 12.3 03/01/2015   HCT 38.5 03/01/2015   PLT 324.0 03/01/2015   GLUCOSE 128* 03/01/2015   CHOL 112 03/01/2015   TRIG 98.0 03/01/2015   HDL 33.20* 03/01/2015  LDLDIRECT 69.0 03/01/2015   LDLCALC 59 03/01/2015   ALT 36* 03/01/2015   AST 27 03/01/2015   NA 138 03/01/2015   K 4.5 03/01/2015   CL 104 03/01/2015   CREATININE 1.03 03/01/2015   BUN 13 03/01/2015   CO2 28 03/01/2015   TSH 1.97 03/01/2015   HGBA1C 7.0* 03/01/2015   MICROALBUR 0.9 03/01/2015    No results found.  Assessment & Plan:   Problem List Items Addressed This Visit    Obstructive sleep apnea    Mild to moderate by recent study.  CPAP titration study ordered        Obesity (BMI 30-39.9)    She has been unable to reduce her BMI significantly with dietary restriction due to decreased appetite . She is requesting referral to [facility] surgeon for discussion of surgical therapies       Tobacco abuse     She is currently smoking 1-2 per day . Smoking cessation instruction/counseling given:  counseled patient on the dangers of tobacco use, advised patient to stop smoking, and reviewed strategies to maximize success      Vitamin B12 deficiency anemia due to intrinsic factor deficiency    By history. Resuming B12 supplementation with sublingual supplements and check level at next lab draw.         Vitamin D deficiency    Now on supplementation with  improvement in leg pain.       Mouth ulceration    Secondary to incomplete molar extraction.  Chlorhexidine rinse bid,  Return to Dr/ Tiburcio Pea.        Other Visit Diagnoses    Obesity    -  Primary    Relevant Orders    Ambulatory referral to General Surgery    OSA (obstructive sleep apnea)        Relevant Orders    Ambulatory referral to General Surgery      A total of 25 minutes of face to face time was spent with patient more than half of which was spent in counselling about the above mentioned conditions  and coordination of care   I have changed Ms. Krumholz's ALPRAZolam. I am also having her maintain her Alum & Mag Hydroxide-Simeth (MAALOX ADVANCED PO), b complex vitamins, ferrous fumarate, albuterol, metoprolol succinate, glucose blood, ONE TOUCH ULTRA SYSTEM KIT, onetouch ultrasoft, [place], furosemide, NEXIUM, SYRINGE 3CC/25GX1", Insulin Glargine, montelukast, nystatin, mupirocin ointment, mometasone, B-D UF III MINI PEN NEEDLES, levothyroxine, metoprolol succinate, famotidine, PROVENTIL HFA, Cyanocobalamin, Vitamin D (Ergocalciferol), spironolactone, ADVAIR DISKUS, chlorhexidine gluconate, and chlorhexidine gluconate.  Meds ordered this encounter  Medications  . ALPRAZolam (XANAX) 0.5 MG tablet    Sig: Take 1 tablet (0.5 mg total) by mouth 2 (two) times daily. As needed for anxiety or insomnia    Dispense:  60 tablet    Refill:  5  . DISCONTD: chlorhexidine gluconate (PERIDEX) 0.12 % solution    Sig: Use as directed 15 mLs in the mouth or throat 2 (two) times daily.    Dispense:  120 mL    Refill:  0  . chlorhexidine gluconate (PERIDEX) 0.12 % solution    Sig: Use as directed 15 mLs in the mouth or throat 2 (two) times daily.    Dispense:  120 mL    Refill:  0  . chlorhexidine gluconate (PERIDEX) 0.12 % solution    Sig: Use as directed 15 mLs in the mouth or throat 2 (two) times daily.    Dispense:  120  mL    Refill:  0    Medications Discontinued  During This Encounter  Medication Reason  . ALPRAZolam (XANAX) 0.5 MG tablet Reorder  . chlorhexidine gluconate (PERIDEX) 0.12 % solution Reorder  . chlorhexidine gluconate (PERIDEX) 0.12 % solution Reorder    Follow-up: No Follow-up on file.   Crecencio Mc, MD

## 2015-04-08 NOTE — Patient Instructions (Signed)
I am refilling the alprazolam so you can take a whole tablet at bedtime to help you adjust to CPAP  I am prescribing chlorhexidine rinse to use twice daily for your tooth  You can use oragel or anbesol on the gum.  Call Dr Danford BadVan Dre    Referral to Dr Alva Garnetyner is underway

## 2015-04-10 ENCOUNTER — Encounter: Payer: Self-pay | Admitting: Internal Medicine

## 2015-04-10 DIAGNOSIS — K121 Other forms of stomatitis: Secondary | ICD-10-CM | POA: Insufficient documentation

## 2015-04-10 NOTE — Assessment & Plan Note (Signed)
Secondary to incomplete molar extraction.  Chlorhexidine rinse bid,  Return to Dr/ Danford BadVan dre.

## 2015-04-10 NOTE — Assessment & Plan Note (Signed)
By history. Resuming B12 supplementation with sublingual supplements and check level at next lab draw.

## 2015-04-10 NOTE — Assessment & Plan Note (Signed)
She has been unable to reduce her BMI significantly with dietary restriction due to decreased appetite . [facility] of [facility] therapies

## 2015-04-10 NOTE — Assessment & Plan Note (Signed)
Now on supplementation with improvement in leg pain.

## 2015-04-10 NOTE — Assessment & Plan Note (Signed)
Mild to moderate by recent study.  CPAP titration study ordered

## 2015-04-10 NOTE — Assessment & Plan Note (Signed)
She is currently smoking 1-2 per day . Smoking cessation instruction/counseling given:  counseled patient on the dangers of tobacco use, advised patient to stop smoking, and reviewed strategies to maximize success

## 2015-05-11 ENCOUNTER — Other Ambulatory Visit: Payer: Self-pay | Admitting: Internal Medicine

## 2015-06-04 ENCOUNTER — Other Ambulatory Visit: Payer: Self-pay | Admitting: Internal Medicine

## 2015-06-07 ENCOUNTER — Telehealth: Payer: Self-pay | Admitting: *Deleted

## 2015-06-07 MED ORDER — INSULIN GLARGINE 100 UNIT/ML SOLOSTAR PEN: 2.0000 [IU] | PEN_INJECTOR | Freq: Every day | SUBCUTANEOUS | Status: DC

## 2015-06-07 NOTE — Telephone Encounter (Signed)
SAME DRUG DIFFERENT NAME.  SUBSTITUTION MADE.

## 2015-06-07 NOTE — Telephone Encounter (Signed)
Pt's pharmacy called stating that insurance this year will not cover Lantus, requesting Basaglar as a replacement

## 2015-06-21 ENCOUNTER — Encounter: Payer: Self-pay | Admitting: Internal Medicine

## 2015-06-21 ENCOUNTER — Ambulatory Visit (INDEPENDENT_AMBULATORY_CARE_PROVIDER_SITE_OTHER): Payer: BC Managed Care – PPO | Admitting: Internal Medicine

## 2015-06-21 VITALS — BP 111/71 | HR 81 | Temp 99.2°F | Wt 245.0 lb

## 2015-06-21 DIAGNOSIS — R5383 Other fatigue: Secondary | ICD-10-CM

## 2015-06-21 DIAGNOSIS — J209 Acute bronchitis, unspecified: Secondary | ICD-10-CM | POA: Diagnosis not present

## 2015-06-21 LAB — POCT INFLUENZA A/B
INFLUENZA A, POC: NEGATIVE
Influenza B, POC: NEGATIVE

## 2015-06-21 MED ORDER — AMOXICILLIN-POT CLAVULANATE 875-125 MG PO TABS: 1.0000 | ORAL_TABLET | Freq: Two times a day (BID) | ORAL | Status: DC

## 2015-06-21 MED ORDER — PREDNISONE 10 MG PO TABS: ORAL_TABLET | ORAL | Status: DC

## 2015-06-21 MED ORDER — BENZONATATE 200 MG PO CAPS: 200.0000 mg | ORAL_CAPSULE | Freq: Two times a day (BID) | ORAL | Status: DC | PRN

## 2015-06-21 NOTE — Patient Instructions (Signed)
Start Prednisone taper and Augmentin to treat bronchitis.  Use Tessalon as needed.  Follow up in 2-4 weeks or sooner as needed.  Acute Bronchitis Bronchitis is inflammation of the airways that extend from the windpipe into the lungs (bronchi). The inflammation often causes mucus to develop. This leads to a cough, which is the most common symptom of bronchitis.  In acute bronchitis, the condition usually develops suddenly and goes away over time, usually in a couple weeks. Smoking, allergies, and asthma can make bronchitis worse. Repeated episodes of bronchitis may cause further lung problems.  CAUSES Acute bronchitis is most often caused by the same virus that causes a cold. The virus can spread from person to person (contagious) through coughing, sneezing, and touching contaminated objects. SIGNS AND SYMPTOMS   Cough.   Fever.   Coughing up mucus.   Body aches.   Chest congestion.   Chills.   Shortness of breath.   Sore throat.  DIAGNOSIS  Acute bronchitis is usually diagnosed through a physical exam. Your health care provider will also ask you questions about your medical history. Tests, such as chest X-rays, are sometimes done to rule out other conditions.  TREATMENT  Acute bronchitis usually goes away in a couple weeks. Oftentimes, no medical treatment is necessary. Medicines are sometimes given for relief of fever or cough. Antibiotic medicines are usually not needed but may be prescribed in certain situations. In some cases, an inhaler may be recommended to help reduce shortness of breath and control the cough. A cool mist vaporizer may also be used to help thin bronchial secretions and make it easier to clear the chest.  HOME CARE INSTRUCTIONS  Get plenty of rest.   Drink enough fluids to keep your urine clear or pale yellow (unless you have a medical condition that requires fluid restriction). Increasing fluids may help thin your respiratory secretions (sputum)  and reduce chest congestion, and it will prevent dehydration.   Take medicines only as directed by your health care provider.  If you were prescribed an antibiotic medicine, finish it all even if you start to feel better.  Avoid smoking and secondhand smoke. Exposure to cigarette smoke or irritating chemicals will make bronchitis worse. If you are a smoker, consider using nicotine gum or skin patches to help control withdrawal symptoms. Quitting smoking will help your lungs heal faster.   Reduce the chances of another bout of acute bronchitis by washing your hands frequently, avoiding people with cold symptoms, and trying not to touch your hands to your mouth, nose, or eyes.   Keep all follow-up visits as directed by your health care provider.  SEEK MEDICAL CARE IF: Your symptoms do not improve after 1 week of treatment.  SEEK IMMEDIATE MEDICAL CARE IF:  You develop an increased fever or chills.   You have chest pain.   You have severe shortness of breath.  You have bloody sputum.   You develop dehydration.  You faint or repeatedly feel like you are going to pass out.  You develop repeated vomiting.  You develop a severe headache. MAKE SURE YOU:   Understand these instructions.  Will watch your condition.  Will get help right away if you are not doing well or get worse.   This information is not intended to replace advice given to you by your health care provider. Make sure you discuss any questions you have with your health care provider.   Document Released: 05/17/2004 Document Revised: 04/30/2014 Document Reviewed: 09/30/2012 Elsevier Interactive  Patient Education 2016 [employer].

## 2015-06-21 NOTE — Assessment & Plan Note (Signed)
Symptoms and exam c/w secondary bronchitis after recent viral URI. Will start Prednisone taper and Augmentin. Discussed potential risks of these medications. She will take probiotic while on Augmentin. Add Tessalon prn cough. Rest, adequate fluids.  Follow up 2-4 weeks and sooner as needed.
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
## 2015-06-21 NOTE — Progress Notes (Signed)
Pre visit review using our clinic review tool, if applicable. No additional management support is needed unless otherwise documented below in the visit note. 

## 2015-06-27 ENCOUNTER — Encounter: Payer: Self-pay | Admitting: Internal Medicine

## 2015-06-27 ENCOUNTER — Other Ambulatory Visit: Payer: Self-pay | Admitting: Internal Medicine

## 2015-06-27 NOTE — Telephone Encounter (Signed)
Saw Dr. Dan HumphreysWalker on 06/21/15. Augmentin X 10 days and prednisone taper patient stopped prednisone due to high CBG's. Which came down after stopping prednisone.

## 2015-06-29 NOTE — Telephone Encounter (Signed)
Dr. Dan HumphreysWalker has no available appointment would want to add at 4.30 on Friday and have CXR?

## 2015-06-30 ENCOUNTER — Telehealth: Payer: Self-pay | Admitting: Internal Medicine

## 2015-06-30 NOTE — Telephone Encounter (Signed)
Madeline MessierKathy Please advise, thanks

## 2015-06-30 NOTE — Telephone Encounter (Signed)
Patient notified and scheduled 

## 2015-06-30 NOTE — Telephone Encounter (Signed)
Pt called about her sugar being in the 680 the highest two days ago. She did get it down to 400 last night. She did not check it this morning. Pt is not on any medication she controls it with her diet. Pt states what spike it up was the prednisone but she is no longer taking it. No appt avail to sch it's only a 15 min I think this needs to be longer than a 15 min. And it is a appt on 3/13 for 30 min. Pt wanted to see what the nurse said first. Call pt @ [phone]. Thank you!

## 2015-06-30 NOTE — Telephone Encounter (Signed)
Patient has Lantus insulin at home.  Pleas have her increase dose to 15 units today,  Continue until Monday unless sugars drops to < 150

## 2015-07-01 NOTE — Telephone Encounter (Signed)
Patient seeing Dr. Darrick HuntsmanUllo on 07/04/15

## 2015-07-04 ENCOUNTER — Ambulatory Visit (INDEPENDENT_AMBULATORY_CARE_PROVIDER_SITE_OTHER): Payer: BC Managed Care – PPO | Admitting: Internal Medicine

## 2015-07-04 ENCOUNTER — Encounter: Payer: Self-pay | Admitting: Internal Medicine

## 2015-07-04 VITALS — BP 124/88 | HR 98 | Temp 98.2°F | Resp 12 | Ht 68.0 in | Wt 239.5 lb

## 2015-07-04 DIAGNOSIS — E1169 Type 2 diabetes mellitus with other specified complication: Secondary | ICD-10-CM

## 2015-07-04 DIAGNOSIS — E1101 Type 2 diabetes mellitus with hyperosmolarity with coma: Secondary | ICD-10-CM | POA: Diagnosis not present

## 2015-07-04 DIAGNOSIS — J209 Acute bronchitis, unspecified: Secondary | ICD-10-CM

## 2015-07-04 DIAGNOSIS — E119 Type 2 diabetes mellitus without complications: Secondary | ICD-10-CM

## 2015-07-04 DIAGNOSIS — Z794 Long term (current) use of insulin: Secondary | ICD-10-CM

## 2015-07-04 DIAGNOSIS — E669 Obesity, unspecified: Secondary | ICD-10-CM

## 2015-07-04 DIAGNOSIS — E1100 Type 2 diabetes mellitus with hyperosmolarity without nonketotic hyperglycemic-hyperosmolar coma (NKHHC): Secondary | ICD-10-CM

## 2015-07-04 MED ORDER — PREDNISONE 10 MG PO TABS: ORAL_TABLET | ORAL | Status: DC

## 2015-07-04 MED ORDER — FLUCONAZOLE 150 MG PO TABS: 150.0000 mg | ORAL_TABLET | Freq: Every day | ORAL | Status: DC

## 2015-07-04 MED ORDER — METHYLPREDNISOLONE ACETATE 40 MG/ML IJ SUSP
40.0000 mg | Freq: Once | INTRAMUSCULAR | Status: AC
  Administered 2015-07-04: 40 mg via INTRAMUSCULAR

## 2015-07-04 MED ORDER — INSULIN ASPART PROT & ASPART (70-30 MIX) 100 UNIT/ML ~~LOC~~ SUSP: SUBCUTANEOUS | Status: DC

## 2015-07-04 NOTE — Progress Notes (Signed)
Subjective:  Patient ID: Madeline Strickland, female    DOB: 1970-07-04  Age: 45 y.o. MRN: [id number]  CC: The primary encounter diagnosis was Non-ketotic hyperosmolar coma ([place]). Diagnoses of Uncontrolled type 2 diabetes mellitus with hyperosmolarity without coma, with long-term current use of insulin ([place]), Acute bronchitis, unspecified organism, and Diabetes mellitus type 2 in obese [facility]) were also pertinent to this visit.  HPI Madeline Strickland presents for FOLLOW UP ON   BRONCHITIS AND OTITIS  Treated by Blima Rich on 2/28 with Augmentin and Prednisone taper,  tessalon for bronchitis bilateral otitis with effusions. Feels she did not improve at all,  Although doesn't feel as bad. Right ear hurting "really bad"  Both initially,  Then improved, along with sinus congestion improved as well, but chest still feels heavy.  Coughing occurring only when short of breath.  Too winded to do anything but drive.  Sugars shot up to 600 after one day of prednisone so she stopped the prednisone   Sugars stayed up and are currently 400 despite stopping prednisone on March 3.  Started using lantus and increased dose to 30 units daily .  Lowest sugar in the last week is 280 so she stopped the prednisone again .   Lab Results  Component Value Date   HGBA1C 10.1* 07/04/2015   2) OSA by sleep study December.  Split night ordered,    Work note needed for Wednesday through Wednesday of this week   Outpatient Prescriptions Prior to Visit  Medication Sig Dispense Refill  . ADVAIR DISKUS 250-50 MCG/DOSE AEPB INHALE 1 PUFF INTO THE LUNGS TWICE DAILY 60 each 0  . ALPRAZolam (XANAX) 0.5 MG tablet Take 1 tablet (0.5 mg total) by mouth 2 (two) times daily. As needed for anxiety or insomnia 60 tablet 5  . Alum & Mag Hydroxide-Simeth (MAALOX ADVANCED PO) Take by mouth at bedtime.      Marland Kitchen b complex vitamins tablet Take 1 tablet by mouth daily.      . B-D UF III MINI PEN NEEDLES 31G X 5 MM MISC USE AS DIRECTED THREE TIMES DAILY 100 each 0  .  Blood Glucose Monitoring Suppl (ONE TOUCH ULTRA SYSTEM KIT) W/DEVICE KIT 1 kit by Does not apply route once. 1 each 0  . chlorhexidine gluconate (PERIDEX) 0.12 % solution Use as directed 15 mLs in the mouth or throat 2 (two) times daily. 120 mL 0  . chlorhexidine gluconate (PERIDEX) 0.12 % solution Use as directed 15 mLs in the mouth or throat 2 (two) times daily. 120 mL 0  . famotidine (PEPCID) 20 MG tablet TAKE 1 TABLET(20 MG) BY MOUTH TWICE DAILY 60 tablet 11  . ferrous fumarate (HEMOCYTE - 106 MG FE) 325 (106 FE) MG TABS Take 1 tablet by mouth.      . furosemide (LASIX) 20 MG tablet Take 1 tablet (20 mg total) by mouth daily. As needed for fluid retention 30 tablet 3  . glucose blood (FREESTYLE TEST STRIPS) test strip Use as instructed 100 each 5  . Insulin Glargine (BASAGLAR KWIKPEN) 100 UNIT/ML Solostar Pen Inject 2 Units into the skin daily at 10 pm. 15 mL 11  . Lancets (ONETOUCH ULTRASOFT) lancets Use as instructed 100 each 12  . levothyroxine (SYNTHROID, LEVOTHROID) 175 MCG tablet TAKE 1 TABLET BY MOUTH EVERY DAY 30 tablet 0  . metoprolol succinate (TOPROL-XL) 50 MG 24 hr tablet TAKE 1 TABLET BY MOUTH EVERY DAY 30 tablet 5  . montelukast (SINGULAIR) 10 MG tablet TAKE  1 TABLET BY MOUTH AT BEDTIME 90 tablet 3  . NEXIUM 40 MG capsule TAKE ONE CAPSULE BY MOUTH EVERY DAY 30 capsule 0  . PROVENTIL HFA 108 (90 BASE) MCG/ACT inhaler INHALE 2 PUFFS INTO THE LUNGS EVERY 6 HOURS AS NEEDED FOR WHEEZING OR SHORTNESS OF BREATH 6.7 g 2  . spironolactone (ALDACTONE) 100 MG tablet TAKE 1 TABLET BY MOUTH TWICE DAILY 180 tablet 0  . Syringe/Needle, Disp, (SYRINGE 3CC/25GX1") 25G X 1" 3 ML MISC Use as directed 15 each 0  . mometasone (ELOCON) 0.1 % cream Apply 1 application topically daily. In ears as needed 45 g 0  . albuterol (ACCUNEB) 1.25 MG/3ML nebulizer solution Take 3 mLs (1.25 mg total) by nebulization every 6 (six) hours as needed for wheezing. 75 mL 12  . amoxicillin-clavulanate (AUGMENTIN) 875-125  MG tablet Take 1 tablet by mouth 2 (two) times daily. (Patient not taking: Reported on 07/04/2015) 20 tablet 0  . benzonatate (TESSALON) 200 MG capsule Take 1 capsule (200 mg total) by mouth 2 (two) times daily as needed for cough. (Patient not taking: Reported on 07/04/2015) 20 capsule 0  . Lactobacillus-Inulin ([place]) CAPS Take 1 capsule by mouth daily. (Patient not taking: Reported on 07/04/2015) 14 capsule 0  . predniSONE (DELTASONE) 10 MG tablet Take 35m by mouth on day 1, then taper by 135mdaily until gone (Patient not taking: Reported on 07/04/2015) 21 tablet 0  . Cyanocobalamin 1000 MCG SUBL Place 1 tablet (1,000 mcg total) under the tongue daily. 90 tablet 3  . levothyroxine (SYNTHROID, LEVOTHROID) 175 MCG tablet TAKE 1 TABLET BY MOUTH EVERY DAY 30 tablet 0  . metoprolol succinate (TOPROL-XL) 50 MG 24 hr tablet TAKE 1 TABLET BY MOUTH EVERY DAY 30 tablet 5  . metoprolol succinate (TOPROL-XL) 50 MG 24 hr tablet TAKE 1 TABLET BY MOUTH EVERY DAY 30 tablet 0  . mupirocin ointment (BACTROBAN) 2 % Apply 1 application topically 2 (two) times daily. On affected ear 22 g 0  . nystatin (MYCOSTATIN) powder Apply topically 2 (two) times daily. 15 g 3  . spironolactone (ALDACTONE) 100 MG tablet TAKE 1 TABLET BY MOUTH TWICE DAILY 180 tablet 3   No facility-administered medications prior to visit.    Review of Systems;  Patient denies headache, fevers, malaise, unintentional weight loss, skin rash, eye pain, sinus congestion and sinus pain, sore throat, dysphagia,  hemoptysis , cough, dyspnea, wheezing, chest pain, palpitations, orthopnea, edema, abdominal pain, nausea, melena, diarrhea, constipation, flank pain, dysuria, hematuria, urinary  Frequency, nocturia, numbness, tingling, seizures,  Focal weakness, Loss of consciousness,  Tremor, insomnia, depression, anxiety, and suicidal ideation.      Objective:  BP 124/88 mmHg  Pulse 98  Temp(Src) 98.2 F (36.8 C) (Oral)  Resp 12   Ht 5' 8"  (1.727 m)  Wt 239 lb 8 oz (108.636 kg)  BMI 36.42 kg/m2  SpO2 98%  BP Readings from Last 3 Encounters:  07/04/15 124/88  06/21/15 111/71  04/08/15 112/68    Wt Readings from Last 3 Encounters:  07/04/15 239 lb 8 oz (108.636 kg)  06/21/15 245 lb (111.131 kg)  04/08/15 250 lb (113.399 kg)    General appearance: alert, cooperative and appears stated age Ears: normal TM's and external ear canals both ears Throat: lips, mucosa, and tongue normal; teeth and gums normal Neck: no adenopathy, no carotid bruit, supple, symmetrical, trachea midline and thyroid not enlarged, symmetric, no tenderness/mass/nodules Back: symmetric, no curvature. ROM normal. No CVA tenderness. Lungs: clear to  auscultation bilaterally Heart: regular rate and rhythm, S1, S2 normal, no murmur, click, rub or gallop Abdomen: soft, non-tender; bowel sounds normal; no masses,  no organomegaly Pulses: 2+ and symmetric Skin: Skin color, texture, turgor normal. No rashes or lesions Lymph nodes: Cervical, supraclavicular, and axillary nodes normal.  Lab Results  Component Value Date   HGBA1C 10.1* 07/04/2015   HGBA1C 7.0* 03/01/2015   HGBA1C 6.4* 10/22/2014    Lab Results  Component Value Date   CREATININE 1.18 07/04/2015   CREATININE 1.03 03/01/2015   CREATININE 1.01 10/22/2014    Lab Results  Component Value Date   WBC 16.6* 07/04/2015   HGB 12.7 07/04/2015   HCT 38.9 07/04/2015   PLT 351.0 07/04/2015   GLUCOSE 291* 07/04/2015   CHOL 112 03/01/2015   TRIG 98.0 03/01/2015   HDL 33.20* 03/01/2015   LDLDIRECT 69.0 03/01/2015   LDLCALC 59 03/01/2015   ALT 40* 07/04/2015   AST 18 07/04/2015   NA 137 07/04/2015   K 4.4 07/04/2015   CL 102 07/04/2015   CREATININE 1.18 07/04/2015   BUN 17 07/04/2015   CO2 25 07/04/2015   TSH 1.97 03/01/2015   HGBA1C 10.1* 07/04/2015   MICROALBUR 0.9 03/01/2015    No results found.  Assessment & Plan:   Problem List Items Addressed This Visit     Diabetes mellitus type 2 in obese ([place])    Starting 70/30 insulin bid for prednisone induced hyperglycemia       Acute bronchitis    Repeating prednisone taper for persistent symptoms       Relevant Medications   methylPREDNISolone acetate (DEPO-MEDROL) injection 40 mg (Completed)    Other Visit Diagnoses    Non-ketotic hyperosmolar coma ([place])    -  Primary    Uncontrolled type 2 diabetes mellitus with hyperosmolarity without coma, with long-term current use of insulin ([facility])        Relevant Orders    Comprehensive metabolic panel (Completed)    CBC with Differential/Platelet (Completed)    Magnesium (Completed)    Urinalysis, Routine w reflex microscopic (Completed)    Hemoglobin A1c (Completed)       I have discontinued Ms. Koons nystatin, mupirocin ointment, mometasone, and Cyanocobalamin. I am also having her start on fluconazole and predniSONE. Additionally, I am having her maintain her Alum & Mag Hydroxide-Simeth (MAALOX ADVANCED PO), b complex vitamins, ferrous fumarate, albuterol, metoprolol succinate, glucose blood, ONE TOUCH ULTRA SYSTEM KIT, onetouch ultrasoft, CULTURELLE DIGESTIVE HEALTH, furosemide, NEXIUM, SYRINGE 3CC/25GX1", B-D UF III MINI PEN NEEDLES, levothyroxine, famotidine, PROVENTIL HFA, spironolactone, ADVAIR DISKUS, ALPRAZolam, chlorhexidine gluconate, chlorhexidine gluconate, montelukast, Insulin Glargine, predniSONE, amoxicillin-clavulanate, and benzonatate. We administered methylPREDNISolone acetate.  Meds ordered this encounter  Medications  . fluconazole (DIFLUCAN) 150 MG tablet    Sig: Take 1 tablet (150 mg total) by mouth daily.    Dispense:  2 tablet    Refill:  1  . DISCONTD: insulin aspart protamine- aspart (NOVOLOG MIX 70/30) (70-30) 100 UNIT/ML injection    Sig: 15 units in the morning and 25 units in the evening before meals    Dispense:  10 mL    Refill:  11    Increase every 3 days as needed for BS > 200  . predniSONE (DELTASONE) 10 MG tablet      Sig: 6 tablets on Day 1 , then reduce by 1 tablet daily until gone    Dispense:  21 tablet    Refill:  0  . methylPREDNISolone acetate (DEPO-MEDROL)  injection 40 mg    Sig:     Medications Discontinued During This Encounter  Medication Reason  . Cyanocobalamin 4840 MCG SUBL Duplicate  . levothyroxine (SYNTHROID, LEVOTHROID) 397 MCG tablet Duplicate  . metoprolol succinate (TOPROL-XL) 50 MG 24 hr tablet Error  . metoprolol succinate (TOPROL-XL) 50 MG 24 hr tablet Error  . mometasone (ELOCON) 0.1 % cream Error  . mupirocin ointment (BACTROBAN) 2 % Error  . nystatin (MYCOSTATIN) powder Completed Course  . spironolactone (ALDACTONE) 953 MG tablet Duplicate    Follow-up: No Follow-up on file.   Crecencio Mc, MD

## 2015-07-04 NOTE — Patient Instructions (Signed)
I am repeating your prednisone taper if you can tolerate it  I am starting you on 70/30 insulin to use twice daily until your sugars are < 150  Start with 15 units before breakfast and 25 units before  Dinner  Increase by 3 units daily with each dose if your sugars are > 250    Continue lantus at 20 units   daily for now   Try flushing your sinuses daily with [facility]'s sinus rinse and adding Sudafed Pe 10 mg every 6 hours for congestion

## 2015-07-04 NOTE — Progress Notes (Signed)
Pre-visit discussion using our clinic review tool. No additional management support is needed unless otherwise documented below in the visit note.  

## 2015-07-05 ENCOUNTER — Other Ambulatory Visit: Payer: BC Managed Care – PPO

## 2015-07-05 ENCOUNTER — Other Ambulatory Visit: Payer: Self-pay

## 2015-07-05 LAB — CBC WITH DIFFERENTIAL/PLATELET
BASOS ABS: 0 10*3/uL (ref 0.0–0.1)
Basophils Relative: 0.2 % (ref 0.0–3.0)
EOS PCT: 0.8 % (ref 0.0–5.0)
Eosinophils Absolute: 0.1 10*3/uL (ref 0.0–0.7)
HCT: 38.9 % (ref 36.0–46.0)
HEMOGLOBIN: 12.7 g/dL (ref 12.0–15.0)
Lymphocytes Relative: 14.2 % (ref 12.0–46.0)
Lymphs Abs: 2.3 10*3/uL (ref 0.7–4.0)
MCHC: 32.7 g/dL (ref 30.0–36.0)
MCV: 88.1 fl (ref 78.0–100.0)
MONOS PCT: 6 % (ref 3.0–12.0)
Monocytes Absolute: 1 10*3/uL (ref 0.1–1.0)
Neutro Abs: 13.1 10*3/uL — ABNORMAL HIGH (ref 1.4–7.7)
Neutrophils Relative %: 78.8 % — ABNORMAL HIGH (ref 43.0–77.0)
Platelets: 351 10*3/uL (ref 150.0–400.0)
RBC: 4.42 Mil/uL (ref 3.87–5.11)
RDW: 14.4 % (ref 11.5–15.5)
WBC: 16.6 10*3/uL — AB (ref 4.0–10.5)

## 2015-07-05 LAB — HEMOGLOBIN A1C: Hgb A1c MFr Bld: 10.1 % — ABNORMAL HIGH (ref 4.6–6.5)

## 2015-07-05 LAB — COMPREHENSIVE METABOLIC PANEL
ALBUMIN: 4.3 g/dL (ref 3.5–5.2)
ALK PHOS: 117 U/L (ref 39–117)
ALT: 40 U/L — ABNORMAL HIGH (ref 0–35)
AST: 18 U/L (ref 0–37)
BILIRUBIN TOTAL: 0.5 mg/dL (ref 0.2–1.2)
BUN: 17 mg/dL (ref 6–23)
CALCIUM: 9.4 mg/dL (ref 8.4–10.5)
CO2: 25 mEq/L (ref 19–32)
Chloride: 102 mEq/L (ref 96–112)
Creatinine, Ser: 1.18 mg/dL (ref 0.40–1.20)
GFR: 63.25 mL/min (ref >60.00)
Glucose, Bld: 291 mg/dL — ABNORMAL HIGH (ref 70–99)
POTASSIUM: 4.4 meq/L (ref 3.5–5.1)
Sodium: 137 mEq/L (ref 135–145)
TOTAL PROTEIN: 7.2 g/dL (ref 6.0–8.3)

## 2015-07-05 LAB — MAGNESIUM: MAGNESIUM: 2 mg/dL (ref 1.5–2.5)

## 2015-07-05 NOTE — Telephone Encounter (Signed)
The pharmacy called and stated that the pt is requesing the novolog pen instead of the vial. Please advise okay to fill, thanks

## 2015-07-06 ENCOUNTER — Encounter: Payer: Self-pay | Admitting: Internal Medicine

## 2015-07-06 LAB — URINALYSIS, ROUTINE W REFLEX MICROSCOPIC
Bilirubin Urine: NEGATIVE
LEUKOCYTES UA: NEGATIVE
Nitrite: NEGATIVE
Specific Gravity, Urine: 1.025 (ref 1.000–1.030)
TOTAL PROTEIN, URINE-UPE24: NEGATIVE
UROBILINOGEN UA: 0.2 (ref 0.0–1.0)
pH: 5.5 (ref 5.0–8.0)

## 2015-07-06 MED ORDER — INSULIN ASPART PROT & ASPART (70-30 MIX) 100 UNIT/ML PEN: PEN_INJECTOR | SUBCUTANEOUS | Status: DC

## 2015-07-06 NOTE — Telephone Encounter (Signed)
YES, BUT BE CAREFUL.  YOU PUT IN THE WRONG DOSE.

## 2015-07-06 NOTE — Telephone Encounter (Signed)
noted 

## 2015-07-06 NOTE — Assessment & Plan Note (Signed)
Repeating prednisone taper for persistent symptoms

## 2015-07-06 NOTE — Assessment & Plan Note (Signed)
Starting 70/30 insulin bid for prednisone induced hyperglycemia

## 2015-07-12 ENCOUNTER — Other Ambulatory Visit: Payer: Self-pay | Admitting: Internal Medicine

## 2015-07-12 MED ORDER — GLUCOSE BLOOD VI STRP: ORAL_STRIP | Status: DC

## 2015-07-19 ENCOUNTER — Ambulatory Visit (INDEPENDENT_AMBULATORY_CARE_PROVIDER_SITE_OTHER): Payer: BC Managed Care – PPO | Admitting: Internal Medicine

## 2015-07-19 ENCOUNTER — Encounter: Payer: Self-pay | Admitting: Internal Medicine

## 2015-07-19 VITALS — BP 104/66 | HR 70 | Temp 98.5°F | Resp 14 | Ht 68.0 in | Wt 241.0 lb

## 2015-07-19 DIAGNOSIS — F411 Generalized anxiety disorder: Secondary | ICD-10-CM

## 2015-07-19 DIAGNOSIS — E1100 Type 2 diabetes mellitus with hyperosmolarity without nonketotic hyperglycemic-hyperosmolar coma (NKHHC): Secondary | ICD-10-CM | POA: Diagnosis not present

## 2015-07-19 DIAGNOSIS — Z794 Long term (current) use of insulin: Secondary | ICD-10-CM | POA: Diagnosis not present

## 2015-07-19 MED ORDER — ALPRAZOLAM ER 0.5 MG PO TB24: 0.5000 mg | ORAL_TABLET | Freq: Every day | ORAL | Status: DC

## 2015-07-19 NOTE — Patient Instructions (Addendum)
Increase your evening  dose  Of 70/30 insulin to  30 units  Increase morning dose  to 18 units  And   STOP SKIPPING BREAKFAST!  It will be impossible to lose weigh toand get yoru sugars under control if you fast until 10 am  Madeline MeckelJimmy Strickland [name] Hard boiled eggs [name]  ALL ARE EASY!  THINK OF THEM AS FUEL     For your anxiety:   I am prescribing Alprazolam, ER to use once daily in the morning .  You can continue alprazolam short acting at night as needed for insomnia

## 2015-07-19 NOTE — Progress Notes (Signed)
Subjective:  Patient ID: Madeline Strickland, female  DOB: [birth year]  Age: 45 y.o. MRN: [id number]  CC: The primary encounter diagnosis was Uncontrolled type 2 diabetes mellitus with hyperosmolarity without coma, with long-term current use of insulin ([place]). A diagnosis of Anxiety state was also pertinent to this visit.  HPI NASHAY Strickland presents for follow up on type 2 DM,  uncontrolled.  She was Seen 2 weeks ago and 70/30 insulin started for prednisone induced hyperglycemia. Blood sugars reviewed.  fastings are averaging 186  . evenigns 300  7 day average 242   Eats bfast at 10 am but gets up at 4 am .   She is taking 15 units in the morning and 25 in the evening.    Lab Results  Component Value Date   HGBA1C 10.1* 07/04/2015    2) Anxiety is out of control,  In the midst of prolonged estrangement.  Husband is pastor and afraid to have a divorce.   47 yr old daughter has manifested itself with obsessive compulsive behavior.  Has trouble in large groups which often happens at work.   Feels she needs to have FMLA or something to allow her to have a "mental health day once or two per week "   3) Obesity: diet and exercise reviewed..  Pharmacologics reviewed. Along with risks and benefits.   Outpatient Prescriptions Prior to Visit  Medication Sig Dispense Refill  . ADVAIR DISKUS 250-50 MCG/DOSE AEPB INHALE 1 PUFF INTO THE LUNGS TWICE DAILY 60 each 0  . ALPRAZolam (XANAX) 0.5 MG tablet Take 1 tablet (0.5 mg total) by mouth 2 (two) times daily. As needed for anxiety or insomnia 60 tablet 5  . Alum & Mag Hydroxide-Simeth (MAALOX ADVANCED PO) Take by mouth at bedtime.      Marland Kitchen b complex vitamins tablet Take 1 tablet by mouth daily.      . B-D UF III MINI PEN NEEDLES 31G X 5 MM MISC USE AS DIRECTED THREE TIMES DAILY 100 each 0  . Blood Glucose Monitoring Suppl (ONE TOUCH ULTRA SYSTEM KIT) W/DEVICE KIT 1 kit by Does not apply route once. 1 each 0  . chlorhexidine gluconate (PERIDEX) 0.12 % solution Use as  directed 15 mLs in the mouth or throat 2 (two) times daily. 120 mL 0  . famotidine (PEPCID) 20 MG tablet TAKE 1 TABLET(20 MG) BY MOUTH TWICE DAILY 60 tablet 11  . ferrous fumarate (HEMOCYTE - 106 MG FE) 325 (106 FE) MG TABS Take 1 tablet by mouth.      . furosemide (LASIX) 20 MG tablet Take 1 tablet (20 mg total) by mouth daily. As needed for fluid retention 30 tablet 3  . glucose blood (FREESTYLE TEST STRIPS) test strip Use to check sugar before meals. 100 each 5  . insulin aspart protamine - aspart (NOVOLOG 70/30 MIX) (70-30) 100 UNIT/ML FlexPen 15 UNITS IN THE AM AND 25 UNITS  IN THE PM BEFORE MEALS 15 mL 3  . Insulin Glargine (BASAGLAR KWIKPEN) 100 UNIT/ML Solostar Pen Inject 2 Units into the skin daily at 10 pm. 15 mL 11  . Lactobacillus-Inulin ([place]) CAPS Take 1 capsule by mouth daily. 14 capsule 0  . Lancets (ONETOUCH ULTRASOFT) lancets Use as instructed 100 each 12  . levothyroxine (SYNTHROID, LEVOTHROID) 175 MCG tablet TAKE 1 TABLET BY MOUTH EVERY DAY 30 tablet 0  . metoprolol succinate (TOPROL-XL) 50 MG 24 hr tablet TAKE 1 TABLET BY MOUTH EVERY DAY 30 tablet 5  .  montelukast (SINGULAIR) 10 MG tablet TAKE 1 TABLET BY MOUTH AT BEDTIME 90 tablet 3  . PROVENTIL HFA 108 (90 BASE) MCG/ACT inhaler INHALE 2 PUFFS INTO THE LUNGS EVERY 6 HOURS AS NEEDED FOR WHEEZING OR SHORTNESS OF BREATH 6.7 g 2  . spironolactone (ALDACTONE) 100 MG tablet TAKE 1 TABLET BY MOUTH TWICE DAILY 180 tablet 0  . Syringe/Needle, Disp, (SYRINGE 3CC/25GX1") 25G X 1" 3 ML MISC Use as directed 15 each 0  . albuterol (ACCUNEB) 1.25 MG/3ML nebulizer solution Take 3 mLs (1.25 mg total) by nebulization every 6 (six) hours as needed for wheezing. 75 mL 12  . fluconazole (DIFLUCAN) 150 MG tablet Take 1 tablet (150 mg total) by mouth daily. (Patient not taking: Reported on 07/19/2015) 2 tablet 1  . amoxicillin-clavulanate (AUGMENTIN) 875-125 MG tablet Take 1 tablet by mouth 2 (two) times daily. (Patient not  taking: Reported on 07/04/2015) 20 tablet 0  . benzonatate (TESSALON) 200 MG capsule Take 1 capsule (200 mg total) by mouth 2 (two) times daily as needed for cough. (Patient not taking: Reported on 07/04/2015) 20 capsule 0  . chlorhexidine gluconate (PERIDEX) 0.12 % solution Use as directed 15 mLs in the mouth or throat 2 (two) times daily. 120 mL 0  . NEXIUM 40 MG capsule TAKE ONE CAPSULE BY MOUTH EVERY DAY 30 capsule 0  . predniSONE (DELTASONE) 10 MG tablet Take 35m by mouth on day 1, then taper by 131mdaily until gone (Patient not taking: Reported on 07/04/2015) 21 tablet 0  . predniSONE (DELTASONE) 10 MG tablet 6 tablets on Day 1 , then reduce by 1 tablet daily until gone 21 tablet 0   No facility-administered medications prior to visit.    Review of Systems;  Patient denies headache, fevers, malaise, unintentional weight loss, skin rash, eye pain, sinus congestion and sinus pain, sore throat, dysphagia,  hemoptysis , cough, dyspnea, wheezing, chest pain, palpitations, orthopnea, edema, abdominal pain, nausea, melena, diarrhea, constipation, flank pain, dysuria, hematuria, urinary  Frequency, nocturia, numbness, tingling, seizures,  Focal weakness, Loss of consciousness,  Tremor, insomnia, depression, anxiety, and suicidal ideation.      Objective:  BP 104/66 mmHg  Pulse 70  Temp(Src) 98.5 F (36.9 C) (Oral)  Resp 14  Ht 5' 8"  (1.727 m)  Wt 241 lb (109.317 kg)  BMI 36.65 kg/m2  SpO2 98%  LMP 06/23/2015 (Approximate)  BP Readings from Last 3 Encounters:  07/19/15 104/66  07/04/15 124/88  06/21/15 111/71    Wt Readings from Last 3 Encounters:  07/19/15 241 lb (109.317 kg)  07/04/15 239 lb 8 oz (108.636 kg)  06/21/15 245 lb (111.131 kg)    General appearance: alert, cooperative and appears stated age Ears: normal TM's and external ear canals both ears Throat: lips, mucosa, and tongue normal; teeth and gums normal Neck: no adenopathy, no carotid bruit, supple, symmetrical,  trachea midline and thyroid not enlarged, symmetric, no tenderness/mass/nodules Back: symmetric, no curvature. ROM normal. No CVA tenderness. Lungs: clear to auscultation bilaterally Heart: regular rate and rhythm, S1, S2 normal, no murmur, click, rub or gallop Abdomen: soft, non-tender; bowel sounds normal; no masses,  no organomegaly Pulses: 2+ and symmetric Skin: Skin color, texture, turgor normal. No rashes or lesions Lymph nodes: Cervical, supraclavicular, and axillary nodes normal. Psych: affect normal, makes good eye contact. No fidgeting,  Smiles easily.  Denies suicidal thoughts   Lab Results  Component Value Date   HGBA1C 10.1* 07/04/2015   HGBA1C 7.0* 03/01/2015   HGBA1C 6.4* 10/22/2014  Lab Results  Component Value Date   CREATININE 1.18 07/04/2015   CREATININE 1.03 03/01/2015   CREATININE 1.01 10/22/2014    Lab Results  Component Value Date   WBC 16.6* 07/04/2015   HGB 12.7 07/04/2015   HCT 38.9 07/04/2015   PLT 351.0 07/04/2015   GLUCOSE 291* 07/04/2015   CHOL 112 03/01/2015   TRIG 98.0 03/01/2015   HDL 33.20* 03/01/2015   LDLDIRECT 69.0 03/01/2015   LDLCALC 59 03/01/2015   ALT 40* 07/04/2015   AST 18 07/04/2015   NA 137 07/04/2015   K 4.4 07/04/2015   CL 102 07/04/2015   CREATININE 1.18 07/04/2015   BUN 17 07/04/2015   CO2 25 07/04/2015   TSH 1.97 03/01/2015   HGBA1C 10.1* 07/04/2015   MICROALBUR 0.9 03/01/2015    No results found.  Assessment & Plan:   Problem List Items Addressed This Visit    Type II diabetes mellitus, uncontrolled ([place]) - Primary    Does of 70/30 were increased to 18 units and 30 units.  Diet reviewed and habits dicussed. Advised to stop skipping breakfast. I have addressed  BMI and recommended wt loss of 10% of body weigh over the next 6 months using a low glycemic index diet and regular exercise a minimum of 5 days per week.        Anxiety state    Aggravated by prolonged in house estrangement from husband without  resolution . Her anxiety is affecting her work due to her intolerance of large groups when she is "stressed out."  Trial of Alprazolam ER       Relevant Medications   ALPRAZolam (XANAX XR) 0.5 MG 24 hr tablet      I have discontinued Ms. Hottenstein NEXIUM, predniSONE, amoxicillin-clavulanate, benzonatate, and predniSONE. I am also having her start on ALPRAZolam. Additionally, I am having her maintain her Alum & Mag Hydroxide-Simeth (MAALOX ADVANCED PO), b complex vitamins, ferrous fumarate, albuterol, metoprolol succinate, ONE TOUCH ULTRA SYSTEM KIT, onetouch ultrasoft, CULTURELLE DIGESTIVE HEALTH, furosemide, SYRINGE 3CC/25GX1", B-D UF III MINI PEN NEEDLES, levothyroxine, famotidine, PROVENTIL HFA, spironolactone, ADVAIR DISKUS, ALPRAZolam, chlorhexidine gluconate ([place] KIT), montelukast, Insulin Glargine, fluconazole, insulin aspart protamine - aspart, and glucose blood.  Meds ordered this encounter  Medications  . ALPRAZolam (XANAX XR) 0.5 MG 24 hr tablet    Sig: Take 1 tablet (0.5 mg total) by mouth daily.    Dispense:  30 tablet    Refill:  2    Medications Discontinued During This Encounter  Medication Reason  . amoxicillin-clavulanate (AUGMENTIN) 875-125 MG tablet Completed Course  . benzonatate (TESSALON) 200 MG capsule Completed Course  . chlorhexidine gluconate (PERIDEX) 2.13 % solution Duplicate  . NEXIUM 40 MG capsule Error  . predniSONE (DELTASONE) 10 MG tablet Error  . predniSONE (DELTASONE) 10 MG tablet Error    Follow-up: Return in about 3 months (around 10/19/2015) for follow up diabetes.   Crecencio Mc, MD

## 2015-07-19 NOTE — Progress Notes (Signed)
Pre-visit discussion using our clinic review tool. No additional management support is needed unless otherwise documented below in the visit note.  

## 2015-07-20 DIAGNOSIS — F411 Generalized anxiety disorder: Secondary | ICD-10-CM | POA: Insufficient documentation

## 2015-07-20 NOTE — Assessment & Plan Note (Addendum)
Does of 70/30 were increased to 18 units and 30 units.  Diet reviewed and habits dicussed. Advised to stop skipping breakfast. I have addressed  BMI and recommended wt loss of 10% of body weigh over the next 6 months using a low glycemic index diet and regular exercise a minimum of 5 days per week.

## 2015-07-20 NOTE — Assessment & Plan Note (Signed)
Aggravated by prolonged in house estrangement from husband without resolution . Her anxiety is affecting her work due to her intolerance of large groups when she is "stressed out."  Trial of Alprazolam ER

## 2015-07-29 ENCOUNTER — Other Ambulatory Visit: Payer: Self-pay | Admitting: Internal Medicine

## 2015-07-29 ENCOUNTER — Encounter: Payer: Self-pay | Admitting: Internal Medicine

## 2015-07-29 MED ORDER — PHENTERMINE HCL 37.5 MG PO TABS: ORAL_TABLET | ORAL | Status: DC

## 2015-07-31 ENCOUNTER — Other Ambulatory Visit: Payer: Self-pay | Admitting: Internal Medicine

## 2015-08-02 ENCOUNTER — Telehealth: Payer: Self-pay | Admitting: Internal Medicine

## 2015-08-02 NOTE — Telephone Encounter (Signed)
PA started on cover my meds for patient phentermine. FYI

## 2015-08-19 ENCOUNTER — Encounter: Payer: Self-pay | Admitting: Internal Medicine

## 2015-08-23 LAB — HM DIABETES EYE EXAM

## 2015-09-05 ENCOUNTER — Encounter: Payer: Self-pay | Admitting: Internal Medicine

## 2015-09-05 ENCOUNTER — Telehealth: Payer: BC Managed Care – PPO | Admitting: Family

## 2015-09-05 DIAGNOSIS — B372 Candidiasis of skin and nail: Secondary | ICD-10-CM

## 2015-09-05 MED ORDER — NYSTATIN 100000 UNIT/GM EX POWD: Freq: Two times a day (BID) | CUTANEOUS | Status: DC

## 2015-09-05 MED ORDER — NYSTATIN-TRIAMCINOLONE 100000-0.1 UNIT/GM-% EX OINT: 1.0000 "application " | TOPICAL_OINTMENT | Freq: Two times a day (BID) | CUTANEOUS | Status: DC

## 2015-09-05 NOTE — Progress Notes (Signed)
E Visit for Rash  We are sorry that you are not feeling well. Here is how we plan to help!  It looks like you have rash from yeast. I have sent you in a prescription of nystatin-triamcinolone ointment to use twice a day. Continue to use the nystatin powder. Keep the area clean and dry.   HOME CARE:   Take cool showers and avoid direct sunlight.  Apply cool compress or wet dressings.  Take a bath in an oatmeal bath.  Sprinkle content of one Aveeno packet under running faucet with comfortably warm water.  Bathe for 15-20 minutes, 1-2 times daily.  Pat dry with a towel. Do not rub the rash.  Use hydrocortisone cream.  Take an antihistamine like Benadryl for widespread rashes that itch.  The adult dose of Benadryl is 25-50 mg by mouth 4 times daily.  Caution:  This type of medication may cause sleepiness.  Do not drink alcohol, drive, or operate dangerous machinery while taking antihistamines.  Do not take these medications if you have prostate enlargement.  Read package instructions thoroughly on all medications that you take.  GET HELP RIGHT AWAY IF:   Symptoms don't go away after treatment.  Severe itching that persists.  If you rash spreads or swells.  If you rash begins to smell.  If it blisters and opens or develops a yellow-brown crust.  You develop a fever.  You have a sore throat.  You become short of breath.  MAKE SURE YOU:  Understand these instructions. Will watch your condition. Will get help right away if you are not doing well or get worse.  Thank you for choosing an e-visit. Your e-visit answers were reviewed by a board certified advanced clinical practitioner to complete your personal care plan. Depending upon the condition, your plan could have included both over the counter or prescription medications. Please review your pharmacy choice. Be sure that the pharmacy you have chosen is open so that you can pick up your prescription now.  If there is a  problem you may message your provider in MyChart to have the prescription routed to another pharmacy. Your safety is important to us. If you have drug allergies check your prescription carefully.  For the next 24 hours, you can use MyChart to ask questions about today's visit, request a non-urgent call back, or ask for a work or school excuse from your e-visit provider. You will get an email in the next two days asking about your experience. I hope that your e-visit has been valuable and will speed your recovery.

## 2015-09-06 NOTE — Telephone Encounter (Signed)
Ok to refill powder at larger size or multiple?

## 2015-09-08 ENCOUNTER — Encounter: Payer: Self-pay | Admitting: Internal Medicine

## 2015-09-11 ENCOUNTER — Other Ambulatory Visit: Payer: Self-pay | Admitting: Internal Medicine

## 2015-09-23 ENCOUNTER — Other Ambulatory Visit: Payer: Self-pay | Admitting: Internal Medicine

## 2015-10-07 ENCOUNTER — Other Ambulatory Visit: Payer: Self-pay | Admitting: Internal Medicine

## 2015-10-07 ENCOUNTER — Other Ambulatory Visit: Payer: Self-pay | Admitting: Family

## 2015-10-19 ENCOUNTER — Encounter: Payer: Self-pay | Admitting: Internal Medicine

## 2015-10-19 ENCOUNTER — Ambulatory Visit (INDEPENDENT_AMBULATORY_CARE_PROVIDER_SITE_OTHER): Payer: BC Managed Care – PPO | Admitting: Internal Medicine

## 2015-10-19 VITALS — BP 118/84 | HR 89 | Temp 98.7°F | Resp 12 | Ht 68.0 in | Wt 237.8 lb

## 2015-10-19 DIAGNOSIS — E1169 Type 2 diabetes mellitus with other specified complication: Secondary | ICD-10-CM

## 2015-10-19 DIAGNOSIS — E1159 Type 2 diabetes mellitus with other circulatory complications: Secondary | ICD-10-CM

## 2015-10-19 DIAGNOSIS — E038 Other specified hypothyroidism: Secondary | ICD-10-CM | POA: Diagnosis not present

## 2015-10-19 DIAGNOSIS — E785 Hyperlipidemia, unspecified: Secondary | ICD-10-CM

## 2015-10-19 DIAGNOSIS — E559 Vitamin D deficiency, unspecified: Secondary | ICD-10-CM

## 2015-10-19 DIAGNOSIS — E034 Atrophy of thyroid (acquired): Secondary | ICD-10-CM

## 2015-10-19 DIAGNOSIS — E119 Type 2 diabetes mellitus without complications: Secondary | ICD-10-CM | POA: Diagnosis not present

## 2015-10-19 DIAGNOSIS — I10 Essential (primary) hypertension: Secondary | ICD-10-CM

## 2015-10-19 DIAGNOSIS — E669 Obesity, unspecified: Secondary | ICD-10-CM

## 2015-10-19 MED ORDER — PHENTERMINE HCL 37.5 MG PO TABS: ORAL_TABLET | ORAL | Status: DC

## 2015-10-19 NOTE — Patient Instructions (Signed)
   I have authorized the use of phentermine for 3 more months.   Take 1/2 tablet in the morning,  The second half by 2 PM to avoid insomnia. If you have not lost 11 lbs  by the end of the  3 months we will have to discontinue it.   You HAVE to eat regularly and exercise for 3 0 minutes a minium of 3 days per week

## 2015-10-19 NOTE — Progress Notes (Signed)
Pre-visit discussion using our clinic review tool. No additional management support is needed unless otherwise documented below in the visit note.  

## 2015-10-19 NOTE — Progress Notes (Signed)
Subjective:  Patient ID: Madeline Strickland, female    DOB: Mar 28, 1971  Age: 45 y.o. MRN: [id number]  CC: The primary encounter diagnosis was Hypothyroidism due to acquired atrophy of thyroid. Diagnoses of Obesity, diabetes, and hypertension syndrome ([place]), Vitamin D deficiency, Hyperlipidemia, and Obesity (BMI 30-39.9) were also pertinent to this visit.  HPI Madeline Strickland presents for   follow up on uncontrolled  type 2 DM ,  and obesity  Has being having increased emotional stressors due to ongoing unresolved marital discord , separation put on hold due to 97 yr old daughter's episode of being bullied by racist whites at [employer].  Patinent's husband , who is a Company secretary, is now home schooling daughter.  Lab Results  Component Value Date   HGBA1C 10.1* 07/04/2015   30 day average 189.  Not eating more than once or twice daily and admits to eating poorly .  Took adipex 1/2 tablet daily for the first month,  Then stopped it for a months.   For the last month has been taking full tablet daily .  Has lost only net 4 lbs, but states that by her scaels she has lost 8 lbs in the last 30 days.  .   Not exercising .  Keeps carbs under 50 daily .  Not  eating regularly  Has not been consistent with insulin use 18 units in the am and 30 units in the evening     Outpatient Prescriptions Prior to Visit  Medication Sig Dispense Refill  . ADVAIR DISKUS 250-50 MCG/DOSE AEPB INHALE 1 PUFF INTO THE LUNGS TWICE DAILY 60 each 0  . ALPRAZolam (XANAX XR) 0.5 MG 24 hr tablet Take 1 tablet (0.5 mg total) by mouth daily. 30 tablet 2  . ALPRAZolam (XANAX) 0.5 MG tablet Take 1 tablet (0.5 mg total) by mouth 2 (two) times daily. As needed for anxiety or insomnia 60 tablet 5  . Alum & Mag Hydroxide-Simeth (MAALOX ADVANCED PO) Take by mouth at bedtime.      Marland Kitchen b complex vitamins tablet Take 1 tablet by mouth daily.      . B-D UF III MINI PEN NEEDLES 31G X 5 MM MISC USE AS DIRECTED THREE TIMES DAILY 100 each 0    . B-D UF III MINI PEN NEEDLES 31G X 5 MM MISC USE AS DIRECTED THREE TIMES DAILY 100 each 0  . Blood Glucose Monitoring Suppl (ONE TOUCH ULTRA SYSTEM KIT) W/DEVICE KIT 1 kit by Does not apply route once. 1 each 0  . chlorhexidine gluconate (PERIDEX) 0.12 % solution Use as directed 15 mLs in the mouth or throat 2 (two) times daily. 120 mL 0  . famotidine (PEPCID) 20 MG tablet TAKE 1 TABLET(20 MG) BY MOUTH TWICE DAILY 60 tablet 11  . ferrous fumarate (HEMOCYTE - 106 MG FE) 325 (106 FE) MG TABS Take 1 tablet by mouth.      . fluconazole (DIFLUCAN) 150 MG tablet Take 1 tablet (150 mg total) by mouth daily. 2 tablet 1  . furosemide (LASIX) 20 MG tablet Take 1 tablet (20 mg total) by mouth daily. As needed for fluid retention 30 tablet 3  . glucose blood (FREESTYLE TEST STRIPS) test strip Use to check sugar before meals. 100 each 5  . insulin aspart protamine - aspart (NOVOLOG 70/30 MIX) (70-30) 100 UNIT/ML FlexPen 15 UNITS IN THE AM AND 25 UNITS  IN THE PM BEFORE MEALS 15 mL 3  . Insulin Glargine (BASAGLAR KWIKPEN) 100  UNIT/ML Solostar Pen Inject 2 Units into the skin daily at 10 pm. 15 mL 11  . Lactobacillus-Inulin (Ford) CAPS Take 1 capsule by mouth daily. 14 capsule 0  . Lancets (ONETOUCH ULTRASOFT) lancets Use as instructed 100 each 12  . levothyroxine (SYNTHROID, LEVOTHROID) 175 MCG tablet TAKE 1 TABLET BY MOUTH EVERY DAY 30 tablet 0  . metoprolol succinate (TOPROL-XL) 50 MG 24 hr tablet TAKE 1 TABLET BY MOUTH EVERY DAY 30 tablet 5  . montelukast (SINGULAIR) 10 MG tablet TAKE 1 TABLET BY MOUTH AT BEDTIME 90 tablet 3  . nystatin (MYCOSTATIN) powder Apply topically 2 (two) times daily. on affected areas. 56.7 g 3  . nystatin-triamcinolone ointment (MYCOLOG) Apply 1 application topically 2 (two) times daily. 30 g 0  . PROVENTIL HFA 108 (90 BASE) MCG/ACT inhaler INHALE 2 PUFFS INTO THE LUNGS EVERY 6 HOURS AS NEEDED FOR WHEEZING OR SHORTNESS OF BREATH 6.7 g 2  . spironolactone  (ALDACTONE) 100 MG tablet TAKE 1 TABLET BY MOUTH TWICE DAILY 180 tablet 0  . Syringe/Needle, Disp, (SYRINGE 3CC/25GX1") 25G X 1" 3 ML MISC Use as directed 15 each 0  . phentermine (ADIPEX-P) 37.5 MG tablet 1/2 tablet in the am and early afternoon 30 tablet 2  . albuterol (ACCUNEB) 1.25 MG/3ML nebulizer solution Take 3 mLs (1.25 mg total) by nebulization every 6 (six) hours as needed for wheezing. 75 mL 12  . levothyroxine (SYNTHROID, LEVOTHROID) 175 MCG tablet TAKE 1 TABLET BY MOUTH EVERY DAY 30 tablet 0  . levothyroxine (SYNTHROID, LEVOTHROID) 175 MCG tablet TAKE 1 TABLET BY MOUTH EVERY DAY 30 tablet 0  . levothyroxine (SYNTHROID, LEVOTHROID) 175 MCG tablet TAKE 1 TABLET BY MOUTH EVERY DAY 30 tablet 3  . metoprolol succinate (TOPROL-XL) 50 MG 24 hr tablet TAKE 1 TABLET BY MOUTH EVERY DAY 30 tablet 0  . metoprolol succinate (TOPROL-XL) 50 MG 24 hr tablet TAKE 1 TABLET BY MOUTH EVERY DAY 30 tablet 0  . metoprolol succinate (TOPROL-XL) 50 MG 24 hr tablet TAKE 1 TABLET BY MOUTH EVERY DAY 30 tablet 3   No facility-administered medications prior to visit.    Review of Systems;  Patient denies headache, fevers, malaise, unintentional weight loss, skin rash, eye pain, sinus congestion and sinus pain, sore throat, dysphagia,  hemoptysis , cough, dyspnea, wheezing, chest pain, palpitations, orthopnea, edema, abdominal pain, nausea, melena, diarrhea, constipation, flank pain, dysuria, hematuria, urinary  Frequency, nocturia, numbness, tingling, seizures,  Focal weakness, Loss of consciousness,  Tremor, insomnia, depression, anxiety, and suicidal ideation.      Objective:  BP 118/84 mmHg  Pulse 89  Temp(Src) 98.7 F (37.1 C) (Oral)  Resp 12  Ht _0  (1.727 m)  Wt 237 lb 12 oz (107.843 kg)  BMI 36.16 kg/m2  SpO2 98%  BP Readings from Last 3 Encounters:  10/19/15 118/84  07/19/15 104/66  07/04/15 124/88    Wt Readings from Last 3 Encounters:  10/19/15 237 lb 12 oz (107.843 kg)  07/19/15  241 lb (109.317 kg)  07/04/15 239 lb 8 oz (108.636 kg)    General appearance: alert, cooperative and appears stated age Ears: normal TM's and external ear canals both ears Throat: lips, mucosa, and tongue normal; teeth and gums normal Neck: no adenopathy, no carotid bruit, supple, symmetrical, trachea midline and thyroid not enlarged, symmetric, no tenderness/mass/nodules Back: symmetric, no curvature. ROM normal. No CVA tenderness. Lungs: clear to auscultation bilaterally Heart: regular rate and rhythm, S1, S2 normal, no murmur, click, rub or gallop  Abdomen: soft, non-tender; bowel sounds normal; no masses,  no organomegaly Pulses: 2+ and symmetric Skin: Skin color, texture, turgor normal. No rashes or lesions Lymph nodes: Cervical, supraclavicular, and axillary nodes normal.  Lab Results  Component Value Date   HGBA1C 10.1* 07/04/2015   HGBA1C 7.0* 03/01/2015   HGBA1C 6.4* 10/22/2014    Lab Results  Component Value Date   CREATININE 1.18 07/04/2015   CREATININE 1.03 03/01/2015   CREATININE 1.01 10/22/2014    Lab Results  Component Value Date   WBC 16.6* 07/04/2015   HGB 12.7 07/04/2015   HCT 38.9 07/04/2015   PLT 351.0 07/04/2015   GLUCOSE 291* 07/04/2015   CHOL 112 03/01/2015   TRIG 98.0 03/01/2015   HDL 33.20* 03/01/2015   LDLDIRECT 69.0 03/01/2015   LDLCALC 59 03/01/2015   ALT 40* 07/04/2015   AST 18 07/04/2015   NA 137 07/04/2015   K 4.4 07/04/2015   CL 102 07/04/2015   CREATININE 1.18 07/04/2015   BUN 17 07/04/2015   CO2 25 07/04/2015   TSH 1.97 03/01/2015   HGBA1C 10.1* 07/04/2015   MICROALBUR 0.9 03/01/2015    No results found.  Assessment & Plan:   Problem List Items Addressed This Visit    Obesity (BMI 30-39.9)    I have authorized the use of phentermine for 3 more months, given her success during the last month.  Advised her to start exercising on a regular basis. .  If hsashas not lost 5% of your starting weight by the end of the  3 months we  will have to discontinue it.       Relevant Medications   phentermine (ADIPEX-P) 37.5 MG tablet   Vitamin D deficiency   Relevant Orders   VITAMIN D 25 Hydroxy (Vit-D Deficiency, Fractures)   Hypothyroidism due to acquired atrophy of thyroid - Primary   Relevant Orders   T4 AND TSH    Other Visit Diagnoses    Obesity, diabetes, and hypertension syndrome (HCC)        Relevant Orders    Hemoglobin A1c    Comprehensive metabolic panel    Microalbumin / creatinine urine ratio    Hyperlipidemia        Relevant Orders    Comprehensive metabolic panel    LDL cholesterol, direct    Lipid panel       I am having Ms. Dromgoole maintain her Alum & Mag Hydroxide-Simeth (MAALOX ADVANCED PO), b complex vitamins, ferrous fumarate, albuterol, metoprolol succinate, ONE TOUCH ULTRA SYSTEM KIT, onetouch ultrasoft, [employer], furosemide, SYRINGE 3CC/25GX1", levothyroxine, famotidine, PROVENTIL HFA, spironolactone, ADVAIR DISKUS, ALPRAZolam, chlorhexidine gluconate (SAGE KIT), montelukast, Insulin Glargine, fluconazole, insulin aspart protamine - aspart, glucose blood, ALPRAZolam, B-D UF III MINI PEN NEEDLES, nystatin-triamcinolone ointment, nystatin, B-D UF III MINI PEN NEEDLES, and phentermine.  Meds ordered this encounter  Medications  . phentermine (ADIPEX-P) 37.5 MG tablet    Sig: 1/2 tablet in the am and early afternoon    Dispense:  30 tablet    Refill:  2     A total of 25 minutes of face to face time was spent with patient more than half of which was spent in counselling about the above mentioned conditions  and coordination of care  Medications Discontinued During This Encounter  Medication Reason  . levothyroxine (SYNTHROID, LEVOTHROID) 161 MCG tablet Duplicate  . levothyroxine (SYNTHROID, LEVOTHROID) 175 MCG tablet Error  . levothyroxine (SYNTHROID, LEVOTHROID) 175 MCG tablet Error  . metoprolol succinate (TOPROL-XL) 50  MG 24 hr tablet Duplicate  . metoprolol succinate  (TOPROL-XL) 50 MG 24 hr tablet Error  . metoprolol succinate (TOPROL-XL) 50 MG 24 hr tablet Error  . phentermine (ADIPEX-P) 37.5 MG tablet Reorder    Follow-up: Return in about 3 months (around 01/19/2016).   Crecencio Mc, MD

## 2015-10-22 NOTE — Assessment & Plan Note (Addendum)
Adherence to insulin use has been less than 100%. She is gollowing a low carb diet but not exercising.  She is not fasting and will return for fasting labs  Lab Results  Component Value Date   HGBA1C 10.1* 07/04/2015   Lab Results  Component Value Date   MICROALBUR 0.9 03/01/2015

## 2015-10-22 NOTE — Assessment & Plan Note (Signed)
I have authorized the use of phentermine for 3 more months, given her success during the last month.  Advised her to start exercising on a regular basis. .  If hsashas not lost 5% of your starting weight by the end of the  3 months we will have to discontinue it.

## 2015-10-24 ENCOUNTER — Other Ambulatory Visit (INDEPENDENT_AMBULATORY_CARE_PROVIDER_SITE_OTHER): Payer: BC Managed Care – PPO

## 2015-10-24 DIAGNOSIS — E038 Other specified hypothyroidism: Secondary | ICD-10-CM

## 2015-10-24 DIAGNOSIS — E034 Atrophy of thyroid (acquired): Secondary | ICD-10-CM

## 2015-10-24 DIAGNOSIS — E785 Hyperlipidemia, unspecified: Secondary | ICD-10-CM | POA: Diagnosis not present

## 2015-10-24 DIAGNOSIS — E669 Obesity, unspecified: Secondary | ICD-10-CM | POA: Diagnosis not present

## 2015-10-24 DIAGNOSIS — I152 Hypertension secondary to endocrine disorders: Secondary | ICD-10-CM

## 2015-10-24 DIAGNOSIS — E119 Type 2 diabetes mellitus without complications: Secondary | ICD-10-CM

## 2015-10-24 DIAGNOSIS — E1169 Type 2 diabetes mellitus with other specified complication: Secondary | ICD-10-CM

## 2015-10-24 DIAGNOSIS — E559 Vitamin D deficiency, unspecified: Secondary | ICD-10-CM

## 2015-10-24 DIAGNOSIS — I10 Essential (primary) hypertension: Secondary | ICD-10-CM

## 2015-10-24 DIAGNOSIS — E1159 Type 2 diabetes mellitus with other circulatory complications: Secondary | ICD-10-CM

## 2015-10-24 LAB — COMPREHENSIVE METABOLIC PANEL
ALBUMIN: 4.1 g/dL (ref 3.5–5.2)
ALK PHOS: 117 U/L (ref 39–117)
ALT: 28 U/L (ref 0–35)
AST: 22 U/L (ref 0–37)
BUN: 12 mg/dL (ref 6–23)
CALCIUM: 9.5 mg/dL (ref 8.4–10.5)
CO2: 28 mEq/L (ref 19–32)
CREATININE: 1.17 mg/dL (ref 0.40–1.20)
Chloride: 107 mEq/L (ref 96–112)
GFR: 63.79 mL/min (ref >60.00)
Glucose, Bld: 191 mg/dL — ABNORMAL HIGH (ref 70–99)
POTASSIUM: 4.3 meq/L (ref 3.5–5.1)
SODIUM: 137 meq/L (ref 135–145)
TOTAL PROTEIN: 7.7 g/dL (ref 6.0–8.3)
Total Bilirubin: 0.2 mg/dL (ref 0.2–1.2)

## 2015-10-24 LAB — LIPID PANEL
CHOLESTEROL: 166 mg/dL (ref 0–200)
HDL: 33.2 mg/dL — AB (ref >39.00)
LDL Cholesterol: 115 mg/dL — ABNORMAL HIGH (ref 0–99)
NonHDL: 132.46
Total CHOL/HDL Ratio: 5
Triglycerides: 85 mg/dL (ref 0.0–149.0)
VLDL: 17 mg/dL (ref 0.0–40.0)

## 2015-10-24 LAB — LDL CHOLESTEROL, DIRECT: LDL DIRECT: 131 mg/dL

## 2015-10-24 LAB — MICROALBUMIN / CREATININE URINE RATIO
Creatinine,U: 357.5 mg/dL
MICROALB UR: 2 mg/dL — AB (ref 0.0–1.9)
MICROALB/CREAT RATIO: 0.6 mg/g (ref 0.0–30.0)

## 2015-10-24 LAB — HEMOGLOBIN A1C: Hgb A1c MFr Bld: 7.7 % — ABNORMAL HIGH (ref 4.6–6.5)

## 2015-10-24 LAB — VITAMIN D 25 HYDROXY (VIT D DEFICIENCY, FRACTURES): VITD: 47.46 ng/mL (ref 30.00–100.00)

## 2015-10-25 LAB — T4 AND TSH
T4 TOTAL: 8.8 ug/dL (ref 4.5–12.0)
TSH: 1.52 u[IU]/mL (ref 0.450–4.500)

## 2015-10-26 ENCOUNTER — Encounter: Payer: Self-pay | Admitting: Internal Medicine

## 2015-11-05 ENCOUNTER — Other Ambulatory Visit: Payer: Self-pay | Admitting: Internal Medicine

## 2015-11-06 ENCOUNTER — Telehealth: Payer: BC Managed Care – PPO | Admitting: Nurse Practitioner

## 2015-11-06 DIAGNOSIS — L0100 Impetigo, unspecified: Secondary | ICD-10-CM

## 2015-11-06 MED ORDER — MUPIROCIN CALCIUM 2 % EX CREA: 1.0000 "application " | TOPICAL_CREAM | Freq: Two times a day (BID) | CUTANEOUS | Status: DC

## 2015-11-06 NOTE — Progress Notes (Signed)
E Visit for Rash  We are sorry that you are not feeling well. Here is how we plan to help.  Impetigo which usually comes from dirt. i have rx bactroban cream apply bid. Avoid picking or scratching at area. Good handwashing.       HOME CARE:   Take cool showers and avoid direct sunlight.  Apply cool compress or wet dressings.  Take a bath in an oatmeal bath.  Sprinkle content of one Aveeno packet under running faucet with comfortably warm water.  Bathe for 15-20 minutes, 1-2 times daily.  Pat dry with a towel. Do not rub the rash.  Use hydrocortisone cream.  Take an antihistamine like Benadryl for widespread rashes that itch.  The adult dose of Benadryl is 25-50 mg by mouth 4 times daily.  Caution:  This type of medication may cause sleepiness.  Do not drink alcohol, drive, or operate dangerous machinery while taking antihistamines.  Do not take these medications if you have prostate enlargement.  Read package instructions thoroughly on all medications that you take.  GET HELP RIGHT AWAY IF:   Symptoms don't go away after treatment.  Severe itching that persists.  If you rash spreads or swells.  If you rash begins to smell.  If it blisters and opens or develops a yellow-brown crust.  You develop a fever.  You have a sore throat.  You become short of breath.  MAKE SURE YOU:  Understand these instructions. Will watch your condition. Will get help right away if you are not doing well or get worse.  Thank you for choosing an e-visit. Your e-visit answers were reviewed by a board certified advanced clinical practitioner to complete your personal care plan. Depending upon the condition, your plan could have included both over the counter or prescription medications. Please review your pharmacy choice. Be sure that the pharmacy you have chosen is open so that you can pick up your prescription now.  If there is a problem you may message your provider in MyChart to have the  prescription routed to another pharmacy. Your safety is important to us. If you have drug allergies check your prescription carefully.  For the next 24 hours, you can use MyChart to ask questions about today's visit, request a non-urgent call back, or ask for a work or school excuse from your e-visit provider. You will get an email in the next two days asking about your experience. I hope that your e-visit has been valuable and will speed your recovery.

## 2015-11-07 ENCOUNTER — Telehealth: Payer: Self-pay

## 2015-11-07 NOTE — Telephone Encounter (Signed)
refilled 

## 2015-11-07 NOTE — Telephone Encounter (Signed)
Last OV 6/17 ok to fill Phentermine ?

## 2015-11-07 NOTE — Telephone Encounter (Signed)
PA for Phentermine completed on Cover my meds. Thanks

## 2015-11-08 NOTE — Telephone Encounter (Signed)
please notify patient

## 2015-11-08 NOTE — Telephone Encounter (Signed)
Left VM for patient. thanks

## 2015-11-08 NOTE — Telephone Encounter (Signed)
PA denied because she used it in the past year for 3 months.    It is covered when you have not received 3 months of therapy within the past year.

## 2015-11-16 ENCOUNTER — Ambulatory Visit (INDEPENDENT_AMBULATORY_CARE_PROVIDER_SITE_OTHER): Payer: BC Managed Care – PPO | Admitting: Internal Medicine

## 2015-11-16 ENCOUNTER — Encounter: Payer: Self-pay | Admitting: Internal Medicine

## 2015-11-16 VITALS — BP 104/68 | HR 72 | Temp 98.7°F | Resp 16 | Wt 240.0 lb

## 2015-11-16 DIAGNOSIS — J40 Bronchitis, not specified as acute or chronic: Secondary | ICD-10-CM | POA: Diagnosis not present

## 2015-11-16 DIAGNOSIS — J4521 Mild intermittent asthma with (acute) exacerbation: Secondary | ICD-10-CM

## 2015-11-16 MED ORDER — METHYLPREDNISOLONE ACETATE 40 MG/ML IJ SUSP
40.0000 mg | Freq: Once | INTRAMUSCULAR | Status: AC
  Administered 2015-11-16: 40 mg via INTRAMUSCULAR

## 2015-11-16 MED ORDER — DOXYCYCLINE HYCLATE 100 MG PO CAPS: 100.0000 mg | ORAL_CAPSULE | Freq: Two times a day (BID) | ORAL | 0 refills | Status: DC

## 2015-11-16 NOTE — Progress Notes (Signed)
Subjective:  Patient ID: Madeline Strickland, female    DOB: 29-Aug-1970  Age: 45 y.o. MRN: [id number]  CC: The primary encounter diagnosis was Asthma with acute exacerbation, mild intermittent. A diagnosis of Bronchitis was also pertinent to this visit.  HPI [name] presents for bronchitwis x 2 weeks, with fever,  Cough productive pf purulent sputum at  times,  chest tightness,  left ear mild pain .  Symptoms coincided  with move to new building at work .  Started with a dry cough,  Then became productive  Developed a nasal ulcer on July  16 treated at E visit given tx for impetigo with topical bactrim .  Started looking better n in 5 days   Slept 18 hours yesterday,  Taking Advair using albuterol MDI every 6 hours,  Taking mucinexz and sudafed.     Outpatient Medications Prior to Visit  Medication Sig Dispense Refill  . ADVAIR DISKUS 250-50 MCG/DOSE AEPB INHALE 1 PUFF INTO THE LUNGS TWICE DAILY 60 each 0  . ALPRAZolam (XANAX) 0.5 MG tablet TAKE 1 TABLET BY MOUTH TWICE DAILY AS NEEDED FOR ANXIETY OR INSOMNIA 60 tablet 4  . Alum & Mag Hydroxide-Simeth (MAALOX ADVANCED PO) Take by mouth at bedtime.      Marland Kitchen b complex vitamins tablet Take 1 tablet by mouth daily.      . B-D UF III MINI PEN NEEDLES 31G X 5 MM MISC USE AS DIRECTED THREE TIMES DAILY 100 each 0  . B-D UF III MINI PEN NEEDLES 31G X 5 MM MISC USE AS DIRECTED THREE TIMES DAILY 100 each 0  . Blood Glucose Monitoring Suppl (ONE TOUCH ULTRA SYSTEM KIT) W/DEVICE KIT 1 kit by Does not apply route once. 1 each 0  . famotidine (PEPCID) 20 MG tablet TAKE 1 TABLET(20 MG) BY MOUTH TWICE DAILY 60 tablet 11  . ferrous fumarate (HEMOCYTE - 106 MG FE) 325 (106 FE) MG TABS Take 1 tablet by mouth.      Marland Kitchen glucose blood (FREESTYLE TEST STRIPS) test strip Use to check sugar before meals. 100 each 5  . insulin aspart protamine - aspart (NOVOLOG 70/30 MIX) (70-30) 100 UNIT/ML FlexPen 15 UNITS IN THE AM AND 25 UNITS  IN THE PM BEFORE MEALS 15 mL 3  . Lancets  (ONETOUCH ULTRASOFT) lancets Use as instructed 100 each 12  . levothyroxine (SYNTHROID, LEVOTHROID) 175 MCG tablet TAKE 1 TABLET BY MOUTH EVERY DAY 30 tablet 0  . metoprolol succinate (TOPROL-XL) 50 MG 24 hr tablet TAKE 1 TABLET BY MOUTH EVERY DAY 30 tablet 5  . montelukast (SINGULAIR) 10 MG tablet TAKE 1 TABLET BY MOUTH AT BEDTIME 90 tablet 3  . mupirocin cream (BACTROBAN) 2 % Apply 1 application topically 2 (two) times daily. 15 g 0  . nystatin (MYCOSTATIN) powder Apply topically 2 (two) times daily. on affected areas. 56.7 g 3  . phentermine (ADIPEX-P) 37.5 MG tablet TAKE 1/2 TABLETS IN THE AM AND EARLY AFTERNOON 30 tablet 2  . PROVENTIL HFA 108 (90 BASE) MCG/ACT inhaler INHALE 2 PUFFS INTO THE LUNGS EVERY 6 HOURS AS NEEDED FOR WHEEZING OR SHORTNESS OF BREATH 6.7 g 2  . spironolactone (ALDACTONE) 100 MG tablet TAKE 1 TABLET BY MOUTH TWICE DAILY 180 tablet 0  . Syringe/Needle, Disp, (SYRINGE 3CC/25GX1") 25G X 1" 3 ML MISC Use as directed 15 each 0  . albuterol (ACCUNEB) 1.25 MG/3ML nebulizer solution Take 3 mLs (1.25 mg total) by nebulization every 6 (six) hours as needed  for wheezing. 75 mL 12  . nystatin-triamcinolone ointment (MYCOLOG) Apply 1 application topically 2 (two) times daily. (Patient not taking: Reported on 11/16/2015) 30 g 0  . ALPRAZolam (XANAX XR) 0.5 MG 24 hr tablet Take 1 tablet (0.5 mg total) by mouth daily. (Patient not taking: Reported on 11/16/2015) 30 tablet 2  . chlorhexidine gluconate (PERIDEX) 0.12 % solution Use as directed 15 mLs in the mouth or throat 2 (two) times daily. (Patient not taking: Reported on 11/16/2015) 120 mL 0  . fluconazole (DIFLUCAN) 150 MG tablet Take 1 tablet (150 mg total) by mouth daily. 2 tablet 1  . furosemide (LASIX) 20 MG tablet Take 1 tablet (20 mg total) by mouth daily. As needed for fluid retention (Patient not taking: Reported on 11/16/2015) 30 tablet 3  . Insulin Glargine (BASAGLAR KWIKPEN) 100 UNIT/ML Solostar Pen Inject 2 Units into the skin  daily at 10 pm. (Patient not taking: Reported on 11/16/2015) 15 mL 11  . Lactobacillus-Inulin ([place]) CAPS Take 1 capsule by mouth daily. (Patient not taking: Reported on 11/16/2015) 14 capsule 0   No facility-administered medications prior to visit.     Review of Systems;  Patient denies headache, fevers, malaise, unintentional weight loss, skin rash, eye pain, sinus congestion and sinus pain, sore throat, dysphagia,  hemoptysis , cough, dyspnea, wheezing, chest pain, palpitations, orthopnea, edema, abdominal pain, nausea, melena, diarrhea, constipation, flank pain, dysuria, hematuria, urinary  Frequency, nocturia, numbness, tingling, seizures,  Focal weakness, Loss of consciousness,  Tremor, insomnia, depression, anxiety, and suicidal ideation.      Objective:  BP 104/68 (BP Location: Left Arm, Patient Position: Sitting, Cuff Size: Large)   Pulse 72   Temp 98.7 F (37.1 C) (Oral)   Resp 16   Wt 240 lb (108.9 kg)   SpO2 98%   BMI 36.49 kg/m   BP Readings from Last 3 Encounters:  11/16/15 104/68  10/19/15 118/84  07/19/15 104/66    Wt Readings from Last 3 Encounters:  11/16/15 240 lb (108.9 kg)  10/19/15 237 lb 12 oz (107.8 kg)  07/19/15 241 lb (109.3 kg)    General appearance: alert, cooperative and appears stated age Ears: normal TM's and external ear canals both ears Throat: lips, mucosa, and tongue normal; teeth and gums normal Neck: no adenopathy, no carotid bruit, supple, symmetrical, trachea midline and thyroid not enlarged, symmetric, no tenderness/mass/nodules Back: symmetric, no curvature. ROM normal. No CVA tenderness. Lungs: clear to auscultation bilaterally Heart: regular rate and rhythm, S1, S2 normal, no murmur, click, rub or gallop Abdomen: soft, non-tender; bowel sounds normal; no masses,  no organomegaly Pulses: 2+ and symmetric Skin: Skin color, texture, turgor normal. No rashes or lesions Lymph nodes: Cervical, supraclavicular, and  axillary nodes normal.  Lab Results  Component Value Date   HGBA1C 7.7 (H) 10/24/2015   HGBA1C 10.1 (H) 07/04/2015   HGBA1C 7.0 (H) 03/01/2015    Lab Results  Component Value Date   CREATININE 1.17 10/24/2015   CREATININE 1.18 07/04/2015   CREATININE 1.03 03/01/2015    Lab Results  Component Value Date   WBC 16.6 (H) 07/04/2015   HGB 12.7 07/04/2015   HCT 38.9 07/04/2015   PLT 351.0 07/04/2015   GLUCOSE 191 (H) 10/24/2015   CHOL 166 10/24/2015   TRIG 85.0 10/24/2015   HDL 33.20 (L) 10/24/2015   LDLDIRECT 131.0 10/24/2015   LDLCALC 115 (H) 10/24/2015   ALT 28 10/24/2015   AST 22 10/24/2015   NA 137 10/24/2015  K 4.3 10/24/2015   CL 107 10/24/2015   CREATININE 1.17 10/24/2015   BUN 12 10/24/2015   CO2 28 10/24/2015   TSH 1.520 10/24/2015   HGBA1C 7.7 (H) 10/24/2015   MICROALBUR 2.0 (H) 10/24/2015    No results found.  Assessment & Plan:   Problem List Items Addressed This Visit    RESOLVED: Bronchitis    Lung exam is normal,  But history is suggestive of mucopurulent bronchitis,   Depo Medrol,  Doxycycline. N       Other Visit Diagnoses    Asthma with acute exacerbation, mild intermittent    -  Primary   Relevant Medications   methylPREDNISolone acetate (DEPO-MEDROL) injection 40 mg (Completed)      I have discontinued Ms. Viramontes [facility], furosemide, chlorhexidine gluconate (SAGE KIT), Insulin Glargine, and fluconazole. I am also having her start on doxycycline. Additionally, I am having her maintain her Alum & Mag Hydroxide-Simeth (MAALOX ADVANCED PO), b complex vitamins, ferrous fumarate, albuterol, metoprolol succinate, ONE TOUCH ULTRA SYSTEM KIT, onetouch ultrasoft, SYRINGE 3CC/25GX1", levothyroxine, famotidine, PROVENTIL HFA, spironolactone, ADVAIR DISKUS, montelukast, insulin aspart protamine - aspart, glucose blood, B-D UF III MINI PEN NEEDLES, nystatin-triamcinolone ointment, nystatin, B-D UF III MINI PEN NEEDLES, ALPRAZolam,  phentermine, and mupirocin cream. We administered methylPREDNISolone acetate.  Meds ordered this encounter  Medications  . doxycycline (VIBRAMYCIN) 100 MG capsule    Sig: Take 1 capsule (100 mg total) by mouth 2 (two) times daily. WITH FOOD    Dispense:  14 capsule    Refill:  0  . methylPREDNISolone acetate (DEPO-MEDROL) injection 40 mg    Medications Discontinued During This Encounter  Medication Reason  . chlorhexidine gluconate (PERIDEX) 0.12 % solution Completed Course  . fluconazole (DIFLUCAN) 150 MG tablet One time medication  . Lactobacillus-Inulin ([place]) CAPS   . Insulin Glargine (BASAGLAR KWIKPEN) 100 UNIT/ML Solostar Pen   . furosemide (LASIX) 20 MG tablet   . ALPRAZolam (XANAX XR) 0.5 MG 24 hr tablet     Follow-up: No Follow-up on file.   Crecencio Mc, MD

## 2015-11-16 NOTE — Patient Instructions (Signed)
YOU RECEIVED A STEROID INJECTIO N TODAY INSTEAD OF A PREDNISONE TAPER  I am prescribing oral doxycycline to take twice daily with food for 7 days   Please take a probiotic ( Align, Floraque or Culturelle) of the generic version of one of these  For a minimum of 3 weeks to prevent a serious antibiotic associated diarrhea  Called clostridium dificile colitis

## 2015-11-19 NOTE — Assessment & Plan Note (Signed)
Lung exam is normal,  But history is suggestive of mucopurulent bronchitis,   Depo Medrol,  Doxycycline. N

## 2015-11-23 ENCOUNTER — Other Ambulatory Visit: Payer: Self-pay | Admitting: Internal Medicine

## 2015-12-05 ENCOUNTER — Ambulatory Visit (INDEPENDENT_AMBULATORY_CARE_PROVIDER_SITE_OTHER): Payer: BC Managed Care – PPO | Admitting: Family Medicine

## 2015-12-05 ENCOUNTER — Encounter: Payer: Self-pay | Admitting: Family Medicine

## 2015-12-05 ENCOUNTER — Ambulatory Visit (INDEPENDENT_AMBULATORY_CARE_PROVIDER_SITE_OTHER): Payer: BC Managed Care – PPO

## 2015-12-05 VITALS — BP 132/87 | HR 88 | Temp 98.4°F | Wt 242.0 lb

## 2015-12-05 DIAGNOSIS — R053 Chronic cough: Secondary | ICD-10-CM

## 2015-12-05 DIAGNOSIS — R05 Cough: Secondary | ICD-10-CM | POA: Insufficient documentation

## 2015-12-05 MED ORDER — PREDNISONE 50 MG PO TABS: ORAL_TABLET | ORAL | 0 refills | Status: DC

## 2015-12-05 NOTE — Assessment & Plan Note (Signed)
New problem. Patient now with chronic cough. I suspect that this is postinfectious and secondary to noncompliance with Advair. Discussed treatment with prednisone. Patient in agreement. She will monitor her sugars closely and increase insulin if needed. Given duration of symptoms also obtaining chest x-ray. If persist will need to see pulmonology.

## 2015-12-05 NOTE — Progress Notes (Signed)
Pre visit review using our clinic review tool, if applicable. No additional management support is needed unless otherwise documented below in the visit note. 

## 2015-12-05 NOTE — Progress Notes (Signed)
Subjective:  Patient ID: Madeline Strickland, female    DOB: 03/30/1971  Age: 45 y.o. MRN: [id number]  CC: Cough, congestion  HPI:  45 year old female with DM 2, asthma, tobacco abuse presents with complaints of cough and congestion.  Patient reports a six-week history of chest congestion and cough. She states her cough is productive of discolored sputum. No associated fever. No increasing shortness of breath. She does have shortness of breath at baseline due to asthma and ? COPD.  Patient states that she recently saw her primary care physician and was placed on doxycycline for bronchitis (11/16/15). Patient states that she's had little improvement since then. In fact her cough has worsened. Of note, patient states that she has not been compliant with her Advair until recently. No other complaints at this time.  Social Hx   Social History   Social History  . Marital status: Married    Spouse name: N/A  . Number of children: N/A  . Years of education: N/A   Social History Main Topics  . Smoking status: Current Every Day Smoker    Types: Cigarettes  . Smokeless tobacco: Never Used     Comment: smokes 1-2 cigs a day  . Alcohol use No  . Drug use: No  . Sexual activity: Not Asked   Other Topics Concern  . None   Social History Narrative  . None   Review of Systems  Constitutional: Negative for fever.  Respiratory: Positive for cough and shortness of breath.        Chest congestion.   Objective:  BP 132/87 (BP Location: Right Arm, Patient Position: Sitting, Cuff Size: Large)   Pulse 88   Temp 98.4 F (36.9 C) (Oral)   Wt 242 lb (109.8 kg)   SpO2 98%   BMI 36.80 kg/m   BP/Weight 12/05/2015 11/16/2015 10/19/2015  Systolic BP 132 104 118  Diastolic BP 87 68 84  Wt. (Lbs) 242 240 237.75  BMI 36.8 36.49 36.16   Physical Exam  Constitutional: She is oriented to person, place, and time. She appears well-developed. No distress.  HENT:  Mouth/Throat: Oropharynx is clear and moist.    Cardiovascular: Normal rate and regular rhythm.   Pulmonary/Chest: Effort normal.  Coarse breaths sounds noted with cough. Otherwise clear.   Neurological: She is alert and oriented to person, place, and time.  Psychiatric: She has a normal mood and affect.  Vitals reviewed.  Lab Results  Component Value Date   WBC 16.6 (H) 07/04/2015   HGB 12.7 07/04/2015   HCT 38.9 07/04/2015   PLT 351.0 07/04/2015   GLUCOSE 191 (H) 10/24/2015   CHOL 166 10/24/2015   TRIG 85.0 10/24/2015   HDL 33.20 (L) 10/24/2015   LDLDIRECT 131.0 10/24/2015   LDLCALC 115 (H) 10/24/2015   ALT 28 10/24/2015   AST 22 10/24/2015   NA 137 10/24/2015   K 4.3 10/24/2015   CL 107 10/24/2015   CREATININE 1.17 10/24/2015   BUN 12 10/24/2015   CO2 28 10/24/2015   TSH 1.520 10/24/2015   HGBA1C 7.7 (H) 10/24/2015   MICROALBUR 2.0 (H) 10/24/2015    Assessment & Plan:   Problem List Items Addressed This Visit    Chronic cough - Primary    New problem. Patient now with chronic cough. I suspect that this is postinfectious and secondary to noncompliance with Advair. Discussed treatment with prednisone. Patient in agreement. She will monitor her sugars closely and increase insulin if needed. Given duration of symptoms  also obtaining chest x-ray. If persist will need to see pulmonology.      Relevant Medications   predniSONE (DELTASONE) 50 MG tablet   Other Relevant Orders   DG Chest 2 View    Other Visit Diagnoses   None.     Meds ordered this encounter  Medications  . predniSONE (DELTASONE) 50 MG tablet    Sig: 1 tablet daily x 5 days.    Dispense:  5 tablet    Refill:  0   Follow-up: PRN  Everlene OtherJayce Rex Magee DO [facility] [place]

## 2015-12-05 NOTE — Patient Instructions (Signed)
Take the prednisone as prescribed.  Monitor your blood sugars closely. You may need to increase her insulin.  We will call your chest x-ray results.  Please be sure to take your Advair twice daily every day.  Follow-up if it persists/worsens.  Take care  Dr. Adriana Simasook

## 2015-12-10 ENCOUNTER — Other Ambulatory Visit: Payer: Self-pay | Admitting: Internal Medicine

## 2015-12-15 ENCOUNTER — Encounter: Payer: Self-pay | Admitting: Internal Medicine

## 2016-01-01 ENCOUNTER — Telehealth: Payer: BC Managed Care – PPO | Admitting: Nurse Practitioner

## 2016-01-01 DIAGNOSIS — J0101 Acute recurrent maxillary sinusitis: Secondary | ICD-10-CM

## 2016-01-01 MED ORDER — AMOXICILLIN-POT CLAVULANATE 875-125 MG PO TABS: 1.0000 | ORAL_TABLET | Freq: Two times a day (BID) | ORAL | 0 refills | Status: DC

## 2016-01-01 NOTE — Progress Notes (Signed)

## 2016-01-17 ENCOUNTER — Other Ambulatory Visit: Payer: Self-pay | Admitting: Internal Medicine

## 2016-01-24 ENCOUNTER — Other Ambulatory Visit: Payer: Self-pay | Admitting: Internal Medicine

## 2016-01-27 ENCOUNTER — Telehealth: Payer: Self-pay

## 2016-01-27 NOTE — Telephone Encounter (Signed)
Called patient. Left vmail. Calling to inform Phentermine PA was denied, but resubmitted with additional info today, so PA is still processing.

## 2016-02-01 ENCOUNTER — Encounter: Payer: Self-pay | Admitting: Internal Medicine

## 2016-02-24 ENCOUNTER — Other Ambulatory Visit: Payer: Self-pay | Admitting: Internal Medicine

## 2016-03-07 ENCOUNTER — Other Ambulatory Visit: Payer: Self-pay | Admitting: Internal Medicine

## 2016-03-31 ENCOUNTER — Telehealth: Payer: BC Managed Care – PPO | Admitting: Nurse Practitioner

## 2016-03-31 DIAGNOSIS — B370 Candidal stomatitis: Secondary | ICD-10-CM

## 2016-03-31 MED ORDER — NYSTATIN 100000 UNIT/ML MT SUSP: 5.0000 mL | Freq: Four times a day (QID) | OROMUCOSAL | 0 refills | Status: DC

## 2016-03-31 NOTE — Progress Notes (Signed)
Madeline Strickland is a yeast infection of the mouth. I have prescribed nystatin suspension to swish and swallow 4X a day.

## 2016-04-14 ENCOUNTER — Other Ambulatory Visit: Payer: Self-pay | Admitting: Internal Medicine

## 2016-04-24 ENCOUNTER — Telehealth: Payer: Self-pay | Admitting: Internal Medicine

## 2016-04-24 NOTE — Telephone Encounter (Signed)
Patient is to call and schedule appointment for Pap and yearly CPE.

## 2016-04-24 NOTE — Telephone Encounter (Signed)
Pt sent a Mychart msg wanting to get a referral to get a Gyn and a Mammo done. Thank you!

## 2016-05-21 ENCOUNTER — Ambulatory Visit (INDEPENDENT_AMBULATORY_CARE_PROVIDER_SITE_OTHER): Payer: BC Managed Care – PPO | Admitting: Internal Medicine

## 2016-05-21 ENCOUNTER — Encounter: Payer: Self-pay | Admitting: Internal Medicine

## 2016-05-21 VITALS — BP 118/72 | HR 86 | Temp 98.7°F | Resp 16 | Wt 244.0 lb

## 2016-05-21 DIAGNOSIS — D510 Vitamin B12 deficiency anemia due to intrinsic factor deficiency: Secondary | ICD-10-CM | POA: Diagnosis not present

## 2016-05-21 DIAGNOSIS — E1169 Type 2 diabetes mellitus with other specified complication: Secondary | ICD-10-CM | POA: Diagnosis not present

## 2016-05-21 DIAGNOSIS — E785 Hyperlipidemia, unspecified: Secondary | ICD-10-CM | POA: Diagnosis not present

## 2016-05-21 DIAGNOSIS — R6889 Other general symptoms and signs: Secondary | ICD-10-CM | POA: Diagnosis not present

## 2016-05-21 DIAGNOSIS — E669 Obesity, unspecified: Secondary | ICD-10-CM

## 2016-05-21 DIAGNOSIS — IMO0001 Reserved for inherently not codable concepts without codable children: Secondary | ICD-10-CM

## 2016-05-21 DIAGNOSIS — Z794 Long term (current) use of insulin: Secondary | ICD-10-CM

## 2016-05-21 DIAGNOSIS — I10 Essential (primary) hypertension: Secondary | ICD-10-CM | POA: Diagnosis not present

## 2016-05-21 DIAGNOSIS — E034 Atrophy of thyroid (acquired): Secondary | ICD-10-CM

## 2016-05-21 DIAGNOSIS — E559 Vitamin D deficiency, unspecified: Secondary | ICD-10-CM

## 2016-05-21 DIAGNOSIS — Z6837 Body mass index (BMI) 37.0-37.9, adult: Secondary | ICD-10-CM

## 2016-05-21 DIAGNOSIS — E1100 Type 2 diabetes mellitus with hyperosmolarity without nonketotic hyperglycemic-hyperosmolar coma (NKHHC): Secondary | ICD-10-CM

## 2016-05-21 DIAGNOSIS — E6609 Other obesity due to excess calories: Secondary | ICD-10-CM

## 2016-05-21 LAB — POCT GLYCOSYLATED HEMOGLOBIN (HGB A1C): Hemoglobin A1C: 8

## 2016-05-21 LAB — POCT INFLUENZA A/B
INFLUENZA A, POC: NEGATIVE
INFLUENZA B, POC: NEGATIVE

## 2016-05-21 MED ORDER — LIRAGLUTIDE -WEIGHT MANAGEMENT 18 MG/3ML ~~LOC~~ SOPN: 0.6000 mg | PEN_INJECTOR | Freq: Every day | SUBCUTANEOUS | 0 refills | Status: DC

## 2016-05-21 MED ORDER — OSELTAMIVIR PHOSPHATE 75 MG PO CAPS: 75.0000 mg | ORAL_CAPSULE | Freq: Every day | ORAL | 0 refills | Status: DC

## 2016-05-21 NOTE — Patient Instructions (Signed)
I am recommending use of the medication called Saxenda to help you lose weight.  It is similar to a a medicine that is used to treat diabetes called Victoza,  So It may lower your blood sugars .   It is injected daily in incrementally increasing doses (if tolerated,  Nausea usually resolves in a few days)"  0.6 mg daily   Week 1 1.2 mg daily Week 2 1.8 mg  Daly Week 3 2.4 mg daily Week 4 3.0 mg daily Week 5 and ongoing   If you want to bring the pen with you to be shown how to give yourself the dose,  We wil make you an  RN visit once you pick up your medication from your pharmacy    I have sent in  An rx for tamiflu to keep on hand .  The dose is one tablet daily for 10 days for  Prevention  (post exposure prophylaxis) And  Increase dose to one tablet  Twice daily  if you develop flu symptoms (fever > 100.5,  Severe body aches, cough, headache diarrhea. ,

## 2016-05-21 NOTE — Progress Notes (Signed)
Subjective:  Patient ID: Madeline Strickland, female    DOB: 1971-04-01  Age: 46 y.o. MRN: [id number]  CC: The primary encounter diagnosis was Hypothyroidism due to acquired atrophy of thyroid. Diagnoses of Essential hypertension, Uncontrolled type 2 diabetes mellitus with hyperosmolarity without coma, with long-term current use of insulin ([place]), Hyperlipidemia associated with type 2 diabetes mellitus ([place]), Vitamin B12 deficiency anemia due to intrinsic factor deficiency, Vitamin D deficiency, Class 2 obesity due to excess calories with serious comorbidity and body mass index (BMI) of 37.0 to 37.9 in adult, Flu-like symptoms, and Obesity (BMI 30-39.9) were also pertinent to this visit.  HPI Madeline Strickland presents for 6 month follow up on diabetes and hypertension.    Left ear pain, drainage, following a viral uri for two  Weeks.has been out of work for 2 weeks Body aches, temp of 99,  some diarrhea  3-4 loose stools daily  .  The fatigue diarrhea,  Body aches have improved. Feeling lighted headed  And vertigo, pressure behind the eyes for the last several days,  No asthma exacerbation .   Today is the first day out of the house.    Checking sugars,  Have been very high to 334 during the past week occurred bc she omitted her insulin injection on only one occasion  .    Currently taking 70/30 soe adjusted 18 -21 for am ,  Evening readings higher and averaging  20-30  Units,     no neuropathy  Last eye exam  In May   A1C pocT WAS 8. FLU TEST NEGATIVE  DISCUSSED STARTING SAXENDA   Lab Results  Component Value Date   MICROALBUR 2.0 (H) 10/24/2015     Lab Results  Component Value Date   HGBA1C 8.0 05/21/2016       Positive flu exposure without fever, several sick employees Or severe symptoms .       Patient has no complaints today.  Patient is following a low glycemic index diet and taking all prescribed medications regularly without side effects.  Fasting sugars have been under less than 140  most of the time and post prandials have been under 160 except on rare occasions. Patient is exercising about 3 times per week and intentionally trying to lose weight .  Patient has had an eye exam in the last 12 months and checks feet regularly for signs of infection.  Patient does not walk barefoot outside,  And denies an numbness tingling or burning in feet. Patient is up to date on all recommended vaccinations  Outpatient Medications Prior to Visit  Medication Sig Dispense Refill  . ADVAIR DISKUS 250-50 MCG/DOSE AEPB INHALE 1 PUFF INTO THE LUNGS TWICE DAILY 14 each 0  . ALPRAZolam (XANAX) 0.5 MG tablet TAKE 1 TABLET BY MOUTH TWICE DAILY AS NEEDED FOR ANXIETY OR INSOMNIA 60 tablet 4  . Alum & Mag Hydroxide-Simeth (MAALOX ADVANCED PO) Take by mouth at bedtime.      Marland Kitchen b complex vitamins tablet Take 1 tablet by mouth daily.      . B-D UF III MINI PEN NEEDLES 31G X 5 MM MISC USE AS DIRECTED THREE TIMES DAILY 100 each 3  . Blood Glucose Monitoring Suppl (ONE TOUCH ULTRA SYSTEM KIT) W/DEVICE KIT 1 kit by Does not apply route once. 1 each 0  . famotidine (PEPCID) 20 MG tablet TAKE 1 TABLET(20 MG) BY MOUTH TWICE DAILY 60 tablet 3  . ferrous fumarate (HEMOCYTE - 106 MG FE) 325 (106  FE) MG TABS Take 1 tablet by mouth.      . Lancets (ONETOUCH ULTRASOFT) lancets Use as instructed 100 each 12  . levothyroxine (SYNTHROID, LEVOTHROID) 175 MCG tablet TAKE 1 TABLET BY MOUTH EVERY DAY 30 tablet 3  . metoprolol succinate (TOPROL-XL) 50 MG 24 hr tablet TAKE 1 TABLET BY MOUTH EVERY DAY 30 tablet 3  . montelukast (SINGULAIR) 10 MG tablet TAKE 1 TABLET BY MOUTH AT BEDTIME 90 tablet 3  . NOVOLOG MIX 70/30 FLEXPEN (70-30) 100 UNIT/ML FlexPen INJECT 15 UNITS UNDER THE SKIN IN THE MORNING AND 25 UNITS IN THE EVENING BEFORE MEALS 15 mL 5  . nystatin (MYCOSTATIN) 100000 UNIT/ML suspension Take 5 mLs (500,000 Units total) by mouth 4 (four) times daily. 120 mL 0  . nystatin (MYCOSTATIN) powder Apply topically 2 (two) times  daily. on affected areas. 56.7 g 3  . nystatin-triamcinolone ointment (MYCOLOG) Apply 1 application topically 2 (two) times daily. 30 g 0  . ONETOUCH VERIO test strip USE TO CHECK SUGAR BEFORE MEALS 100 each 1  . PROAIR HFA 108 (90 Base) MCG/ACT inhaler INHALE 2 PUFFS INTO THE LUNGS EVERY 6 HOURS AS NEEDED FOR WHEEZING OR SHORTNESS OF BREATH. 8.5 g 0  . spironolactone (ALDACTONE) 100 MG tablet TAKE 1 TABLET BY MOUTH TWICE DAILY 180 tablet 0  . Syringe/Needle, Disp, (SYRINGE 3CC/25GX1") 25G X 1" 3 ML MISC Use as directed 15 each 0  . amoxicillin-clavulanate (AUGMENTIN) 875-125 MG tablet Take 1 tablet by mouth 2 (two) times daily. 20 tablet 0  . phentermine (ADIPEX-P) 37.5 MG tablet TAKE 1/2 TABLETS IN THE AM AND EARLY AFTERNOON 30 tablet 2  . albuterol (ACCUNEB) 1.25 MG/3ML nebulizer solution Take 3 mLs (1.25 mg total) by nebulization every 6 (six) hours as needed for wheezing. 75 mL 12   No facility-administered medications prior to visit.     Review of Systems;  Patient denies , unintentional weight loss, skin rash, eye pain,  dysphagia,  hemoptysis , dyspnea, wheezing, chest pain, palpitations, orthopnea, edema, abdominal pain, nausea, melena, diarrhea, constipation, flank pain, dysuria, hematuria, urinary  Frequency, nocturia, numbness, tingling, seizures,  Focal weakness, Loss of consciousness,  Tremor, insomnia, depression, anxiety, and suicidal ideation.      Objective:  BP 118/72   Pulse 86   Temp 98.7 F (37.1 C) (Oral)   Resp 16   Wt 244 lb (110.7 kg)   SpO2 97%   BMI 37.10 kg/m   BP Readings from Last 3 Encounters:  05/21/16 118/72  12/05/15 132/87  11/16/15 104/68    Wt Readings from Last 3 Encounters:  05/21/16 244 lb (110.7 kg)  12/05/15 242 lb (109.8 kg)  11/16/15 240 lb (108.9 kg)    General appearance: alert, cooperative and appears stated age Ears: normal TM's and external ear canals both ears Throat: lips, mucosa, and tongue normal; teeth and gums  normal Neck: no adenopathy, no carotid bruit, supple, symmetrical, trachea midline and thyroid not enlarged, symmetric, no tenderness/mass/nodules Back: symmetric, no curvature. ROM normal. No CVA tenderness. Lungs: clear to auscultation bilaterally no wheezing  Heart: regular rate and rhythm, S1, S2 normal, no murmur, click, rub or gallop Abdomen: soft, non-tender; bowel sounds normal; no masses,  no organomegaly Pulses: 2+ and symmetric Skin: Skin color, texture, turgor normal. No rashes or lesions Lymph nodes: Cervical, supraclavicular, and axillary nodes normal.  Lab Results  Component Value Date   HGBA1C 8.0 05/21/2016   HGBA1C 7.7 (H) 10/24/2015   HGBA1C 10.1 (H) 07/04/2015  Lab Results  Component Value Date   CREATININE 1.03 05/21/2016   CREATININE 1.17 10/24/2015   CREATININE 1.18 07/04/2015    Lab Results  Component Value Date   WBC 15.2 (H) 05/21/2016   HGB 11.5 (L) 05/21/2016   HCT 35.6 (L) 05/21/2016   PLT 312.0 05/21/2016   GLUCOSE 138 (H) 05/21/2016   CHOL 187 05/21/2016   TRIG 142.0 05/21/2016   HDL 35.30 (L) 05/21/2016   LDLDIRECT 131.0 10/24/2015   LDLCALC 123 (H) 05/21/2016   ALT 20 05/21/2016   AST 17 05/21/2016   NA 135 05/21/2016   K 4.0 05/21/2016   CL 104 05/21/2016   CREATININE 1.03 05/21/2016   BUN 10 05/21/2016   CO2 28 05/21/2016   TSH 5.430 (H) 05/21/2016   HGBA1C 8.0 05/21/2016   MICROALBUR 2.0 (H) 10/24/2015    No results found.  Assessment & Plan:   Problem List Items Addressed This Visit    Flu-like symptoms    Rapid flu test was negative and symptoms have ben present for 2 weeks. Patient give am rx for Tamiflu for future exposure      Relevant Orders   POCT Influenza A/B (Completed)   CBC with Differential/Platelet (Completed)   Hyperlipidemia associated with type 2 diabetes mellitus ([place])   Relevant Orders   Lipid panel (Completed)   Hypertension   Relevant Orders   Comprehensive metabolic panel (Completed)    Hypothyroidism due to acquired atrophy of thyroid - Primary   Relevant Orders   T4 AND TSH (Completed)   Obesity (BMI 30-39.9)    I have addressed  BMI and recommended wt loss of 10% of body weigh over the next 6 months using a low glycemic index diet and regular exercise a minimum of 5 days per week. Trial of Saxenda advised       Relevant Medications   Liraglutide -Weight Management (SAXENDA) 18 MG/3ML SOPN   Type II diabetes mellitus, uncontrolled ([place])    Adherence to insulin use has been less than 100%. She is following a low carb diet but not exercising.  She will need to submit blood sugar readings for dose adjustment .      Lab Results  Component Value Date   HGBA1C 8.0 05/21/2016   Lab Results  Component Value Date   MICROALBUR 2.0 (H) 10/24/2015         Relevant Orders   POCT HgB A1C (Completed)   Vitamin B12 deficiency anemia due to intrinsic factor deficiency   Relevant Orders   B12 (Completed)   Vitamin D deficiency   Relevant Orders   VITAMIN D 25 Hydroxy (Vit-D Deficiency, Fractures) (Completed)    Other Visit Diagnoses    Class 2 obesity due to excess calories with serious comorbidity and body mass index (BMI) of 37.0 to 37.9 in adult       Relevant Medications   Liraglutide -Weight Management (SAXENDA) 18 MG/3ML SOPN      I have discontinued Ms. Inman phentermine and amoxicillin-clavulanate. I am also having her start on oseltamivir and Liraglutide -Weight Management. Additionally, I am having her maintain her Alum & Mag Hydroxide-Simeth (MAALOX ADVANCED PO), b complex vitamins, ferrous fumarate, albuterol, ONE TOUCH ULTRA SYSTEM KIT, onetouch ultrasoft, SYRINGE 3CC/25GX1", spironolactone, montelukast, nystatin-triamcinolone ointment, nystatin, ALPRAZolam, B-D UF III MINI PEN NEEDLES, levothyroxine, famotidine, metoprolol succinate, NOVOLOG MIX 70/30 FLEXPEN, nystatin, PROAIR HFA, ADVAIR DISKUS, and ONETOUCH VERIO.  Meds ordered this encounter  Medications   . oseltamivir (TAMIFLU) 75 MG capsule  Sig: Take 1 capsule (75 mg total) by mouth daily.    Dispense:  10 capsule    Refill:  0  . Liraglutide -Weight Management (SAXENDA) 18 MG/3ML SOPN    Sig: Inject 0.6 mg into the skin daily. Increase dose weekly as follows: Week 2: 1.2 mg daily ; Week 3: 1.8 mg daily; Week 4: 2.4 mg daily    Dispense:  9 mL    Refill:  0    Medications Discontinued During This Encounter  Medication Reason  . amoxicillin-clavulanate (AUGMENTIN) 875-125 MG tablet Completed Course  . phentermine (ADIPEX-P) 37.5 MG tablet     Follow-up: No Follow-up on file.   Crecencio Mc, MD

## 2016-05-21 NOTE — Progress Notes (Signed)
Pre visit review using our clinic review tool, if applicable. No additional management support is needed unless otherwise documented below in the visit note. 

## 2016-05-22 DIAGNOSIS — R6889 Other general symptoms and signs: Secondary | ICD-10-CM | POA: Insufficient documentation

## 2016-05-22 LAB — CBC WITH DIFFERENTIAL/PLATELET
BASOS PCT: 0.6 % (ref 0.0–3.0)
Basophils Absolute: 0.1 10*3/uL (ref 0.0–0.1)
Eosinophils Absolute: 0.3 10*3/uL (ref 0.0–0.7)
Eosinophils Relative: 1.7 % (ref 0.0–5.0)
HCT: 35.6 % — ABNORMAL LOW (ref 36.0–46.0)
Hemoglobin: 11.5 g/dL — ABNORMAL LOW (ref 12.0–15.0)
LYMPHS ABS: 2.5 10*3/uL (ref 0.7–4.0)
Lymphocytes Relative: 16.8 % (ref 12.0–46.0)
MCHC: 32.2 g/dL (ref 30.0–36.0)
MCV: 87.9 fl (ref 78.0–100.0)
MONO ABS: 1.1 10*3/uL — AB (ref 0.1–1.0)
Monocytes Relative: 7.3 % (ref 3.0–12.0)
NEUTROS PCT: 73.6 % (ref 43.0–77.0)
Neutro Abs: 11.2 10*3/uL — ABNORMAL HIGH (ref 1.4–7.7)
Platelets: 312 10*3/uL (ref 150.0–400.0)
RBC: 4.06 Mil/uL (ref 3.87–5.11)
RDW: 14.7 % (ref 11.5–15.5)
WBC: 15.2 10*3/uL — ABNORMAL HIGH (ref 4.0–10.5)

## 2016-05-22 LAB — COMPREHENSIVE METABOLIC PANEL
ALK PHOS: 122 U/L — AB (ref 39–117)
ALT: 20 U/L (ref 0–35)
AST: 17 U/L (ref 0–37)
Albumin: 4.2 g/dL (ref 3.5–5.2)
BUN: 10 mg/dL (ref 6–23)
CO2: 28 mEq/L (ref 19–32)
Calcium: 9.5 mg/dL (ref 8.4–10.5)
Chloride: 104 mEq/L (ref 96–112)
Creatinine, Ser: 1.03 mg/dL (ref 0.40–1.20)
GFR: 73.71 mL/min (ref >60.00)
GLUCOSE: 138 mg/dL — AB (ref 70–99)
Potassium: 4 mEq/L (ref 3.5–5.1)
SODIUM: 135 meq/L (ref 135–145)
TOTAL PROTEIN: 7.3 g/dL (ref 6.0–8.3)
Total Bilirubin: 0.2 mg/dL (ref 0.2–1.2)

## 2016-05-22 LAB — LIPID PANEL
CHOLESTEROL: 187 mg/dL (ref 0–200)
HDL: 35.3 mg/dL — AB (ref >39.00)
LDL CALC: 123 mg/dL — AB (ref 0–99)
NonHDL: 151.37
TRIGLYCERIDES: 142 mg/dL (ref 0.0–149.0)
Total CHOL/HDL Ratio: 5
VLDL: 28.4 mg/dL (ref 0.0–40.0)

## 2016-05-22 LAB — T4 AND TSH
T4, Total: 9.1 ug/dL (ref 4.5–12.0)
TSH: 5.43 u[IU]/mL — ABNORMAL HIGH (ref 0.450–4.500)

## 2016-05-22 LAB — VITAMIN D 25 HYDROXY (VIT D DEFICIENCY, FRACTURES): VITD: 37.32 ng/mL (ref 30.00–100.00)

## 2016-05-22 LAB — VITAMIN B12: Vitamin B-12: 291 pg/mL (ref 211–911)

## 2016-05-22 MED ORDER — LEVOTHYROXINE SODIUM 200 MCG PO TABS: 200.0000 ug | ORAL_TABLET | Freq: Every day | ORAL | 0 refills | Status: DC

## 2016-05-22 NOTE — Assessment & Plan Note (Signed)
I have addressed  BMI and recommended wt loss of 10% of body weigh over the next 6 months using a low glycemic index diet and regular exercise a minimum of 5 days per week. Trial of Saxenda advised

## 2016-05-22 NOTE — Assessment & Plan Note (Signed)
Rapid flu test was negative and symptoms have ben present for 2 weeks. Patient give am rx for Tamiflu for future exposure

## 2016-05-22 NOTE — Assessment & Plan Note (Addendum)
Adherence to insulin use has been less than 100%. She is following a low carb diet but not exercising.  She will need to submit blood sugar readings for dose adjustment .      Lab Results  Component Value Date   HGBA1C 8.0 05/21/2016   Lab Results  Component Value Date   MICROALBUR 2.0 (H) 10/24/2015

## 2016-05-22 NOTE — Assessment & Plan Note (Signed)
.  Patient's thyroid function is underactive on current levothyroxine dose of 175 mcg daily.  Principles of proper administration reviewed; patient has not missed more than one dose in the last 6 weeks and is taking the medication alone on an empty stomach.  Will increase dose to 200 mcg daily and repeat TSH in 6 weeks.

## 2016-05-23 ENCOUNTER — Encounter: Payer: Self-pay | Admitting: Internal Medicine

## 2016-05-24 ENCOUNTER — Telehealth: Payer: Self-pay | Admitting: Internal Medicine

## 2016-05-24 NOTE — Telephone Encounter (Signed)
PA for saxenda started on cover my meds

## 2016-05-25 ENCOUNTER — Ambulatory Visit: Payer: BC Managed Care – PPO | Admitting: Internal Medicine

## 2016-05-28 ENCOUNTER — Encounter: Payer: Self-pay | Admitting: Internal Medicine

## 2016-05-29 MED ORDER — ERYTHROMYCIN 5 MG/GM OP OINT: 1.0000 "application " | TOPICAL_OINTMENT | Freq: Three times a day (TID) | OPHTHALMIC | 0 refills | Status: DC

## 2016-06-03 ENCOUNTER — Other Ambulatory Visit: Payer: Self-pay | Admitting: Internal Medicine

## 2016-06-14 ENCOUNTER — Other Ambulatory Visit: Payer: Self-pay | Admitting: Internal Medicine

## 2016-06-15 ENCOUNTER — Other Ambulatory Visit: Payer: Self-pay

## 2016-06-15 NOTE — Telephone Encounter (Signed)
Xanax refilled on 11/07/2015 with 4 refills. Last office visit 05/22/2015. Next office visit 08/21/2016.

## 2016-06-15 NOTE — Telephone Encounter (Signed)
Check your years when you are giving me last OV.  Could mean the difference between yes and no !  Last OV 2018   meds refilled

## 2016-06-15 NOTE — Telephone Encounter (Signed)
Rx printed/signed/ faxed to [facility].    Sherlene Shamseresa L Tullo, MD  12:57 PM  Note    Check your years when you are giving me last OV.  Could mean the difference between yes and no !  Last OV 2018   meds refilled          10:14 AM  You routed this conversation to Sherlene Shamseresa L Tullo, MD  Me     10:12 AM  Note    Xanax refilled on 11/07/2015 with 4 refills. Last office visit 05/22/2015. Next office visit 08/21/2016.

## 2016-07-15 ENCOUNTER — Other Ambulatory Visit: Payer: Self-pay | Admitting: Internal Medicine

## 2016-08-13 ENCOUNTER — Other Ambulatory Visit: Payer: Self-pay | Admitting: Internal Medicine

## 2016-08-15 ENCOUNTER — Other Ambulatory Visit: Payer: Self-pay | Admitting: Internal Medicine

## 2016-08-21 ENCOUNTER — Ambulatory Visit (INDEPENDENT_AMBULATORY_CARE_PROVIDER_SITE_OTHER): Payer: BC Managed Care – PPO | Admitting: Internal Medicine

## 2016-08-21 ENCOUNTER — Encounter: Payer: Self-pay | Admitting: Internal Medicine

## 2016-08-21 VITALS — BP 110/60 | HR 81 | Temp 98.9°F | Resp 15 | Ht 68.0 in | Wt 241.4 lb

## 2016-08-21 DIAGNOSIS — Z1231 Encounter for screening mammogram for malignant neoplasm of breast: Secondary | ICD-10-CM

## 2016-08-21 DIAGNOSIS — E034 Atrophy of thyroid (acquired): Secondary | ICD-10-CM | POA: Diagnosis not present

## 2016-08-21 DIAGNOSIS — E559 Vitamin D deficiency, unspecified: Secondary | ICD-10-CM

## 2016-08-21 DIAGNOSIS — Z794 Long term (current) use of insulin: Secondary | ICD-10-CM

## 2016-08-21 DIAGNOSIS — I10 Essential (primary) hypertension: Secondary | ICD-10-CM | POA: Diagnosis not present

## 2016-08-21 DIAGNOSIS — Z1239 Encounter for other screening for malignant neoplasm of breast: Secondary | ICD-10-CM

## 2016-08-21 DIAGNOSIS — E1100 Type 2 diabetes mellitus with hyperosmolarity without nonketotic hyperglycemic-hyperosmolar coma (NKHHC): Secondary | ICD-10-CM

## 2016-08-21 LAB — POCT GLYCOSYLATED HEMOGLOBIN (HGB A1C): Hemoglobin A1C: 8.2

## 2016-08-21 MED ORDER — FAMOTIDINE 20 MG PO TABS: ORAL_TABLET | ORAL | 0 refills | Status: DC

## 2016-08-21 NOTE — Patient Instructions (Addendum)
Limit yourself to 40 net carbs daily  DO NOT SKIP MEALS .  USE KIND BARS AND POWER CRUNCH AS YOUR COOKIE SNACK  INCREASE YOUR EVENING OF INSULIN  TO 26 UNITS .    CHECK SUGAR 2 HRS  AFTER DINNER   CALL NICHELLE !!! SHE CAN HELP YOU GET EXCITED ABOUT EXERCISE

## 2016-08-21 NOTE — Progress Notes (Signed)
Subjective:  Patient ID: Madeline Strickland, female    DOB: 27-Sep-1970  Age: 46 y.o. MRN: [id number]  CC: The primary encounter diagnosis was Screening breast examination. Diagnoses of Uncontrolled type 2 diabetes mellitus with hyperosmolarity without coma, with long-term current use of insulin ([place]), Vitamin D deficiency, Hypothyroidism due to acquired atrophy of thyroid, and Essential hypertension were also pertinent to this visit.  HPI Madeline Strickland presents for 3 month follow up on diabetes complicated by obesity , OSA ad hyperlipidemia. .  Patient has no complaints today.  Patient is trying to follow a low glycemic index diet but skipping meals (breakfast) and often eats a heavy dinner and taking all prescribed medications regularly without side effects.  Fasting sugars have been  Averaging 120 to 190.    Post prandial dinners are averaging 200 . Marland Kitchenaverages a 10 to 12 hour day of work,  Including meetings .  Eats fast food for dinner 3 out of 7 nights. Works 80 hours per week..  Eats a Fast food biscuit for breakfast every other day  Madeline Strickland other days,  Spends Saturday sleepign until 3 pm .    Not exercising.   Tried saxenda. Made her GERD worse  But she started at the 1.2 mg . Tried phentermine.     Not intentionally trying to lose weight .  Patient has had an eye exam in the last 12 months and checks feet regularly for signs of infection.  Patient does not walk barefoot outside,  And denies an numbness tingling or burning in feet. Patient is up to date on all recommended vaccinations  Obesity:  Lost 3 lbs since Jan visit (net)   8 lbs by her scales.  Not exercising   Getting hand spasms and tingling in hands   novolog 70/30   12 to 18 units in the morning and 20 to 26 units in the the evening .  T  Outpatient Medications Prior to Visit  Medication Sig Dispense Refill  . ADVAIR DISKUS 250-50 MCG/DOSE AEPB INHALE 1 PUFF INTO THE LUNGS TWICE DAILY 1 each 5  . ALPRAZolam (XANAX) 0.5  MG tablet TAKE 1 TABLET BY MOUTH TWICE DAILY AS NEEDED FOR ANXIETY OR INSOMNIA 60 tablet 5  . Alum & Mag Hydroxide-Simeth (MAALOX ADVANCED PO) Take by mouth at bedtime.      Marland Kitchen b complex vitamins tablet Take 1 tablet by mouth daily.      . B-D UF III MINI PEN NEEDLES 31G X 5 MM MISC USE AS DIRECTED THREE TIMES DAILY 100 each 3  . Blood Glucose Monitoring Suppl (ONE TOUCH ULTRA SYSTEM KIT) W/DEVICE KIT 1 kit by Does not apply route once. 1 each 0  . Lancets (ONETOUCH ULTRASOFT) lancets Use as instructed 100 each 12  . metoprolol succinate (TOPROL-XL) 50 MG 24 hr tablet TAKE 1 TABLET BY MOUTH EVERY DAY 30 tablet 3  . metoprolol succinate (TOPROL-XL) 50 MG 24 hr tablet TAKE 1 TABLET BY MOUTH EVERY DAY 30 tablet 0  . montelukast (SINGULAIR) 10 MG tablet TAKE 1 TABLET BY MOUTH AT BEDTIME 90 tablet 2  . NOVOLOG MIX 70/30 FLEXPEN (70-30) 100 UNIT/ML FlexPen INJECT 15 UNITS UNDER THE SKIN IN THE MORNING AND 25 UNITS IN THE EVENING BEFORE MEALS 15 mL 5  . nystatin (MYCOSTATIN) powder Apply topically 2 (two) times daily. on affected areas. 56.7 g 3  . nystatin-triamcinolone ointment (MYCOLOG) Apply 1 application topically 2 (two) times daily. 30 g 0  .  ONETOUCH VERIO test strip USE TO CHECK SUGAR BEFORE MEALS 100 each 1  . PROAIR HFA 108 (90 Base) MCG/ACT inhaler INHALE 2 PUFFS INTO THE LUNGS EVERY 6 HOURS AS NEEDED FOR WHEEZING OR SHORTNESS OF BREATH. 8.5 g 0  . spironolactone (ALDACTONE) 100 MG tablet TAKE 1 TABLET BY MOUTH TWICE DAILY 180 tablet 0  . Syringe/Needle, Disp, (SYRINGE 3CC/25GX1") 25G X 1" 3 ML MISC Use as directed 15 each 0  . levothyroxine (SYNTHROID, LEVOTHROID) 200 MCG tablet TAKE 1 TABLET(200 MCG) BY MOUTH DAILY BEFORE BREAKFAST 90 tablet 0  . albuterol (ACCUNEB) 1.25 MG/3ML nebulizer solution Take 3 mLs (1.25 mg total) by nebulization every 6 (six) hours as needed for wheezing. 75 mL 12  . ferrous fumarate (HEMOCYTE - 106 MG FE) 325 (106 FE) MG TABS Take 1 tablet by mouth.      .  Liraglutide -Weight Management (SAXENDA) 18 MG/3ML SOPN Inject 0.6 mg into the skin daily. Increase dose weekly as follows: Week 2: 1.2 mg daily ; Week 3: 1.8 mg daily; Week 4: 2.4 mg daily (Patient not taking: Reported on 08/21/2016) 9 mL 0  . erythromycin ophthalmic ointment Place 1 application into the right eye 3 (three) times daily. (Patient not taking: Reported on 08/21/2016) 3.5 g 0  . famotidine (PEPCID) 20 MG tablet TAKE 1 TABLET(20 MG) BY MOUTH TWICE DAILY 60 tablet 0  . metoprolol succinate (TOPROL-XL) 50 MG 24 hr tablet TAKE 1 TABLET BY MOUTH EVERY DAY (Patient not taking: Reported on 08/21/2016) 30 tablet 0  . montelukast (SINGULAIR) 10 MG tablet TAKE 1 TABLET BY MOUTH AT BEDTIME (Patient not taking: Reported on 08/21/2016) 90 tablet 3  . nystatin (MYCOSTATIN) 100000 UNIT/ML suspension Take 5 mLs (500,000 Units total) by mouth 4 (four) times daily. (Patient not taking: Reported on 08/21/2016) 120 mL 0  . oseltamivir (TAMIFLU) 75 MG capsule Take 1 capsule (75 mg total) by mouth daily. (Patient not taking: Reported on 08/21/2016) 10 capsule 0  . spironolactone (ALDACTONE) 100 MG tablet TAKE 1 TABLET BY MOUTH TWICE DAILY (Patient not taking: Reported on 08/21/2016) 180 tablet 2   No facility-administered medications prior to visit.     Review of Systems;  Patient denies headache, fevers, malaise, unintentional weight loss, skin rash, eye pain, sinus congestion and sinus pain, sore throat, dysphagia,  hemoptysis , cough, dyspnea, wheezing, chest pain, palpitations, orthopnea, edema, abdominal pain, nausea, melena, diarrhea, constipation, flank pain, dysuria, hematuria, urinary  Frequency, nocturia, numbness, tingling, seizures,  Focal weakness, Loss of consciousness,  Tremor, insomnia, depression, anxiety, and suicidal ideation.      Objective:  BP 110/60 (BP Location: Left Arm, Patient Position: Sitting, Cuff Size: Large)   Pulse 81   Temp 98.9 F (37.2 C) (Oral)   Resp 15   Ht _0  (1.727 m)    Wt 241 lb 6.4 oz (109.5 kg)   SpO2 98%   BMI 36.70 kg/m   BP Readings from Last 3 Encounters:  08/21/16 110/60  05/21/16 118/72  12/05/15 132/87    Wt Readings from Last 3 Encounters:  08/21/16 241 lb 6.4 oz (109.5 kg)  05/21/16 244 lb (110.7 kg)  12/05/15 242 lb (109.8 kg)    General appearance: alert, cooperative and appears stated age Ears: normal TM's and external ear canals both ears Throat: lips, mucosa, and tongue normal; teeth and gums normal Neck: no adenopathy, no carotid bruit, supple, symmetrical, trachea midline and thyroid not enlarged, symmetric, no tenderness/mass/nodules Back: symmetric, no curvature. ROM normal.  No CVA tenderness. Lungs: clear to auscultation bilaterally Heart: regular rate and rhythm, S1, S2 normal, no murmur, click, rub or gallop Abdomen: soft, non-tender; bowel sounds normal; no masses,  no organomegaly Pulses: 2+ and symmetric Skin: Skin color, texture, turgor normal. No rashes or lesions Lymph nodes: Cervical, supraclavicular, and axillary nodes normal.  Lab Results  Component Value Date   HGBA1C 8.2 08/21/2016   HGBA1C 8.0 05/21/2016   HGBA1C 7.7 (H) 10/24/2015    Lab Results  Component Value Date   CREATININE 1.03 05/21/2016   CREATININE 1.17 10/24/2015   CREATININE 1.18 07/04/2015    Lab Results  Component Value Date   WBC 15.2 (H) 05/21/2016   HGB 11.5 (L) 05/21/2016   HCT 35.6 (L) 05/21/2016   PLT 312.0 05/21/2016   GLUCOSE 138 (H) 05/21/2016   CHOL 187 05/21/2016   TRIG 142.0 05/21/2016   HDL 35.30 (L) 05/21/2016   LDLDIRECT 131.0 10/24/2015   LDLCALC 123 (H) 05/21/2016   ALT 20 05/21/2016   AST 17 05/21/2016   NA 135 05/21/2016   K 4.0 05/21/2016   CL 104 05/21/2016   CREATININE 1.03 05/21/2016   BUN 10 05/21/2016   CO2 28 05/21/2016   TSH 5.430 (H) 05/21/2016   HGBA1C 8.2 08/21/2016   MICROALBUR 2.0 (H) 10/24/2015    No results found.  Assessment & Plan:   Problem List Items Addressed This Visit      Hypertension    Well controlled on current regimen. Renal function stable, no changes today.  Lab Results  Component Value Date   CREATININE 1.03 05/21/2016   Lab Results  Component Value Date   NA 135 05/21/2016   K 4.0 05/21/2016   CL 104 05/21/2016   CO2 28 05/21/2016         Hypothyroidism due to acquired atrophy of thyroid    .Patient's thyroid function remains  underactive on current levothyroxine dose of 200  mcg daily.  Principles of proper administration reviewed; patient has not missed more than one dose in the last 6 weeks and is taking the medication alone on an empty stomach.  Will increase dose to 225 mcg daily and repeat TSH in 6 weeks.   Lab Results  Component Value Date   TSH 5.430 (H) 05/21/2016         Relevant Medications   levothyroxine (SYNTHROID, LEVOTHROID) 25 MCG tablet   levothyroxine (SYNTHROID, LEVOTHROID) 200 MCG tablet   Other Relevant Orders   TSH   T4   Type II diabetes mellitus, uncontrolled ([place])    Secondary to dietary noncompliance and worsening insulin resistance due to obesity and sedentary lifestyle.  Increase evenign dose of insulin to 26 units.  She is not following a low carb diet and  not exercising.  She will need to submit blood sugar readings for dose adjustments .      Lab Results  Component Value Date   HGBA1C 8.2 08/21/2016   Lab Results  Component Value Date   MICROALBUR 2.0 (H) 10/24/2015         Relevant Orders   POCT HgB A1C (Completed)   Comp Met (CMET)   Vitamin D deficiency    Now on supplementation with improvement in leg pain.       Relevant Orders   Vitamin D (25 hydroxy)    Other Visit Diagnoses    Screening breast examination    -  Primary   Relevant Orders   MM SCREENING BREAST TOMO BILATERAL  A total of 25 minutes of face to face time was spent with patient more than half of which was spent in counselling about the above mentioned conditions  and coordination of care    I have  discontinued Ms. Fogleman oseltamivir and erythromycin. I have also changed her levothyroxine and levothyroxine. Additionally, I am having her maintain her Alum & Mag Hydroxide-Simeth (MAALOX ADVANCED PO), b complex vitamins, ferrous fumarate, albuterol, ONE TOUCH ULTRA SYSTEM KIT, onetouch ultrasoft, SYRINGE 3CC/25GX1", spironolactone, nystatin-triamcinolone ointment, nystatin, B-D UF III MINI PEN NEEDLES, metoprolol succinate, NOVOLOG MIX 70/30 FLEXPEN, PROAIR HFA, ONETOUCH VERIO, Liraglutide -Weight Management, montelukast, ALPRAZolam, ADVAIR DISKUS, metoprolol succinate, and famotidine.  Meds ordered this encounter  Medications  . famotidine (PEPCID) 20 MG tablet    Sig: TAKE 1 TABLET(20 MG) BY MOUTH TWICE DAILY    Dispense:  60 tablet    Refill:  0  . levothyroxine (SYNTHROID, LEVOTHROID) 25 MCG tablet    Sig: Take 1 tablet (25 mcg total) by mouth daily before breakfast. With 200 mcg .  Total dose 225 mcg daily    Dispense:  90 tablet    Refill:  0  . levothyroxine (SYNTHROID, LEVOTHROID) 200 MCG tablet    Sig: Take 1 tablet (200 mcg total) by mouth daily before breakfast. Take with 25 mcg,  Total dose 225 mcg    Dispense:  90 tablet    Refill:  0    Medications Discontinued During This Encounter  Medication Reason  . famotidine (PEPCID) 20 MG tablet Reorder  . erythromycin ophthalmic ointment Therapy completed  . metoprolol succinate (TOPROL-XL) 50 MG 24 hr tablet Duplicate  . montelukast (SINGULAIR) 10 MG tablet Duplicate  . nystatin (MYCOSTATIN) 100000 UNIT/ML suspension Patient has not taken in last 30 days  . oseltamivir (TAMIFLU) 75 MG capsule Therapy completed  . spironolactone (ALDACTONE) 721 MG tablet Duplicate  . levothyroxine (SYNTHROID, LEVOTHROID) 200 MCG tablet Reorder    Follow-up: Return in about 3 months (around 11/21/2016).   Crecencio Mc, MD

## 2016-08-21 NOTE — Progress Notes (Signed)
Pre visit review using our clinic review tool, if applicable. No additional management support is needed unless otherwise documented below in the visit note. 

## 2016-08-22 MED ORDER — LEVOTHYROXINE SODIUM 25 MCG PO TABS: 25.0000 ug | ORAL_TABLET | Freq: Every day | ORAL | 0 refills | Status: DC

## 2016-08-22 MED ORDER — LEVOTHYROXINE SODIUM 200 MCG PO TABS: 200.0000 ug | ORAL_TABLET | Freq: Every day | ORAL | 0 refills | Status: DC

## 2016-08-22 NOTE — Assessment & Plan Note (Signed)
.  Patient's thyroid function remains  underactive on current levothyroxine dose of 200  mcg daily.  Principles of proper administration reviewed; patient has not missed more than one dose in the last 6 weeks and is taking the medication alone on an empty stomach.  Will increase dose to 225 mcg daily and repeat TSH in 6 weeks.   Lab Results  Component Value Date   TSH 5.430 (H) 05/21/2016

## 2016-08-22 NOTE — Assessment & Plan Note (Addendum)
Secondary to dietary noncompliance and worsening insulin resistance due to obesity and sedentary lifestyle.  Increase evenign dose of insulin to 26 units.  She is not following a low carb diet and  not exercising.  She will need to submit blood sugar readings for dose adjustments .      Lab Results  Component Value Date   HGBA1C 8.2 08/21/2016   Lab Results  Component Value Date   MICROALBUR 2.0 (H) 10/24/2015

## 2016-08-22 NOTE — Assessment & Plan Note (Signed)
Well controlled on current regimen. Renal function stable, no changes today.  Lab Results  Component Value Date   CREATININE 1.03 05/21/2016   Lab Results  Component Value Date   NA 135 05/21/2016   K 4.0 05/21/2016   CL 104 05/21/2016   CO2 28 05/21/2016

## 2016-08-22 NOTE — Assessment & Plan Note (Signed)
Now on supplementation with improvement in leg pain.  

## 2016-08-23 ENCOUNTER — Other Ambulatory Visit (INDEPENDENT_AMBULATORY_CARE_PROVIDER_SITE_OTHER): Payer: BC Managed Care – PPO

## 2016-08-23 ENCOUNTER — Telehealth: Payer: Self-pay | Admitting: Radiology

## 2016-08-23 DIAGNOSIS — Z794 Long term (current) use of insulin: Secondary | ICD-10-CM | POA: Diagnosis not present

## 2016-08-23 DIAGNOSIS — E559 Vitamin D deficiency, unspecified: Secondary | ICD-10-CM | POA: Diagnosis not present

## 2016-08-23 DIAGNOSIS — E034 Atrophy of thyroid (acquired): Secondary | ICD-10-CM | POA: Diagnosis not present

## 2016-08-23 DIAGNOSIS — E1100 Type 2 diabetes mellitus with hyperosmolarity without nonketotic hyperglycemic-hyperosmolar coma (NKHHC): Secondary | ICD-10-CM | POA: Diagnosis not present

## 2016-08-23 LAB — COMPREHENSIVE METABOLIC PANEL
ALT: 21 U/L (ref 0–35)
AST: 17 U/L (ref 0–37)
Albumin: 4.3 g/dL (ref 3.5–5.2)
Alkaline Phosphatase: 114 U/L (ref 39–117)
BILIRUBIN TOTAL: 0.4 mg/dL (ref 0.2–1.2)
BUN: 13 mg/dL (ref 6–23)
CALCIUM: 9.6 mg/dL (ref 8.4–10.5)
CHLORIDE: 100 meq/L (ref 96–112)
CO2: 27 meq/L (ref 19–32)
Creatinine, Ser: 1.03 mg/dL (ref 0.40–1.20)
GFR: 73.63 mL/min (ref >60.00)
Glucose, Bld: 216 mg/dL — ABNORMAL HIGH (ref 70–99)
POTASSIUM: 4 meq/L (ref 3.5–5.1)
Sodium: 133 mEq/L — ABNORMAL LOW (ref 135–145)
Total Protein: 8 g/dL (ref 6.0–8.3)

## 2016-08-23 LAB — VITAMIN D 25 HYDROXY (VIT D DEFICIENCY, FRACTURES): VITD: 40.23 ng/mL (ref 30.00–100.00)

## 2016-08-23 LAB — TSH: TSH: 1.72 u[IU]/mL (ref 0.35–4.50)

## 2016-08-23 LAB — T4: T4, Total: 14.3 ug/dL — ABNORMAL HIGH (ref 4.5–12.0)

## 2016-08-23 NOTE — Telephone Encounter (Signed)
FYI. Pt came back in this morning for second attempt to get lab work. Was unsuccessful the first time on 08/21/2016. Advised pt on 08/21/2016 to schedule lab appt to come back in for labs and advised pt to drink a lot of water to help pump up her veins. Before drawing pt this morning, I asked pt if she had plenty of water to drink this morning and pt stated "No, I don't like water and I'm not drinking it." "I've had Gatorade and juice." Explained to pt that drinking lots of water would help hydrate her veins. Stuck pt two times and was unsuccessful. Pt would not allow right arm to be looked at to see if there were any veins to be used. Explained to pt I was only allowed to stick her twice and gave pt the option of going to another facility to have labs drawn. Pt refused and stated, "the older LPN here usually sticks me 5 or 6 times and goes above the scar tissue, go get her." Explained to pt that we are only allowed 2 sticks per pt. CMA was successful on the second stick and obtained blood for tests. Pt has been asked repeatedly to drink plenty of water to help with blood draws and refuses.

## 2016-08-23 NOTE — Telephone Encounter (Signed)
Ok ,  Thank you  For managing her with grace!  FYI: gatorade will accomplish the same purpose (actually better than water because it has a little sodium), Ok thank

## 2016-08-24 ENCOUNTER — Encounter: Payer: Self-pay | Admitting: Internal Medicine

## 2016-09-17 ENCOUNTER — Other Ambulatory Visit: Payer: Self-pay | Admitting: Internal Medicine

## 2016-10-29 ENCOUNTER — Ambulatory Visit
Admission: RE | Admit: 2016-10-29 | Discharge: 2016-10-29 | Disposition: A | Discharge status: Home / Self Care | Payer: BC Managed Care – PPO | Source: Ambulatory Visit | Attending: Internal Medicine | Admitting: Internal Medicine

## 2016-10-29 ENCOUNTER — Other Ambulatory Visit: Payer: Self-pay | Admitting: Internal Medicine

## 2016-10-29 DIAGNOSIS — R928 Other abnormal and inconclusive findings on diagnostic imaging of breast: Secondary | ICD-10-CM

## 2016-10-29 DIAGNOSIS — Z1231 Encounter for screening mammogram for malignant neoplasm of breast: Secondary | ICD-10-CM | POA: Insufficient documentation

## 2016-10-29 DIAGNOSIS — N6489 Other specified disorders of breast: Secondary | ICD-10-CM

## 2016-10-29 DIAGNOSIS — Z1239 Encounter for other screening for malignant neoplasm of breast: Secondary | ICD-10-CM

## 2016-10-30 ENCOUNTER — Encounter: Payer: Self-pay | Admitting: Internal Medicine

## 2016-11-04 ENCOUNTER — Other Ambulatory Visit: Payer: Self-pay | Admitting: Internal Medicine

## 2016-11-05 ENCOUNTER — Telehealth: Payer: Self-pay | Admitting: Internal Medicine

## 2016-11-05 NOTE — Telephone Encounter (Signed)
Please advise 

## 2016-11-05 NOTE — Telephone Encounter (Signed)
Per Lelon MastSamantha at [facility] needs just the US order signed please and thank you!

## 2016-11-05 NOTE — Telephone Encounter (Signed)
signed

## 2016-11-09 ENCOUNTER — Ambulatory Visit
Admission: RE | Admit: 2016-11-09 | Discharge: 2016-11-09 | Disposition: A | Discharge status: Home / Self Care | Payer: BC Managed Care – PPO | Source: Ambulatory Visit | Attending: Internal Medicine | Admitting: Internal Medicine

## 2016-11-09 DIAGNOSIS — N6321 Unspecified lump in the left breast, upper outer quadrant: Secondary | ICD-10-CM | POA: Insufficient documentation

## 2016-11-09 DIAGNOSIS — N6489 Other specified disorders of breast: Secondary | ICD-10-CM

## 2016-11-09 DIAGNOSIS — R928 Other abnormal and inconclusive findings on diagnostic imaging of breast: Secondary | ICD-10-CM

## 2016-11-13 ENCOUNTER — Other Ambulatory Visit: Payer: Self-pay | Admitting: Internal Medicine

## 2016-11-15 ENCOUNTER — Other Ambulatory Visit: Payer: Self-pay | Admitting: Internal Medicine

## 2016-12-11 ENCOUNTER — Other Ambulatory Visit: Payer: Self-pay | Admitting: Internal Medicine

## 2017-01-07 ENCOUNTER — Other Ambulatory Visit: Payer: Self-pay | Admitting: Internal Medicine

## 2017-01-25 ENCOUNTER — Ambulatory Visit (INDEPENDENT_AMBULATORY_CARE_PROVIDER_SITE_OTHER): Payer: Self-pay | Admitting: Podiatry

## 2017-01-25 ENCOUNTER — Ambulatory Visit: Payer: Self-pay

## 2017-01-25 VITALS — BP 130/80 | HR 82 | Temp 98.7°F | Resp 16

## 2017-01-25 DIAGNOSIS — M7752 Other enthesopathy of left foot: Secondary | ICD-10-CM

## 2017-01-25 DIAGNOSIS — M79672 Pain in left foot: Secondary | ICD-10-CM

## 2017-01-25 NOTE — Progress Notes (Signed)
   Subjective:    Patient ID: Madeline Strickland, female    DOB: 15-Jan-1971, 46 y.o.   MRN: [id number]  HPI    Review of Systems     Objective:   Physical Exam        Assessment & Plan:
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
## 2017-01-30 MED ORDER — BETAMETHASONE SOD PHOS & ACET 6 (3-3) MG/ML IJ SUSP: 3.0000 mg | Freq: Once | INTRAMUSCULAR | Status: AC

## 2017-02-10 ENCOUNTER — Other Ambulatory Visit: Payer: Self-pay | Admitting: Internal Medicine

## 2017-02-12 ENCOUNTER — Other Ambulatory Visit: Payer: Self-pay | Admitting: Internal Medicine

## 2017-03-19 ENCOUNTER — Other Ambulatory Visit: Payer: Self-pay | Admitting: Internal Medicine

## 2017-03-24 ENCOUNTER — Telehealth: Payer: BC Managed Care – PPO | Admitting: Family

## 2017-03-24 DIAGNOSIS — J019 Acute sinusitis, unspecified: Secondary | ICD-10-CM

## 2017-03-24 MED ORDER — AMOXICILLIN-POT CLAVULANATE 875-125 MG PO TABS: 1.0000 | ORAL_TABLET | Freq: Two times a day (BID) | ORAL | 0 refills | Status: DC

## 2017-03-24 NOTE — Progress Notes (Signed)

## 2017-04-04 ENCOUNTER — Encounter: Payer: Self-pay | Admitting: Internal Medicine

## 2017-04-05 ENCOUNTER — Other Ambulatory Visit: Payer: Self-pay | Admitting: Internal Medicine

## 2017-04-08 ENCOUNTER — Other Ambulatory Visit: Payer: Self-pay | Admitting: Internal Medicine

## 2017-04-08 MED ORDER — FLUCONAZOLE 150 MG PO TABS: 150.0000 mg | ORAL_TABLET | Freq: Every day | ORAL | 0 refills | Status: DC

## 2017-04-08 NOTE — Telephone Encounter (Signed)
Alprazolam Refill for 30 days only.  OFFICE VISIT NEEDED prior to any more refills

## 2017-04-08 NOTE — Telephone Encounter (Signed)
Refilled: 06/15/2016 Last OV: 08/21/2016 Next OV: not scheduled

## 2017-04-08 NOTE — Progress Notes (Signed)
Fluconazole sent to [facility]

## 2017-04-08 NOTE — Telephone Encounter (Signed)
LMTCB. Need to schedule pt a office visit with Dr. Darrick Huntsmanullo prior to anymore refills. P[facility] may speak with pt.

## 2017-04-18 ENCOUNTER — Other Ambulatory Visit: Payer: Self-pay | Admitting: Internal Medicine

## 2017-04-25 ENCOUNTER — Other Ambulatory Visit: Payer: Self-pay | Admitting: Internal Medicine

## 2017-04-30 NOTE — Telephone Encounter (Signed)
LMTCB. Need to schedule pt for a follow up appt with Dr. Darrick Huntsmanullo so that she can continue to refill her medication. PEC may speak with pt.

## 2017-05-12 ENCOUNTER — Other Ambulatory Visit: Payer: Self-pay | Admitting: Internal Medicine

## 2017-05-18 ENCOUNTER — Other Ambulatory Visit: Payer: Self-pay | Admitting: Internal Medicine

## 2017-05-20 ENCOUNTER — Encounter: Payer: Self-pay | Admitting: Internal Medicine

## 2017-05-20 DIAGNOSIS — R928 Other abnormal and inconclusive findings on diagnostic imaging of breast: Secondary | ICD-10-CM

## 2017-05-21 ENCOUNTER — Telehealth: Payer: Self-pay | Admitting: Internal Medicine

## 2017-05-21 ENCOUNTER — Other Ambulatory Visit: Payer: Self-pay | Admitting: Internal Medicine

## 2017-05-21 DIAGNOSIS — R928 Other abnormal and inconclusive findings on diagnostic imaging of breast: Secondary | ICD-10-CM

## 2017-05-21 NOTE — Telephone Encounter (Signed)
Orders have been changed. Can you sign off on them. Thank You.

## 2017-05-21 NOTE — Telephone Encounter (Signed)
Good morning per [facility] the order should read US left img [id number] and Diag TOMO img [phone]. Thank you!

## 2017-05-22 ENCOUNTER — Other Ambulatory Visit: Payer: Self-pay | Admitting: Internal Medicine

## 2017-05-22 DIAGNOSIS — E1169 Type 2 diabetes mellitus with other specified complication: Secondary | ICD-10-CM

## 2017-05-22 DIAGNOSIS — Z8639 Personal history of other endocrine, nutritional and metabolic disease: Secondary | ICD-10-CM

## 2017-05-22 DIAGNOSIS — E559 Vitamin D deficiency, unspecified: Secondary | ICD-10-CM

## 2017-05-22 DIAGNOSIS — E11649 Type 2 diabetes mellitus with hypoglycemia without coma: Secondary | ICD-10-CM

## 2017-05-22 DIAGNOSIS — D510 Vitamin B12 deficiency anemia due to intrinsic factor deficiency: Secondary | ICD-10-CM

## 2017-05-22 DIAGNOSIS — E785 Hyperlipidemia, unspecified: Principal | ICD-10-CM

## 2017-05-22 DIAGNOSIS — E034 Atrophy of thyroid (acquired): Secondary | ICD-10-CM

## 2017-05-27 LAB — HM DIABETES EYE EXAM

## 2017-05-29 NOTE — Telephone Encounter (Signed)
appt scheduled for 06/05/2017

## 2017-06-03 ENCOUNTER — Other Ambulatory Visit (INDEPENDENT_AMBULATORY_CARE_PROVIDER_SITE_OTHER): Payer: BC Managed Care – PPO

## 2017-06-03 DIAGNOSIS — E034 Atrophy of thyroid (acquired): Secondary | ICD-10-CM | POA: Diagnosis not present

## 2017-06-03 DIAGNOSIS — E1169 Type 2 diabetes mellitus with other specified complication: Secondary | ICD-10-CM | POA: Diagnosis not present

## 2017-06-03 DIAGNOSIS — E11649 Type 2 diabetes mellitus with hypoglycemia without coma: Secondary | ICD-10-CM

## 2017-06-03 DIAGNOSIS — E559 Vitamin D deficiency, unspecified: Secondary | ICD-10-CM | POA: Diagnosis not present

## 2017-06-03 DIAGNOSIS — E785 Hyperlipidemia, unspecified: Secondary | ICD-10-CM

## 2017-06-03 DIAGNOSIS — Z8639 Personal history of other endocrine, nutritional and metabolic disease: Secondary | ICD-10-CM

## 2017-06-03 LAB — LIPID PANEL
CHOLESTEROL: 162 mg/dL (ref 0–200)
HDL: 35 mg/dL — AB (ref >39.00)
LDL CALC: 108 mg/dL — AB (ref 0–99)
NonHDL: 127.28
TRIGLYCERIDES: 97 mg/dL (ref 0.0–149.0)
Total CHOL/HDL Ratio: 5
VLDL: 19.4 mg/dL (ref 0.0–40.0)

## 2017-06-03 LAB — MICROALBUMIN / CREATININE URINE RATIO
Creatinine,U: 112.8 mg/dL
Microalb Creat Ratio: 0.6 mg/g (ref 0.0–30.0)

## 2017-06-03 LAB — COMPREHENSIVE METABOLIC PANEL
ALBUMIN: 4.2 g/dL (ref 3.5–5.2)
ALT: 19 U/L (ref 0–35)
AST: 17 U/L (ref 0–37)
Alkaline Phosphatase: 120 U/L — ABNORMAL HIGH (ref 39–117)
BUN: 13 mg/dL (ref 6–23)
CHLORIDE: 102 meq/L (ref 96–112)
CO2: 26 mEq/L (ref 19–32)
Calcium: 9.5 mg/dL (ref 8.4–10.5)
Creatinine, Ser: 1.02 mg/dL (ref 0.40–1.20)
GFR: 74.22 mL/min (ref >60.00)
Glucose, Bld: 126 mg/dL — ABNORMAL HIGH (ref 70–99)
POTASSIUM: 4.3 meq/L (ref 3.5–5.1)
Sodium: 135 mEq/L (ref 135–145)
Total Bilirubin: 0.5 mg/dL (ref 0.2–1.2)
Total Protein: 8 g/dL (ref 6.0–8.3)

## 2017-06-03 LAB — VITAMIN D 25 HYDROXY (VIT D DEFICIENCY, FRACTURES): VITD: 69.94 ng/mL (ref 30.00–100.00)

## 2017-06-03 LAB — TSH: TSH: 0.04 u[IU]/mL — AB (ref 0.35–4.50)

## 2017-06-03 NOTE — Addendum Note (Signed)
Addended by: WIGGINS, Kymiah Araiza N on: 06/03/2017 08:11 AM   Modules accepted: Orders  

## 2017-06-04 LAB — CBC WITH DIFFERENTIAL/PLATELET
BASOS ABS: 44 {cells}/uL (ref 0–200)
Basophils Relative: 0.3 %
EOS ABS: 276 {cells}/uL (ref 15–500)
Eosinophils Relative: 1.9 %
HCT: 36.3 % (ref 35.0–45.0)
HEMOGLOBIN: 12.3 g/dL (ref 11.7–15.5)
Lymphs Abs: 2480 cells/uL (ref 850–3900)
MCH: 28 pg (ref 27.0–33.0)
MCHC: 33.9 g/dL (ref 32.0–36.0)
MCV: 82.5 fL (ref 80.0–100.0)
MONOS PCT: 5.5 %
MPV: 11.2 fL (ref 7.5–12.5)
NEUTROS ABS: 10904 {cells}/uL — AB (ref 1500–7800)
Neutrophils Relative %: 75.2 %
Platelets: 389 10*3/uL (ref 140–400)
RBC: 4.4 10*6/uL (ref 3.80–5.10)
RDW: 12.9 % (ref 11.0–15.0)
Total Lymphocyte: 17.1 %
WBC mixed population: 798 cells/uL (ref 200–950)
WBC: 14.5 10*3/uL — ABNORMAL HIGH (ref 3.8–10.8)

## 2017-06-04 LAB — HEMOGLOBIN A1C
Hgb A1c MFr Bld: 7.6 % of total Hgb — ABNORMAL HIGH (ref <5.7)
MEAN PLASMA GLUCOSE: 171 (calc)
eAG (mmol/L): 9.5 (calc)

## 2017-06-04 LAB — IRON,TIBC AND FERRITIN PANEL
%SAT: 25 % (calc) (ref 11–50)
FERRITIN: 28 ng/mL (ref 10–232)
Iron: 99 ug/dL (ref 40–190)
TIBC: 396 mcg/dL (calc) (ref 250–450)

## 2017-06-05 ENCOUNTER — Ambulatory Visit (INDEPENDENT_AMBULATORY_CARE_PROVIDER_SITE_OTHER): Payer: BC Managed Care – PPO | Admitting: Internal Medicine

## 2017-06-05 ENCOUNTER — Encounter: Payer: Self-pay | Admitting: Internal Medicine

## 2017-06-05 DIAGNOSIS — E669 Obesity, unspecified: Secondary | ICD-10-CM

## 2017-06-05 DIAGNOSIS — E034 Atrophy of thyroid (acquired): Secondary | ICD-10-CM

## 2017-06-05 DIAGNOSIS — I10 Essential (primary) hypertension: Secondary | ICD-10-CM

## 2017-06-05 DIAGNOSIS — E11649 Type 2 diabetes mellitus with hypoglycemia without coma: Secondary | ICD-10-CM

## 2017-06-05 DIAGNOSIS — D72828 Other elevated white blood cell count: Secondary | ICD-10-CM | POA: Diagnosis not present

## 2017-06-05 DIAGNOSIS — D72829 Elevated white blood cell count, unspecified: Secondary | ICD-10-CM | POA: Insufficient documentation

## 2017-06-05 NOTE — Assessment & Plan Note (Signed)
Chronic,  Worked  up over ten years ago by [employer]

## 2017-06-05 NOTE — Progress Notes (Signed)
Subjective:  Patient ID: Madeline Strickland, female    DOB: [birth year]  Age: 47 y.o. MRN: [id number]  CC: Diagnoses of Other elevated white blood cell (WBC) count, Obesity (BMI 30-39.9), Uncontrolled type 2 diabetes mellitus with hypoglycemia without coma ([place]), Essential hypertension, and Hypothyroidism due to acquired atrophy of thyroid were pertinent to this visit.  HPI EMMAGRACE Strickland presents for follow up on diabetes.  Patient  Was last seen in May 2018  Patient has  been  following a low glycemic index diet  But is taking all prescribed medications regularly without side not effects.  Fasting sugars have been under less than 130 most of the time but her  post prandials have been over 200 frequently and over 300  on rare occasions.  Using 70/30 only at night AFTER SHE EATS.  NONE FOR BS < 150 .  Average  Dose is 18 to 20 units..   Patient is not exercising but wants to  lose weight .  Patient has had an eye exam in the last 12 months and checks feet regularly for signs of infection.  Patient does not walk barefoot outside,  And denies an numbness tingling or burning in feet. Patient is up to date on all recommended vaccinations  Stress:  Husband has had recurrent DVT/VTE PE's . Morbidly obese,  Now unable to ambulate without assistance.Madeline Strickland nearly 400 lbs   Asthma:  Has been Using advair inconsistently   Wants to lose weight .  Bought a treadmill. Has a home gym  Lab Results  Component Value Date   HGBA1C 7.6 (H) 06/03/2017   Lab Results  Component Value Date   TSH 0.04 (L) 06/03/2017   Lab Results  Component Value Date   WBC 14.5 (H) 06/03/2017   HGB 12.3 06/03/2017   HCT 36.3 06/03/2017   MCV 82.5 06/03/2017   PLT 389 06/03/2017     Recent discovery of breast masses (2) in left breast July 2018.  6 month follow up recommended   Outpatient Medications Prior to Visit  Medication Sig Dispense Refill  . ADVAIR DISKUS 250-50 MCG/DOSE AEPB INHALE 1 PUFF INTO THE LUNGS TWICE DAILY 1  each 5  . ALPRAZolam (XANAX) 0.5 MG tablet TAKE 1 TABLET BY MOUTH TWICE DAILY AS NEEDED FOR ANXIETY OR INSOMNIA 60 tablet 0  . Alum & Mag Hydroxide-Simeth (MAALOX ADVANCED PO) Take by mouth at bedtime.      Marland Kitchen amoxicillin-clavulanate (AUGMENTIN) 875-125 MG tablet Take 1 tablet by mouth 2 (two) times daily. 14 tablet 0  . b complex vitamins tablet Take 1 tablet by mouth daily.      . B-D UF III MINI PEN NEEDLES 31G X 5 MM MISC USE AS DIRECTED THREE TIMES DAILY 100 each 0  . B-D UF III MINI PEN NEEDLES 31G X 5 MM MISC USE AS DIRECTED THREE TIMES DAILY 100 each 0  . Blood Glucose Monitoring Suppl (ONE TOUCH ULTRA SYSTEM KIT) W/DEVICE KIT 1 kit by Does not apply route once. 1 each 0  . famotidine (PEPCID) 20 MG tablet TAKE 1 TABLET(20 MG) BY MOUTH TWICE DAILY 60 tablet 0  . ferrous fumarate (HEMOCYTE - 106 MG FE) 325 (106 FE) MG TABS Take 1 tablet by mouth.      . fluconazole (DIFLUCAN) 150 MG tablet Take 1 tablet (150 mg total) by mouth daily. 2 tablet 0  . Lancets (ONETOUCH ULTRASOFT) lancets Use as instructed 100 each 12  . levothyroxine (SYNTHROID, LEVOTHROID) 200  MCG tablet Take 1 tablet (200 mcg total) by mouth daily before breakfast. Take with 25 mcg,  Total dose 225 mcg 90 tablet 0  . levothyroxine (SYNTHROID, LEVOTHROID) 200 MCG tablet TAKE 1 TABLET(200 MCG) BY MOUTH DAILY BEFORE BREAKFAST 90 tablet 0  . metoprolol succinate (TOPROL-XL) 50 MG 24 hr tablet TAKE 1 TABLET BY MOUTH EVERY DAY 30 tablet 5  . metoprolol succinate (TOPROL-XL) 50 MG 24 hr tablet TAKE 1 TABLET BY MOUTH EVERY DAY 30 tablet 0  . metoprolol succinate (TOPROL-XL) 50 MG 24 hr tablet TAKE 1 TABLET BY MOUTH EVERY DAY 30 tablet 0  . metoprolol succinate (TOPROL-XL) 50 MG 24 hr tablet TAKE 1 TABLET BY MOUTH EVERY DAY 30 tablet 5  . montelukast (SINGULAIR) 10 MG tablet TAKE 1 TABLET BY MOUTH AT BEDTIME 90 tablet 2  . montelukast (SINGULAIR) 10 MG tablet TAKE 1 TABLET BY MOUTH AT BEDTIME 90 tablet 0  . montelukast (SINGULAIR) 10  MG tablet TAKE 1 TABLET BY MOUTH AT BEDTIME 90 tablet 0  . NOVOLOG MIX 70/30 FLEXPEN (70-30) 100 UNIT/ML FlexPen INJECT 15 UNITS UNDER THE SKIN IN THE MORNING AND 25 UNITS IN THE EVENING BEFORE MEALS 15 mL 0  . NOVOLOG MIX 70/30 FLEXPEN (70-30) 100 UNIT/ML FlexPen INJECT 15 UNITS UNDER THE SKIN IN THE MORNING AND 25 UNITS IN THE EVENING BEFORE MEALS 15 mL 0  . nystatin (MYCOSTATIN) powder Apply topically 2 (two) times daily. on affected areas. 56.7 g 3  . nystatin-triamcinolone ointment (MYCOLOG) Apply 1 application topically 2 (two) times daily. 30 g 0  . ONETOUCH VERIO test strip TEST BEFORE MEALS AS DIRECTED. 100 each 0  . spironolactone (ALDACTONE) 100 MG tablet TAKE 1 TABLET BY MOUTH TWICE DAILY 180 tablet 0  . spironolactone (ALDACTONE) 100 MG tablet TAKE 1 TABLET BY MOUTH TWICE DAILY 180 tablet 0  . Syringe/Needle, Disp, (SYRINGE 3CC/25GX1") 25G X 1" 3 ML MISC Use as directed 15 each 0  . levothyroxine (SYNTHROID, LEVOTHROID) 25 MCG tablet TAKE 1 TABLET BY MOUTH EVERY DAY BEFORE BREAKFAST WITH 200MCG TABLETS FOR TOTAL DOSE OF 225MCG 90 tablet 0  . albuterol (ACCUNEB) 1.25 MG/3ML nebulizer solution Take 3 mLs (1.25 mg total) by nebulization every 6 (six) hours as needed for wheezing. 75 mL 12   Facility-Administered Medications Prior to Visit  Medication Dose Route Frequency Provider Last Rate Last Dose  . betamethasone acetate-betamethasone sodium phosphate (CELESTONE) injection 3 mg  3 mg Intramuscular Once Edrick Kins, DPM        Review of Systems;  Patient denies headache, fevers, malaise, unintentional weight loss, skin rash, eye pain, sinus congestion and sinus pain, sore throat, dysphagia,  hemoptysis , cough, dyspnea, wheezing, chest pain, palpitations, orthopnea, edema, abdominal pain, nausea, melena, diarrhea, constipation, flank pain, dysuria, hematuria, urinary  Frequency, nocturia, numbness, tingling, seizures,  Focal weakness, Loss of consciousness,  Tremor, insomnia,  depression, anxiety, and suicidal ideation.      Objective:  BP 108/66 (BP Location: Left Arm, Patient Position: Sitting, Cuff Size: Large)   Pulse 80   Temp 98.5 F (36.9 C) (Oral)   Resp 15   Ht 5' 8"  (1.727 m)   Wt 236 lb 6.4 oz (107.2 kg)   SpO2 98%   BMI 35.94 kg/m   BP Readings from Last 3 Encounters:  06/05/17 108/66  01/25/17 130/80  08/21/16 110/60    Wt Readings from Last 3 Encounters:  06/05/17 236 lb 6.4 oz (107.2 kg)  08/21/16 241 lb 6.4  oz (109.5 kg)  05/21/16 244 lb (110.7 kg)    General appearance: alert, cooperative and appears stated age Ears: normal TM's and external ear canals both ears Throat: lips, mucosa, and tongue normal; teeth and gums normal Neck: no adenopathy, no carotid bruit, supple, symmetrical, trachea midline and thyroid not enlarged, symmetric, no tenderness/mass/nodules Back: symmetric, no curvature. ROM normal. No CVA tenderness. Lungs: clear to auscultation bilaterally Heart: regular rate and rhythm, S1, S2 normal, no murmur, click, rub or gallop Abdomen: soft, non-tender; bowel sounds normal; no masses,  no organomegaly Pulses: 2+ and symmetric Skin: Skin color, texture, turgor normal. No rashes or lesions Lymph nodes: Cervical, supraclavicular, and axillary nodes normal.  Lab Results  Component Value Date   HGBA1C 7.6 (H) 06/03/2017   HGBA1C 8.2 08/21/2016   HGBA1C 8.0 05/21/2016    Lab Results  Component Value Date   CREATININE 1.02 06/03/2017   CREATININE 1.03 08/23/2016   CREATININE 1.03 05/21/2016    Lab Results  Component Value Date   WBC 14.5 (H) 06/03/2017   HGB 12.3 06/03/2017   HCT 36.3 06/03/2017   PLT 389 06/03/2017   GLUCOSE 126 (H) 06/03/2017   CHOL 162 06/03/2017   TRIG 97.0 06/03/2017   HDL 35.00 (L) 06/03/2017   LDLDIRECT 131.0 10/24/2015   LDLCALC 108 (H) 06/03/2017   ALT 19 06/03/2017   AST 17 06/03/2017   NA 135 06/03/2017   K 4.3 06/03/2017   CL 102 06/03/2017   CREATININE 1.02  06/03/2017   BUN 13 06/03/2017   CO2 26 06/03/2017   TSH 0.04 (L) 06/03/2017   HGBA1C 7.6 (H) 06/03/2017   MICROALBUR <0.7 06/03/2017    US Breast Ltd Uni Left Inc Axilla  Result Date: 11/09/2016 CLINICAL DATA:  Screening recall for the left breast asymmetry. EXAM: 2D DIGITAL DIAGNOSTIC UNILATERAL LEFT MAMMOGRAM WITH CAD AND ADJUNCT TOMO LEFT BREAST ULTRASOUND COMPARISON:  Screening mammogram dated 10/29/2016 ACR Breast Density Category b: There are scattered areas of fibroglandular density. FINDINGS: In the upper slightly inner quadrant of the left breast there is a lobulated mass measuring approximately 1 cm. Mammographic images were processed with CAD. Ultrasound targeted to the left breast at 12:30, 4 cm from the nipple demonstrates a hypoechoic circumscribed oval mass with macro lobulations measuring 1.2 x 0.4 x 0.7 cm. This is favored to represent a cluster of cysts. IMPRESSION: There is a probably benign mass in the left breast at 12:30. This is favored to represent a cluster of cysts. RECOMMENDATION: Six-month follow-up left breast mammogram and ultrasound is recommended. I have discussed the findings and recommendations with the patient. Results were also provided in writing at the conclusion of the visit. If applicable, a reminder letter will be sent to the patient regarding the next appointment. BI-RADS CATEGORY  3: Probably benign. Electronically Signed   By: Ammie Ferrier M.D.   On: 11/09/2016 14:31   Mm Diag Breast Tomo Uni Left  Result Date: 11/09/2016 CLINICAL DATA:  Screening recall for the left breast asymmetry. EXAM: 2D DIGITAL DIAGNOSTIC UNILATERAL LEFT MAMMOGRAM WITH CAD AND ADJUNCT TOMO LEFT BREAST ULTRASOUND COMPARISON:  Screening mammogram dated 10/29/2016 ACR Breast Density Category b: There are scattered areas of fibroglandular density. FINDINGS: In the upper slightly inner quadrant of the left breast there is a lobulated mass measuring approximately 1 cm. Mammographic  images were processed with CAD. Ultrasound targeted to the left breast at 12:30, 4 cm from the nipple demonstrates a hypoechoic circumscribed oval mass with macro lobulations measuring  1.2 x 0.4 x 0.7 cm. This is favored to represent a cluster of cysts. IMPRESSION: There is a probably benign mass in the left breast at 12:30. This is favored to represent a cluster of cysts. RECOMMENDATION: Six-month follow-up left breast mammogram and ultrasound is recommended. I have discussed the findings and recommendations with the patient. Results were also provided in writing at the conclusion of the visit. If applicable, a reminder letter will be sent to the patient regarding the next appointment. BI-RADS CATEGORY  3: Probably benign. Electronically Signed   By: Ammie Ferrier M.D.   On: 11/09/2016 14:31    Assessment & Plan:   Problem List Items Addressed This Visit    Obesity (BMI 30-39.9)    I have addressed  BMI again and recommended wt loss of 10% of body weigh over the next 6 months using a low glycemic index diet and regular exercise a minimum of 5 days per week. Does not meet my criteria for use of appetite suppressants.         Hypertension    Well controlled on current regimen. Renal function stable, no changes today.  Lab Results  Component Value Date   CREATININE 1.02 06/03/2017   Lab Results  Component Value Date   NA 135 06/03/2017   K 4.3 06/03/2017   CL 102 06/03/2017   CO2 26 06/03/2017         Hypothyroidism due to acquired atrophy of thyroid    Thyroid function is overactive on current dose of 225 mcg.  I have reduced dose to 200 mcg levothyroxine  Lab Results  Component Value Date   TSH 0.04 (L) 06/03/2017         Type II diabetes mellitus, uncontrolled ([place])    Improved but not at goal.  Advised to take her insulin before dinner.  Lab Results  Component Value Date   HGBA1C 7.6 (H) 06/03/2017   Lab Results  Component Value Date   MICROALBUR <0.7 06/03/2017           Leukocytosis    Chronic,  Worked  up over ten years ago by [facility]        A total of 25 minutes of face to face time was spent with patient more than half of which was spent in counselling about the above mentioned conditions  and coordination of care   I am having Aritha D. Suddeth maintain her Alum & Mag Hydroxide-Simeth (MAALOX ADVANCED PO), b complex vitamins, ferrous fumarate, albuterol, ONE TOUCH ULTRA SYSTEM KIT, onetouch ultrasoft, SYRINGE 3CC/25GX1", spironolactone, nystatin-triamcinolone ointment, nystatin, montelukast, ADVAIR DISKUS, levothyroxine, metoprolol succinate, B-D UF III MINI PEN NEEDLES, montelukast, B-D UF III MINI PEN NEEDLES, metoprolol succinate, ONETOUCH VERIO, spironolactone, amoxicillin-clavulanate, NOVOLOG MIX 70/30 FLEXPEN, ALPRAZolam, fluconazole, metoprolol succinate, NOVOLOG MIX 70/30 FLEXPEN, montelukast, levothyroxine, metoprolol succinate, and famotidine. We will continue to administer betamethasone acetate-betamethasone sodium phosphate.  No orders of the defined types were placed in this encounter.   Medications Discontinued During This Encounter  Medication Reason  . levothyroxine (SYNTHROID, LEVOTHROID) 25 MCG tablet     Follow-up: Return in about 3 months (around 09/02/2017) for follow up diabetes.   Crecencio Mc, MD

## 2017-06-05 NOTE — Patient Instructions (Addendum)
Your thyroid is overactive, Reduce your daily  thyroid dose to 200 mg .  You are taking your insulin too late. Take your evening insulin dose  Of  18 units  Up to 30  minutes  prior to your evening meal.  And check your sugar 2 hours after.   You do not need an appetite suppressant.  Just start exercising

## 2017-06-06 NOTE — Assessment & Plan Note (Signed)
I have addressed  BMI again and recommended wt loss of 10% of body weigh over the next 6 months using a low glycemic index diet and regular exercise a minimum of 5 days per week. Does not meet my criteria for use of appetite suppressants.

## 2017-06-06 NOTE — Assessment & Plan Note (Signed)
Well controlled on current regimen. Renal function stable, no changes today.  Lab Results  Component Value Date   CREATININE 1.02 06/03/2017   Lab Results  Component Value Date   NA 135 06/03/2017   K 4.3 06/03/2017   CL 102 06/03/2017   CO2 26 06/03/2017

## 2017-06-06 NOTE — Assessment & Plan Note (Signed)
Improved but not at goal.  Advised to take her insulin before dinner.  Lab Results  Component Value Date   HGBA1C 7.6 (H) 06/03/2017   Lab Results  Component Value Date   MICROALBUR <0.7 06/03/2017

## 2017-06-06 NOTE — Assessment & Plan Note (Addendum)
Thyroid function is overactive on current dose of 225 mcg.  I have reduced dose to 200 mcg levothyroxine  Lab Results  Component Value Date   TSH 0.04 (L) 06/03/2017

## 2017-06-12 ENCOUNTER — Ambulatory Visit
Admission: RE | Admit: 2017-06-12 | Discharge: 2017-06-12 | Disposition: A | Discharge status: Home / Self Care | Payer: BC Managed Care – PPO | Source: Ambulatory Visit | Attending: Internal Medicine | Admitting: Internal Medicine

## 2017-06-12 DIAGNOSIS — N6321 Unspecified lump in the left breast, upper outer quadrant: Secondary | ICD-10-CM | POA: Insufficient documentation

## 2017-06-12 DIAGNOSIS — R928 Other abnormal and inconclusive findings on diagnostic imaging of breast: Secondary | ICD-10-CM | POA: Diagnosis present

## 2017-06-12 DIAGNOSIS — N6489 Other specified disorders of breast: Secondary | ICD-10-CM | POA: Diagnosis present

## 2017-06-13 ENCOUNTER — Encounter: Payer: Self-pay | Admitting: *Deleted

## 2017-06-13 ENCOUNTER — Encounter: Payer: Self-pay | Admitting: Internal Medicine

## 2017-06-13 ENCOUNTER — Other Ambulatory Visit: Payer: Self-pay | Admitting: Internal Medicine

## 2017-06-13 DIAGNOSIS — N6320 Unspecified lump in the left breast, unspecified quadrant: Secondary | ICD-10-CM

## 2017-06-13 NOTE — Progress Notes (Unsigned)
amb  

## 2017-06-18 ENCOUNTER — Ambulatory Visit: Payer: BC Managed Care – PPO | Admitting: General Surgery

## 2017-06-18 ENCOUNTER — Other Ambulatory Visit: Payer: Self-pay | Admitting: Internal Medicine

## 2017-06-18 ENCOUNTER — Encounter: Payer: Self-pay | Admitting: General Surgery

## 2017-06-18 ENCOUNTER — Inpatient Hospital Stay: Payer: Self-pay

## 2017-06-18 VITALS — BP 128/74 | HR 88 | Resp 14 | Ht 69.0 in | Wt 233.0 lb

## 2017-06-18 DIAGNOSIS — N6321 Unspecified lump in the left breast, upper outer quadrant: Secondary | ICD-10-CM

## 2017-06-18 HISTORY — PX: BREAST BIOPSY: SHX20
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
## 2017-06-18 NOTE — Patient Instructions (Signed)

## 2017-06-19 ENCOUNTER — Telehealth: Payer: Self-pay | Admitting: General Surgery

## 2017-06-19 NOTE — Telephone Encounter (Signed)
The patient was notified that the biopsy of both sites were benign.  Fibrocystic in the area of what appeared to be multiple cysts at the 12 o'clock position and a small papilloma in the retroareolar tissue without atypia  I do not recommend excision for papillomas without atypia.  Patient reported moderate soreness relieved with Tylenol.  We will make arrangements for a postbiopsy mammogram in the next week or so to confirm that the larger lesion which prompted the ultrasound which prompted the biopsy indeed corresponds to the ultrasound finding.

## 2017-06-21 ENCOUNTER — Telehealth: Payer: Self-pay

## 2017-06-21 ENCOUNTER — Other Ambulatory Visit: Payer: Self-pay

## 2017-06-21 ENCOUNTER — Encounter: Payer: Self-pay | Admitting: Internal Medicine

## 2017-06-21 DIAGNOSIS — N6321 Unspecified lump in the left breast, upper outer quadrant: Secondary | ICD-10-CM

## 2017-06-21 NOTE — Telephone Encounter (Signed)
-----   Message from Earline MayotteJeffrey W Byrnett, MD sent at 06/19/2017  9:03 AM EST ----- Please arrange for a postbiopsy breast mammogram when convenient with the patient.  She is experiencing some soreness today, and will probably put off a week or so.

## 2017-06-21 NOTE — Telephone Encounter (Signed)
Spoke with patient about getting a post biopsy mammogram. She is scheduled for this on 07/09/17 at 8:00 am.

## 2017-06-26 ENCOUNTER — Encounter: Payer: Self-pay | Admitting: Internal Medicine

## 2017-06-26 ENCOUNTER — Other Ambulatory Visit: Payer: Self-pay | Admitting: Internal Medicine

## 2017-06-26 ENCOUNTER — Other Ambulatory Visit: Payer: Self-pay | Admitting: *Deleted

## 2017-06-26 MED ORDER — FLUTICASONE-SALMETEROL 250-50 MCG/DOSE IN AEPB: INHALATION_SPRAY | RESPIRATORY_TRACT | 5 refills | Status: DC

## 2017-06-26 NOTE — Telephone Encounter (Signed)
Refilled  Per  protocall

## 2017-06-26 NOTE — Telephone Encounter (Signed)
Copied from CRM [phone]. Topic: Quick Communication - Rx Refill/Question >> Jun 26, 2017 11:14 AM Arlyss Gandyichardson, Danijela Vessey N, NT wrote: Medication:  ADVAIR DISKUS 250-50 MCG/DOSE AEPB   Has the patient contacted their pharmacy? Yes.     (Agent: If no, request that the patient contact the pharmacy for the refill.)   Preferred Pharmacy (with phone number or street name): [facility] [address] [place]   Agent: Please be advised that RX refills may take up to 3 business days. We ask that you follow-up with your pharmacy.

## 2017-07-04 ENCOUNTER — Encounter: Payer: Self-pay | Admitting: Internal Medicine

## 2017-07-04 DIAGNOSIS — E669 Obesity, unspecified: Secondary | ICD-10-CM

## 2017-07-05 MED ORDER — PHENTERMINE HCL 37.5 MG PO TABS: 37.5000 mg | ORAL_TABLET | Freq: Every day | ORAL | 2 refills | Status: DC

## 2017-07-05 NOTE — Assessment & Plan Note (Signed)
  I have authorized the use of phentermine for 3 months  and a  return to see me in 3 months. Goal weight loss is 12 lbs by follow up

## 2017-07-05 NOTE — Telephone Encounter (Signed)
Printed, signed and faxed.  

## 2017-07-09 ENCOUNTER — Ambulatory Visit
Admission: RE | Admit: 2017-07-09 | Discharge: 2017-07-09 | Disposition: A | Discharge status: Home / Self Care | Payer: BC Managed Care – PPO | Source: Ambulatory Visit | Attending: General Surgery | Admitting: General Surgery

## 2017-07-09 ENCOUNTER — Encounter: Payer: Self-pay | Admitting: Radiology

## 2017-07-09 DIAGNOSIS — N6321 Unspecified lump in the left breast, upper outer quadrant: Secondary | ICD-10-CM | POA: Diagnosis present

## 2017-07-22 ENCOUNTER — Other Ambulatory Visit: Payer: Self-pay | Admitting: Internal Medicine

## 2017-07-23 ENCOUNTER — Other Ambulatory Visit: Payer: Self-pay

## 2017-07-23 MED ORDER — ALPRAZOLAM 0.5 MG PO TABS: ORAL_TABLET | ORAL | 5 refills | Status: DC

## 2017-07-23 NOTE — Telephone Encounter (Signed)
Refilled: 04/08/2017 Last OV: 06/05/2017 Next OV: not scheduled

## 2017-07-24 NOTE — Telephone Encounter (Signed)
Printed, signed and faxed.  

## 2017-08-01 ENCOUNTER — Other Ambulatory Visit: Payer: Self-pay | Admitting: Internal Medicine

## 2017-08-12 ENCOUNTER — Encounter: Payer: Self-pay | Admitting: Internal Medicine

## 2017-09-12 ENCOUNTER — Other Ambulatory Visit: Payer: Self-pay | Admitting: Internal Medicine

## 2017-09-13 IMAGING — US US BREAST*L* LIMITED INC AXILLA
1 series · 5 of 5 positions shown · non-contrast
Comparison: Screening mammogram dated 10/29/2016

CLINICAL DATA: Screening recall for the left breast asymmetry.

EXAM:
2D DIGITAL DIAGNOSTIC UNILATERAL LEFT MAMMOGRAM WITH CAD AND ADJUNCT
TOMO
LEFT BREAST ULTRASOUND

[Series 1: us breast*left* limited inc axilla · 0.08mm/px · 5 of 5 slices shown]
[im 1/5]
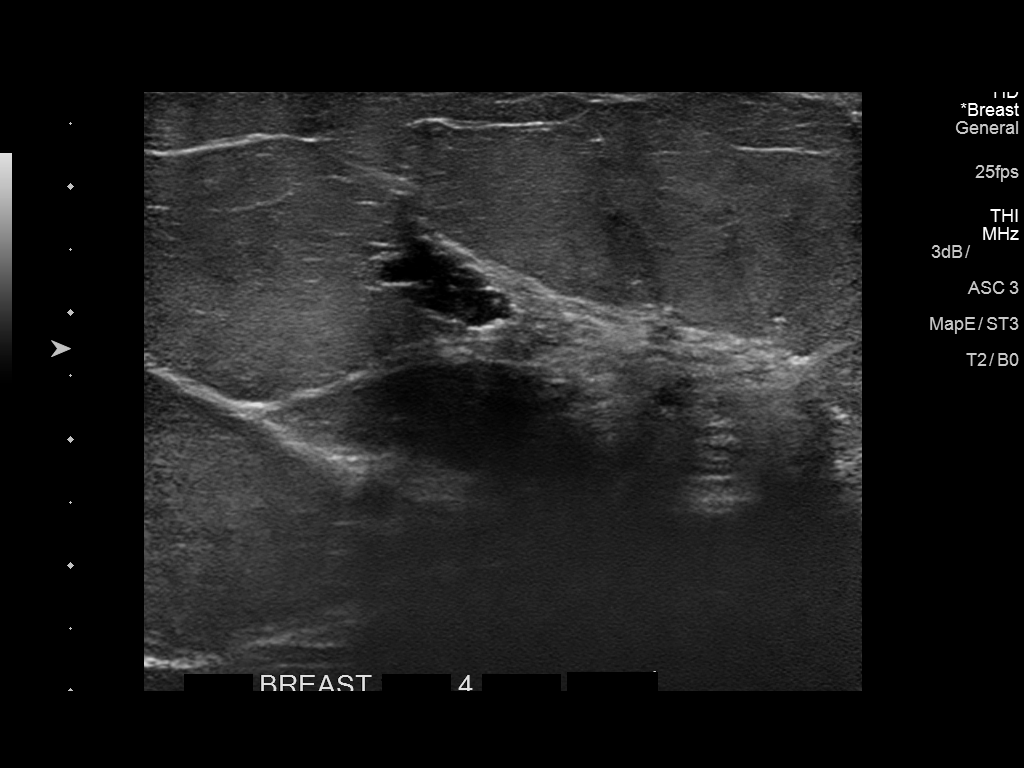
[im 2/5]
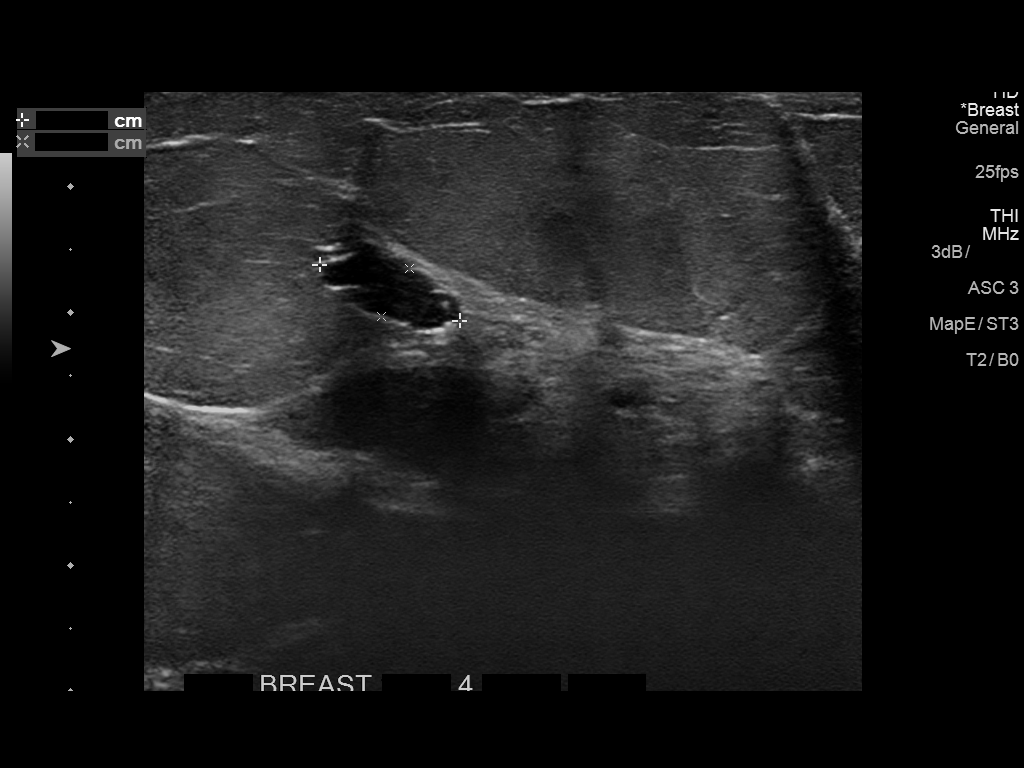
[im 3/5]
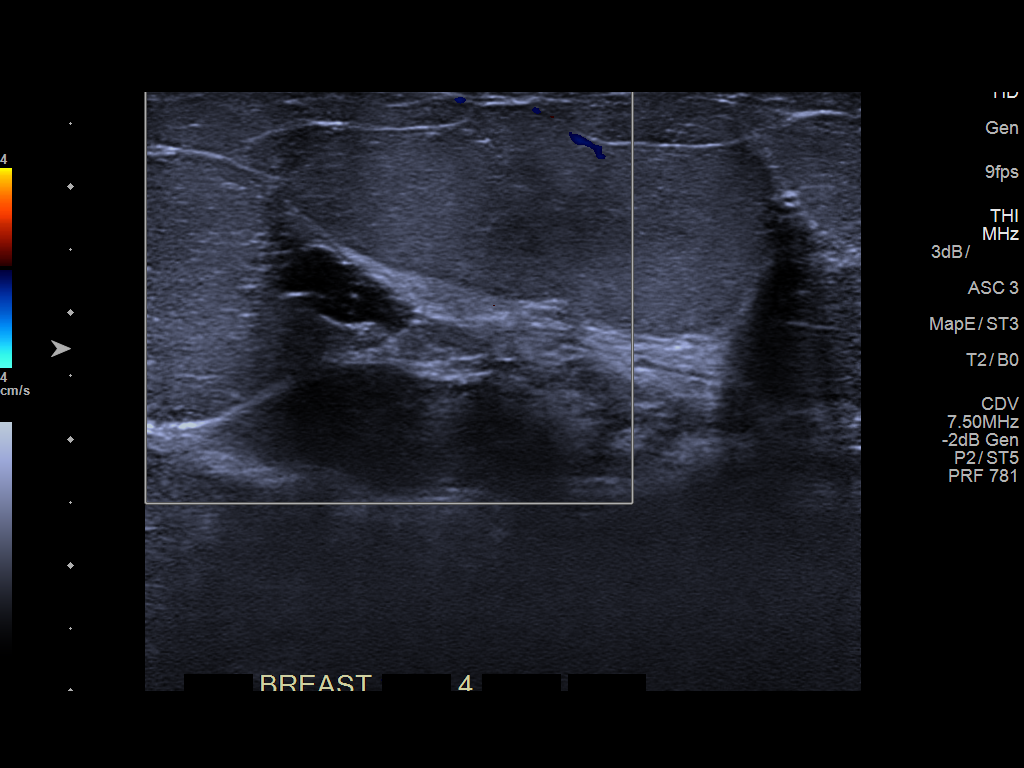
[im 4/5]
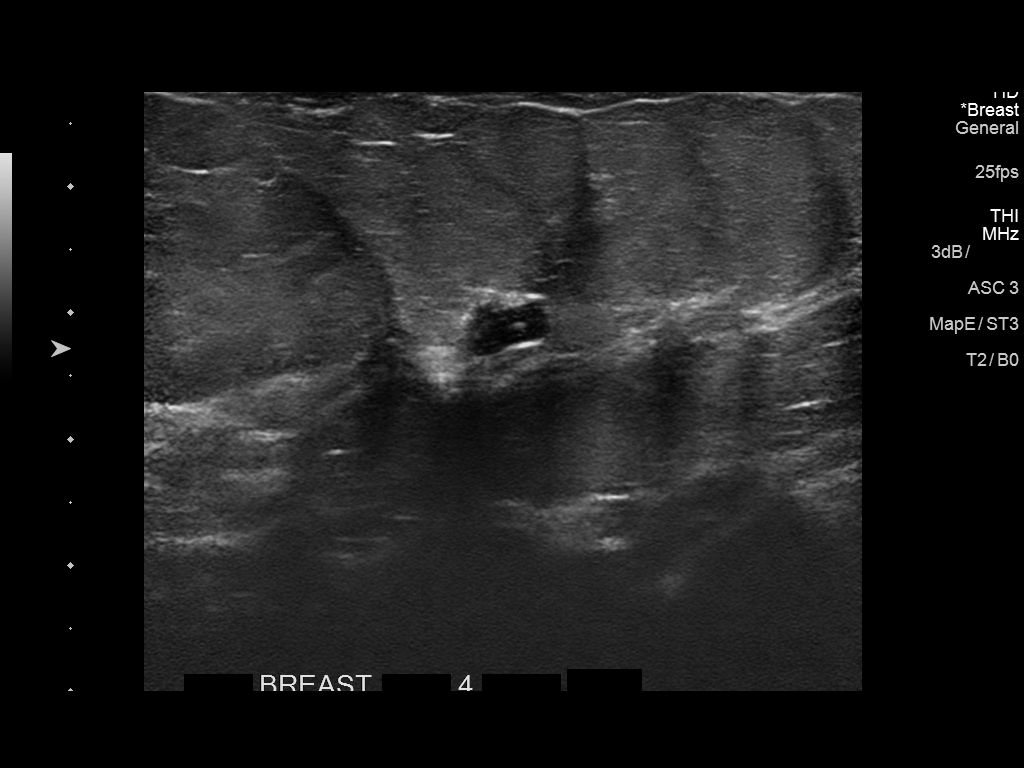
[im 5/5]
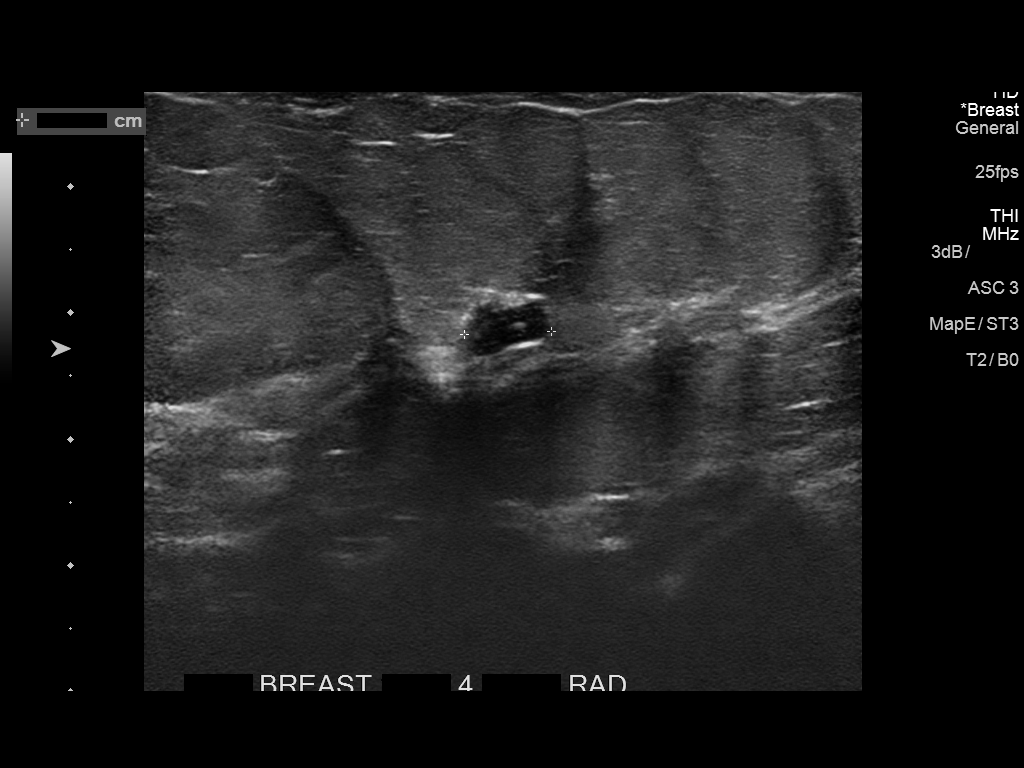

[5 of 5 positions shown; findings below may reference images not displayed]

ACR Breast Density Category b: There are scattered areas of
fibroglandular density.
FINDINGS: In the upper slightly inner quadrant of the left breast there is a
lobulated mass measuring approximately 1 cm.

Mammographic images were processed with CAD.

Ultrasound targeted to the left breast at [DATE], 4 cm from the
nipple demonstrates a hypoechoic circumscribed oval mass with macro
lobulations measuring 1.2 x 0.4 x 0.7 cm. This is favored to
represent a cluster of cysts.
IMPRESSION: There is a probably benign mass in the left breast at [DATE]. This is
favored to represent a cluster of cysts.

RECOMMENDATION:
Six-month follow-up left breast mammogram and ultrasound is
recommended.

I have discussed the findings and recommendations with the patient.
Results were also provided in writing at the conclusion of the
visit. If applicable, a reminder letter will be sent to the patient
regarding the next appointment.

BI-RADS CATEGORY  3: Probably benign.

## 2017-09-17 ENCOUNTER — Other Ambulatory Visit: Payer: Self-pay

## 2017-09-17 MED ORDER — SPIRONOLACTONE 100 MG PO TABS: 100.0000 mg | ORAL_TABLET | Freq: Two times a day (BID) | ORAL | 1 refills | Status: DC

## 2017-09-25 ENCOUNTER — Other Ambulatory Visit (INDEPENDENT_AMBULATORY_CARE_PROVIDER_SITE_OTHER): Payer: BC Managed Care – PPO

## 2017-09-25 ENCOUNTER — Encounter: Payer: Self-pay | Admitting: Obstetrics and Gynecology

## 2017-09-25 ENCOUNTER — Ambulatory Visit: Payer: BC Managed Care – PPO | Admitting: Obstetrics and Gynecology

## 2017-09-25 VITALS — BP 118/75 | HR 91 | Ht 69.0 in | Wt 231.7 lb

## 2017-09-25 DIAGNOSIS — E039 Hypothyroidism, unspecified: Secondary | ICD-10-CM | POA: Diagnosis not present

## 2017-09-25 DIAGNOSIS — N939 Abnormal uterine and vaginal bleeding, unspecified: Secondary | ICD-10-CM | POA: Diagnosis not present

## 2017-09-25 DIAGNOSIS — N951 Menopausal and female climacteric states: Secondary | ICD-10-CM | POA: Diagnosis not present

## 2017-09-25 DIAGNOSIS — E282 Polycystic ovarian syndrome: Secondary | ICD-10-CM

## 2017-09-25 DIAGNOSIS — Z124 Encounter for screening for malignant neoplasm of cervix: Secondary | ICD-10-CM

## 2017-09-25 MED ORDER — MEDROXYPROGESTERONE ACETATE 10 MG PO TABS: 10.0000 mg | ORAL_TABLET | Freq: Every day | ORAL | 2 refills | Status: DC

## 2017-09-25 NOTE — Patient Instructions (Signed)
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
## 2017-09-25 NOTE — Progress Notes (Signed)
Pt stated that she has been on her cycle for about 8 weeks. During that period it was spotting and heavy bleeding. Changing tampons every hour. Pt stated that she has bumps on the outer layer of the vaginal area. X 1 year.

## 2017-09-28 ENCOUNTER — Encounter: Payer: Self-pay | Admitting: Obstetrics and Gynecology

## 2017-10-09 LAB — IGP, COBASHPV16/18
HPV 16: NEGATIVE
HPV 18: NEGATIVE
HPV other hr types: NEGATIVE
PAP Smear Comment: 0

## 2017-10-17 ENCOUNTER — Other Ambulatory Visit: Payer: Self-pay | Admitting: Internal Medicine

## 2017-11-25 ENCOUNTER — Other Ambulatory Visit: Payer: Self-pay | Admitting: Internal Medicine

## 2017-11-25 MED ORDER — METOPROLOL SUCCINATE ER 50 MG PO TB24: 50.0000 mg | ORAL_TABLET | Freq: Every day | ORAL | 1 refills | Status: DC

## 2017-12-19 ENCOUNTER — Ambulatory Visit: Payer: BC Managed Care – PPO | Admitting: Internal Medicine

## 2017-12-19 ENCOUNTER — Encounter: Payer: Self-pay | Admitting: Internal Medicine

## 2017-12-19 VITALS — BP 118/66 | HR 82 | Temp 98.3°F | Resp 15 | Wt 230.4 lb

## 2017-12-19 DIAGNOSIS — E282 Polycystic ovarian syndrome: Secondary | ICD-10-CM

## 2017-12-19 DIAGNOSIS — E669 Obesity, unspecified: Secondary | ICD-10-CM

## 2017-12-19 DIAGNOSIS — E538 Deficiency of other specified B group vitamins: Secondary | ICD-10-CM | POA: Diagnosis not present

## 2017-12-19 DIAGNOSIS — I6523 Occlusion and stenosis of bilateral carotid arteries: Secondary | ICD-10-CM | POA: Diagnosis not present

## 2017-12-19 DIAGNOSIS — R197 Diarrhea, unspecified: Secondary | ICD-10-CM | POA: Diagnosis not present

## 2017-12-19 DIAGNOSIS — E11649 Type 2 diabetes mellitus with hypoglycemia without coma: Secondary | ICD-10-CM

## 2017-12-19 DIAGNOSIS — G4733 Obstructive sleep apnea (adult) (pediatric): Secondary | ICD-10-CM

## 2017-12-19 DIAGNOSIS — I10 Essential (primary) hypertension: Secondary | ICD-10-CM

## 2017-12-19 LAB — COMPREHENSIVE METABOLIC PANEL
ALK PHOS: 128 U/L — AB (ref 39–117)
ALT: 16 U/L (ref 0–35)
AST: 15 U/L (ref 0–37)
Albumin: 4.1 g/dL (ref 3.5–5.2)
BUN: 10 mg/dL (ref 6–23)
CO2: 29 mEq/L (ref 19–32)
Calcium: 9.2 mg/dL (ref 8.4–10.5)
Chloride: 104 mEq/L (ref 96–112)
Creatinine, Ser: 1.19 mg/dL (ref 0.40–1.20)
GFR: 51.23 mL/min — ABNORMAL LOW (ref >60.00)
Glucose, Bld: 223 mg/dL — ABNORMAL HIGH (ref 70–99)
POTASSIUM: 4.4 meq/L (ref 3.5–5.1)
Sodium: 137 mEq/L (ref 135–145)
TOTAL PROTEIN: 7.6 g/dL (ref 6.0–8.3)
Total Bilirubin: 0.3 mg/dL (ref 0.2–1.2)

## 2017-12-19 LAB — POCT GLYCOSYLATED HEMOGLOBIN (HGB A1C): Hemoglobin A1C: 7.2 % — AB (ref 4.0–5.6)

## 2017-12-19 LAB — VITAMIN B12: Vitamin B-12: 189 pg/mL — ABNORMAL LOW (ref 211–911)

## 2017-12-19 LAB — MAGNESIUM: Magnesium: 1.8 mg/dL (ref 1.5–2.5)

## 2017-12-19 NOTE — Patient Instructions (Addendum)
Increase  your  insulin dose by 2 units   To get your post prandial sugars  Under 160    increase  Your dose by 5 units on night eating pasta,  4 units if eating potatoes   Start 30 minutes of exercise  Son!   carotid ultrasound ordered  GET A CHIN STRAP TO KEEP YOU FROM SNORING! (TRY [facility])

## 2017-12-19 NOTE — Progress Notes (Signed)
Subjective:  Patient ID: Madeline Strickland, female    DOB: 01/06/1971  Age: 47 y.o. MRN: [id number]  CC: The primary encounter diagnosis was Uncontrolled type 2 diabetes mellitus with hypoglycemia without coma ([place]). Diagnoses of Carotid artery calcification, bilateral, B12 deficiency, Diarrhea, unspecified type, Polycystic ovarian syndrome, Obstructive sleep apnea, Obesity (BMI 30-39.9), and Essential hypertension were also pertinent to this visit.  HPI Madeline Strickland presents for  6 month follow up on diabetes.  Patient has no complaints today.  Patient is following a low glycemic index diet and taking all prescribed medications regularly without side effects.  Fasting sugars have been under less than 130 most of the time and post prandials have been around 175 , but have been as high as 230 after eating pasta. She Using mixed  Insulin 70/30 In the evenings only due to recurrent afternoon lows with use of insulin in the morning.  She does not adjust her evening dose for the meal.  Patient is not exercising or  intentionally trying to lose weight .  Patient has had an eye exam in the last 12 months and checks feet regularly for signs of infection.  Patient does not walk barefoot outside,  And denies any numbness tingling or burning in feet. Patient is up to date on all recommended vaccinations  Small cough and PND.     Snores , had sleep study several years ago   Cost her $900    Lab Results  Component Value Date   VITAMINB12 189 (L) 12/19/2017      Outpatient Medications Prior to Visit  Medication Sig Dispense Refill  . ALPRAZolam (XANAX) 0.5 MG tablet TAKE 1 TABLET BY MOUTH TWICE DAILY AS NEEDED FOR ANXIETY OR INSOMNIA 60 tablet 5  . Alum & Mag Hydroxide-Simeth (MAALOX ADVANCED PO) Take by mouth at bedtime.      Marland Kitchen b complex vitamins tablet Take 1 tablet by mouth daily.      . B-D UF III MINI PEN NEEDLES 31G X 5 MM MISC USE AS DIRECTED THREE TIMES DAILY 100 each 0  . Blood Glucose Monitoring Suppl  (ONE TOUCH ULTRA SYSTEM KIT) W/DEVICE KIT 1 kit by Does not apply route once. 1 each 0  . famotidine (PEPCID) 20 MG tablet TAKE 1 TABLET(20 MG) BY MOUTH TWICE DAILY 180 tablet 1  . ferrous fumarate (HEMOCYTE - 106 MG FE) 325 (106 FE) MG TABS Take 1 tablet by mouth.      . Fluticasone-Salmeterol (ADVAIR DISKUS) 250-50 MCG/DOSE AEPB INHALE 1 PUFF INTO THE LUNGS TWICE DAILY 1 each 5  . Lancets (ONETOUCH ULTRASOFT) lancets Use as instructed 100 each 12  . levothyroxine (SYNTHROID, LEVOTHROID) 200 MCG tablet TAKE 1 TABLET(200 MCG) BY MOUTH DAILY BEFORE BREAKFAST 90 tablet 1  . medroxyPROGESTERone (PROVERA) 10 MG tablet Take 1 tablet (10 mg total) by mouth daily. Use for ten days 10 tablet 2  . metoprolol succinate (TOPROL-XL) 50 MG 24 hr tablet Take 1 tablet (50 mg total) by mouth daily. Take with or immediately following a meal. 90 tablet 1  . montelukast (SINGULAIR) 10 MG tablet TAKE 1 TABLET BY MOUTH AT BEDTIME 90 tablet 1  . NOVOLOG MIX 70/30 FLEXPEN (70-30) 100 UNIT/ML FlexPen INJECT 15 UNITS UNDER THE SKIN IN THE MORNING AND 25 UNITS IN THE EVENING BEFORE MEALS 15 mL 0  . nystatin (MYCOSTATIN) powder Apply topically 2 (two) times daily. on affected areas. 56.7 g 3  . nystatin-triamcinolone ointment (MYCOLOG) Apply 1 application topically  2 (two) times daily. 30 g 0  . ONETOUCH VERIO test strip TEST BEFORE MEALS AS DIRECTED. 100 each 0  . phentermine (ADIPEX-P) 37.5 MG tablet Take 1 tablet (37.5 mg total) by mouth daily before breakfast. Or 1/2 pill twice daily 30 tablet 2  . spironolactone (ALDACTONE) 100 MG tablet Take 1 tablet (100 mg total) by mouth 2 (two) times daily. 180 tablet 1  . Syringe/Needle, Disp, (SYRINGE 3CC/25GX1") 25G X 1" 3 ML MISC Use as directed 15 each 0  . levothyroxine (SYNTHROID, LEVOTHROID) 200 MCG tablet Take 1 tablet (200 mcg total) by mouth daily before breakfast. Take with 25 mcg,  Total dose 225 mcg 90 tablet 0  . albuterol (ACCUNEB) 1.25 MG/3ML nebulizer solution Take  3 mLs (1.25 mg total) by nebulization every 6 (six) hours as needed for wheezing. 75 mL 12  . B-D UF III MINI PEN NEEDLES 31G X 5 MM MISC USE AS DIRECTED THREE TIMES DAILY (Patient not taking: Reported on 12/19/2017) 100 each 2   Facility-Administered Medications Prior to Visit  Medication Dose Route Frequency Provider Last Rate Last Dose  . betamethasone acetate-betamethasone sodium phosphate (CELESTONE) injection 3 mg  3 mg Intramuscular Once Edrick Kins, DPM        Review of Systems;  Patient denies headache, fevers, malaise, unintentional weight loss, skin rash, eye pain, sinus congestion and sinus pain, sore throat, dysphagia,  hemoptysis , cough, dyspnea, wheezing, chest pain, palpitations, orthopnea, edema, abdominal pain, nausea, melena, diarrhea, constipation, flank pain, dysuria, hematuria, urinary  Frequency, nocturia, numbness, tingling, seizures,  Focal weakness, Loss of consciousness,  Tremor, insomnia, depression, anxiety, and suicidal ideation.      Objective:  BP 118/66 (BP Location: Left Arm, Patient Position: Sitting, Cuff Size: Large)   Pulse 82   Temp 98.3 F (36.8 C) (Oral)   Resp 15   Wt 230 lb 6 oz (104.5 kg)   SpO2 99%   BMI 34.02 kg/m   BP Readings from Last 3 Encounters:  12/19/17 118/66  09/25/17 118/75  06/18/17 128/74    Wt Readings from Last 3 Encounters:  12/19/17 230 lb 6 oz (104.5 kg)  09/25/17 231 lb 11.2 oz (105.1 kg)  06/18/17 233 lb (105.7 kg)    General appearance: alert, cooperative and appears stated age Ears: normal TM's and external ear canals both ears Throat: lips, mucosa, and tongue normal; teeth and gums normal Neck: no adenopathy, bilateral  carotid bruit, supple, symmetrical, trachea midline and thyroid not enlarged, symmetric, no tenderness/mass/nodules Back: symmetric, no curvature. ROM normal. No CVA tenderness. Lungs: clear to auscultation bilaterally Heart: regular rate and rhythm, S1, S2 normal, no murmur, click, rub  or gallop Abdomen: soft, non-tender; bowel sounds normal; no masses,  no organomegaly Pulses: 2+ and symmetric Skin: Skin color, texture, turgor normal. No rashes or lesions Lymph nodes: Cervical, supraclavicular, and axillary nodes normal.  Lab Results  Component Value Date   HGBA1C 7.2 (A) 12/19/2017   HGBA1C 7.6 (H) 06/03/2017   HGBA1C 8.2 08/21/2016    Lab Results  Component Value Date   CREATININE 1.19 12/19/2017   CREATININE 1.02 06/03/2017   CREATININE 1.03 08/23/2016    Lab Results  Component Value Date   WBC 14.5 (H) 06/03/2017   HGB 12.3 06/03/2017   HCT 36.3 06/03/2017   PLT 389 06/03/2017   GLUCOSE 223 (H) 12/19/2017   CHOL 162 06/03/2017   TRIG 97.0 06/03/2017   HDL 35.00 (L) 06/03/2017   LDLDIRECT 131.0 10/24/2015  LDLCALC 108 (H) 06/03/2017   ALT 16 12/19/2017   AST 15 12/19/2017   NA 137 12/19/2017   K 4.4 12/19/2017   CL 104 12/19/2017   CREATININE 1.19 12/19/2017   BUN 10 12/19/2017   CO2 29 12/19/2017   TSH 0.04 (L) 06/03/2017   HGBA1C 7.2 (A) 12/19/2017   MICROALBUR <0.7 06/03/2017    No results found.  Assessment & Plan:   Problem List Items Addressed This Visit    Carotid artery calcification, bilateral    Carotid ultrasound ordered       Relevant Orders   US Carotid Duplex Bilateral   Hypertension    Well controlled on current regimen. Renal function stable, no changes today.  Lab Results  Component Value Date   CREATININE 1.19 12/19/2017   Lab Results  Component Value Date   NA 137 12/19/2017   K 4.4 12/19/2017   CL 104 12/19/2017   CO2 29 12/19/2017         Obesity (BMI 30-39.9)    I have addressed  BMI and recommended wt loss of 10% of body weight over the next 6 months using  regular exercise a minimum of 5 days per week.        Obstructive sleep apnea    Mild to moderate by recent study.  CPAP titration study ordered but never done because of  pocket cost.         Polycystic ovarian syndrome    With  hirsutism, Type 2 DM.  Managed with spironolactone and metformin       Type II diabetes mellitus, uncontrolled ([place]) - Primary    Improved but not at goal.  Advised to increase her dose of insulin by 2 points.  4 if eating potatoes.  5 if pasta .  Lab Results  Component Value Date   HGBA1C 7.2 (A) 12/19/2017   Lab Results  Component Value Date   MICROALBUR <0.7 06/03/2017         Relevant Orders   POCT glycosylated hemoglobin (Hb A1C) (Completed)   Comprehensive metabolic panel (Completed)    Other Visit Diagnoses    B12 deficiency       Relevant Orders   B12 (Completed)   Diarrhea, unspecified type       Relevant Orders   Magnesium (Completed)    A total of 25 minutes of face to face time was spent with patient more than half of which was spent in counselling about the above mentioned conditions  and coordination of care   I am having Michaele D. Currey maintain her Alum & Mag Hydroxide-Simeth (MAALOX ADVANCED PO), b complex vitamins, ferrous fumarate, albuterol, ONE TOUCH ULTRA SYSTEM KIT, onetouch ultrasoft, SYRINGE 3CC/25GX1", nystatin-triamcinolone ointment, nystatin, B-D UF III MINI PEN NEEDLES, ONETOUCH VERIO, Fluticasone-Salmeterol, phentermine, B-D UF III MINI PEN NEEDLES, famotidine, ALPRAZolam, levothyroxine, montelukast, spironolactone, medroxyPROGESTERone, NOVOLOG MIX 70/30 FLEXPEN, and metoprolol succinate. We will continue to administer betamethasone acetate-betamethasone sodium phosphate.  No orders of the defined types were placed in this encounter.   Medications Discontinued During This Encounter  Medication Reason  . levothyroxine (SYNTHROID, LEVOTHROID) 200 MCG tablet     Follow-up: No follow-ups on file.   Crecencio Mc, MD

## 2017-12-21 DIAGNOSIS — I6523 Occlusion and stenosis of bilateral carotid arteries: Secondary | ICD-10-CM | POA: Insufficient documentation

## 2017-12-21 NOTE — Assessment & Plan Note (Signed)
Well controlled on current regimen. Renal function stable, no changes today.  Lab Results  Component Value Date   CREATININE 1.19 12/19/2017   Lab Results  Component Value Date   NA 137 12/19/2017   K 4.4 12/19/2017   CL 104 12/19/2017   CO2 29 12/19/2017

## 2017-12-21 NOTE — Assessment & Plan Note (Signed)
I have addressed  BMI and recommended wt loss of 10% of body weight over the next 6 months using  regular exercise a minimum of 5 days per week.

## 2017-12-21 NOTE — Assessment & Plan Note (Signed)
Improved but not at goal.  Advised to increase her dose of insulin by 2 points.  4 if eating potatoes.  5 if pasta .  Lab Results  Component Value Date   HGBA1C 7.2 (A) 12/19/2017   Lab Results  Component Value Date   MICROALBUR <0.7 06/03/2017

## 2017-12-21 NOTE — Assessment & Plan Note (Signed)
Carotid ultrasound ordered.

## 2017-12-21 NOTE — Assessment & Plan Note (Signed)
With hirsutism, Type 2 DM.  Managed with spironolactone and metformin

## 2017-12-21 NOTE — Assessment & Plan Note (Signed)
Mild to moderate by recent study.  CPAP titration study ordered but never done because of  pocket cost.

## 2017-12-22 ENCOUNTER — Other Ambulatory Visit: Payer: Self-pay | Admitting: Internal Medicine

## 2017-12-22 DIAGNOSIS — D510 Vitamin B12 deficiency anemia due to intrinsic factor deficiency: Secondary | ICD-10-CM

## 2017-12-22 MED ORDER — CYANOCOBALAMIN 1000 MCG/ML IJ SOLN: INTRAMUSCULAR | 4 refills | Status: DC

## 2017-12-22 MED ORDER — "SYRINGE 25G X 1"" 3 ML MISC": 0 refills | Status: DC

## 2017-12-22 NOTE — Progress Notes (Signed)
cyan 

## 2017-12-22 NOTE — Assessment & Plan Note (Signed)
Repeat level is low again.  Advised to resume IM injections  Daily x 3,  Then weekly  2,  Then monthly therafter  Lab Results  Component Value Date   VITAMINB12 189 (L) 12/19/2017

## 2018-01-08 ENCOUNTER — Telehealth: Payer: Self-pay

## 2018-01-08 ENCOUNTER — Ambulatory Visit
Admission: RE | Admit: 2018-01-08 | Discharge: 2018-01-08 | Disposition: A | Discharge status: Home / Self Care | Payer: BC Managed Care – PPO | Source: Ambulatory Visit | Attending: Internal Medicine | Admitting: Internal Medicine

## 2018-01-08 DIAGNOSIS — I6523 Occlusion and stenosis of bilateral carotid arteries: Secondary | ICD-10-CM

## 2018-01-08 NOTE — Telephone Encounter (Signed)
Copied from CRM [phone]. Topic: General - Other >> Jan 08, 2018  3:25 PM Gerrianne ScalePayne, Angela L wrote: Reason for CRM: pt calling stating that she would like to go somewhere else that is not a hospital her appt at  [facility] will cost her $920

## 2018-01-17 ENCOUNTER — Other Ambulatory Visit: Payer: Self-pay | Admitting: Internal Medicine

## 2018-01-26 ENCOUNTER — Other Ambulatory Visit: Payer: Self-pay | Admitting: Internal Medicine

## 2018-01-28 ENCOUNTER — Telehealth: Payer: BC Managed Care – PPO | Admitting: Physician Assistant

## 2018-01-28 DIAGNOSIS — J069 Acute upper respiratory infection, unspecified: Secondary | ICD-10-CM

## 2018-01-28 MED ORDER — BENZONATATE 100 MG PO CAPS: 100.0000 mg | ORAL_CAPSULE | Freq: Three times a day (TID) | ORAL | 0 refills | Status: DC

## 2018-01-28 MED ORDER — IPRATROPIUM BROMIDE 0.06 % NA SOLN: 2.0000 | Freq: Four times a day (QID) | NASAL | 12 refills | Status: DC

## 2018-01-28 MED ORDER — AMOXICILLIN-POT CLAVULANATE 875-125 MG PO TABS: 1.0000 | ORAL_TABLET | Freq: Two times a day (BID) | ORAL | 0 refills | Status: DC

## 2018-01-28 NOTE — Addendum Note (Signed)
Addended by: Bennie Pierini on: 01/28/2018 08:03 PM   Modules accepted: Orders

## 2018-01-28 NOTE — Telephone Encounter (Signed)
It has been rescheduled at [facility] for 10/10. mychart message sent to pt

## 2018-01-28 NOTE — Progress Notes (Signed)
We are sorry you are not feeling well.  Here is how we plan to help!  Based on what you have shared with me, it looks like you may have a viral upper respiratory infection or a "common cold".  Colds are caused by a large number of viruses; however, rhinovirus is the most common cause.   Symptoms of the common cold vary from person to person, with common symptoms including sore throat, cough, and malaise.  A low-grade fever of 100.4 may present, but is often uncommon.  Symptoms vary however, and are closely related to a person's age or underlying illnesses.  The most common symptoms associated with the common cold are nasal discharge or congestion, cough, sneezing, headache and pressure in the ears and face.  Cold symptoms usually persist for about 3 to 10 days, but can last up to 2 weeks.  It is important to know that colds do not cause serious illness or complications in most cases.    The common cold is transmitted from person to person, with the most common method of transmission being a person's hands.  The virus is able to live on the skin and can infect other persons for up to 2 hours after direct contact.  Also, colds are transmitted when someone coughs or sneezes; thus, it is important to cover the mouth to reduce this risk.  To keep the spread of the common cold at bay, good hand hygiene is very important.  This is an infection that is most likely caused by a virus. There are no specific treatments for the common cold other than to help you with the symptoms until the infection runs its course.    For nasal congestion, you may use an oral decongestants such as Mucinex D or if you have glaucoma or high blood pressure use plain Mucinex.  Saline nasal spray or nasal drops can help and can safely be used as often as needed for congestion.  For your congestion, I have prescribed Ipratropium Bromide nasal spray 0.03% two sprays in each nostril 2-3 times a day  If you do not have a history of heart  disease, hypertension, diabetes or thyroid disease, prostate/bladder issues or glaucoma, you may also use Sudafed to treat nasal congestion.  It is highly recommended that you consult with a pharmacist or your primary care physician to ensure this medication is safe for you to take.     If you have a cough, you may use cough suppressants such as Delsym and Robitussin.  If you have glaucoma or high blood pressure, you can also use Coricidin HBP.   For cough I have prescribed for you A prescription cough medication called Tessalon Perles 100 mg. You may take 1-2 capsules every 8 hours as needed for cough.   TO HELP YOU SLEEP: You could try an OTC antihistamine.  Chlorpheniramine 4 mg near bed time will make you sleepy and will also help with sinus congestion.    If you have a sore or scratchy throat, use a saltwater gargle-  to  teaspoon of salt dissolved in a 4-ounce to 8-ounce glass of warm water.  Gargle the solution for approximately 15-30 seconds and then spit.  It is important not to swallow the solution.  You can also use throat lozenges/cough drops and Chloraseptic spray to help with throat pain or discomfort.  Warm or cold liquids can also be helpful in relieving throat pain.  For headache, pain or general discomfort, you can use Ibuprofen or  Tylenol as directed.   Some authorities believe that zinc sprays or the use of Echinacea may shorten the course of your symptoms.   HOME CARE . Only take medications as instructed by your medical team. . Be sure to drink plenty of fluids. Water is fine as well as fruit juices, sodas and electrolyte beverages. You may want to stay away from caffeine or alcohol. If you are nauseated, try taking small sips of liquids. How do you know if you are getting enough fluid? Your urine should be a pale yellow or almost colorless. . Get rest. . Taking a steamy shower or using a humidifier may help nasal congestion and ease sore throat pain. You can place a towel  over your head and breathe in the steam from hot water coming from a faucet. . Using a saline nasal spray works much the same way. . Cough drops, hard candies and sore throat lozenges may ease your cough. . Avoid close contacts especially the very young and the elderly . Cover your mouth if you cough or sneeze . Always remember to wash your hands.   GET HELP RIGHT AWAY IF: . You develop worsening fever. . If your symptoms do not improve within 10 days . You become short of breath. . You develop yellow or green discharge from your nose over 3 days. . You have coughing fits . You develop a severe head ache or visual changes. . You develop shortness of breath or difficulty breathing. . Your symptoms persist after you have completed your treatment plan  MAKE SURE YOU   Understand these instructions.  Will watch your condition.  Will get help right away if you are not doing well or get worse.  Your e-visit answers were reviewed by a board certified advanced clinical practitioner to complete your personal care plan. Depending upon the condition, your plan could have included both over the counter or prescription medications. Please review your pharmacy choice. If there is a problem, you may call our nursing hot line at and have the prescription routed to another pharmacy. Your safety is important to [place]. If you have drug allergies check your prescription carefully.   You can use MyChart to ask questions about today's visit, request a non-urgent call back, or ask for a work or school excuse for 24 hours related to this e-Visit. If it has been greater than 24 hours you will need to follow up with your provider, or enter a new e-Visit to address those concerns. You will get an e-mail in the next two days asking about your experience.  I hope that your e-visit has been valuable and will speed your recovery. Thank you for using e-visits.

## 2018-02-03 ENCOUNTER — Ambulatory Visit: Payer: Self-pay | Admitting: *Deleted

## 2018-02-03 NOTE — Telephone Encounter (Signed)
Pt reports productive cough, congestion since 01/28/18. Did E-Visit; prescribed Augmentin which she has been taking since 10/8.  Reports symptoms "Somewhat improved" but still with productive cough, LGT of 99.4.  States symptoms worse in AM, improves throughout day. Using Musinex, Advil, NS nasal spray, using albuterol inhaler once a day. Reports some chest tightness this am, denies wheezing but "Hear a crackle at times, in mornings only." States mild SOB, only after coughing spell in mornings." Pt initially calling to request prednisone be called in for her. NT directed pt to UC as no availability at practice today. Pt states she tried UC several times over W/E, too long of a wait, "I won't go back. I don't feel up to waiting." Per protocol, may be seen within 24hrs.  Appt made with L.Guse for tomorrow AM.  CAre advise given. Pt states WILL go to UC if symptoms worsen, temp of 101.0, any SOB, wheezing, CP occurs.  Reason for Disposition . [1] Continuous (nonstop) coughing interferes with work or school AND [2] no improvement using cough treatment per Care Advice  Answer Assessment - Initial Assessment Questions 1. ONSET: "When did the cough begin?"      8 days ago 2. SEVERITY: "How bad is the cough today?"      Severe in am, better throughout day 3. RESPIRATORY DISTRESS: "Describe your breathing."     SOB at times after coughing spell, only in AMs 4. FEVER: "Do you have a fever?" If so, ask: "What is your temperature, how was it measured, and when did it start?"     LGT  Max at 99.4 5. SPUTUM: "Describe the color of your sputum" (clear, white, yellow, green)     Yellowish now,thin, was green initially 6. HEMOPTYSIS: "Are you coughing up any blood?" If so ask: "How much?" (flecks, streaks, tablespoons, etc.)    no 7. CARDIAC HISTORY: "Do you have any history of heart disease?" (e.g., heart attack, congestive heart failure)      no 8. LUNG HISTORY: "Do you have any history of lung disease?"  (e.g.,  pulmonary embolus, asthma, emphysema)     Bronchitis 9. PE RISK FACTORS: "Do you have a history of blood clots?" (or: recent major surgery, recent prolonged travel, bedridden)    no 10. OTHER SYMPTOMS: "Do you have any other symptoms?" (e.g., runny nose, wheezing, chest pain)      Hears "crackle" at times in mornings, "Doesn't last all day."  Protocols used: COUGH - ACUTE PRODUCTIVE-A-AH

## 2018-02-03 NOTE — Telephone Encounter (Signed)
Patient scheduled to see NP on 02/04/18

## 2018-02-04 ENCOUNTER — Encounter: Payer: Self-pay | Admitting: Family Medicine

## 2018-02-04 ENCOUNTER — Ambulatory Visit (INDEPENDENT_AMBULATORY_CARE_PROVIDER_SITE_OTHER): Payer: BC Managed Care – PPO | Admitting: Family Medicine

## 2018-02-04 ENCOUNTER — Other Ambulatory Visit: Payer: Self-pay

## 2018-02-04 ENCOUNTER — Ambulatory Visit (INDEPENDENT_AMBULATORY_CARE_PROVIDER_SITE_OTHER): Payer: BC Managed Care – PPO

## 2018-02-04 VITALS — BP 134/92 | HR 80 | Temp 98.6°F | Ht 68.0 in | Wt 234.2 lb

## 2018-02-04 DIAGNOSIS — J45901 Unspecified asthma with (acute) exacerbation: Secondary | ICD-10-CM

## 2018-02-04 DIAGNOSIS — J209 Acute bronchitis, unspecified: Secondary | ICD-10-CM

## 2018-02-04 DIAGNOSIS — R059 Cough, unspecified: Secondary | ICD-10-CM

## 2018-02-04 DIAGNOSIS — R062 Wheezing: Secondary | ICD-10-CM

## 2018-02-04 DIAGNOSIS — R05 Cough: Secondary | ICD-10-CM

## 2018-02-04 MED ORDER — PREDNISONE 10 MG (21) PO TBPK: ORAL_TABLET | ORAL | 0 refills | Status: DC

## 2018-02-04 MED ORDER — ALBUTEROL SULFATE (2.5 MG/3ML) 0.083% IN NEBU
2.5000 mg | INHALATION_SOLUTION | Freq: Once | RESPIRATORY_TRACT | Status: AC
  Administered 2018-02-04: 2.5 mg via RESPIRATORY_TRACT

## 2018-02-04 MED ORDER — METHYLPREDNISOLONE ACETATE 40 MG/ML IJ SUSP
40.0000 mg | Freq: Once | INTRAMUSCULAR | Status: AC
  Administered 2018-02-04: 40 mg via INTRAMUSCULAR

## 2018-02-04 NOTE — Patient Instructions (Addendum)
Do nebulizer treatment at home at least 2 times per day consistently for next 5 days (can do up to every 6 hours per day if needed).

## 2018-02-04 NOTE — Progress Notes (Signed)
Subjective:    Patient ID: Madeline Strickland, female    DOB: 04/22/1971, 47 y.o.   MRN: [id number]  HPI  Presents to clinic c/o productive cough for over 1 week. She did e-visit and was prescribed course of Augmentin due to history chronic bronchitis/asthma and diabetes.   She has approximately 2 days left of Augmentin course, but continues to have cough, feeling winded and wheezing.  States first cough was productive with green phlegm, now is thinner yellow phlegm.  She did a albuterol breathing treatment last night which did help some.  Denies any fever or chills.  Also has been using Sudafed to help thin out congestion.  She takes Zyrtec and Singulair regularly every day.  Patient Active Problem List   Diagnosis Date Noted  . Carotid artery calcification, bilateral 12/21/2017  . Mass of upper outer quadrant of left breast 06/18/2017  . Leukocytosis 06/05/2017  . Flu-like symptoms 05/22/2016  . Anxiety state 07/20/2015  . Fatigue due to sleep pattern disturbance 03/03/2015  . Vitamin B12 deficiency anemia due to intrinsic factor deficiency 03/03/2015  . Vitamin D deficiency 03/03/2015  . Otitis externa, eczematoid 10/24/2014  . Hyperlipidemia associated with type 2 diabetes mellitus (HCC) 07/24/2012  . Type II diabetes mellitus, uncontrolled (HCC) 04/28/2012  . Screening for breast cancer 03/28/2011  . Chronic back pain greater than 3 months duration   . Screening for cervical cancer 03/27/2011  . Tobacco abuse   . Intrinsic asthma   . Hypertension   . Polycystic ovarian syndrome   . Hypothyroidism due to acquired atrophy of thyroid   . Obstructive sleep apnea 12/13/2010  . Obesity (BMI 30-39.9) 12/13/2010   Social History   Tobacco Use  . Smoking status: Current Every Day Smoker    Types: Cigarettes  . Smokeless tobacco: Never Used  . Tobacco comment: smokes 1-2 cigs a day  Substance Use Topics  . Alcohol use: No   Review of Systems    Constitutional: +fatigue. Negative  for chills, and fever.  HENT: Negative for congestion, ear pain, sinus pain and sore throat.   Eyes: Negative.   Respiratory: +cough, shortness of breath and wheezing.   Cardiovascular: Negative for chest pain, palpitations and leg swelling.  Gastrointestinal: Negative for abdominal pain, diarrhea, nausea and vomiting.  Genitourinary: Negative for dysuria, frequency and urgency.  Musculoskeletal: Negative for arthralgias and myalgias.  Skin: Negative for color change, pallor and rash.  Neurological: Negative for syncope, light-headedness and headaches.  Psychiatric/Behavioral: The patient is not nervous/anxious.       Objective:   Physical Exam  Constitutional: She is oriented to person, place, and time. No distress.  HENT:  Head: Normocephalic and atraumatic.  Eyes: Conjunctivae and EOM are normal. No scleral icterus.  Neck: Neck supple. No tracheal deviation present.  Pulmonary/Chest: Effort normal. No respiratory distress.  Wheezes bilat upper lobes. Diffuse rhonchi that improves with cough  Musculoskeletal: She exhibits no edema.  Gait normal.   Lymphadenopathy:    She has no cervical adenopathy.  Neurological: She is alert and oriented to person, place, and time.  Skin: Skin is warm and dry. She is not diaphoretic. No pallor.  Psychiatric: She has a normal mood and affect. Her behavior is normal.  Nursing note and vitals reviewed.     Vitals:   02/04/18 0957  BP: (!) 134/92  Pulse: 80  Temp: 98.6 F (37 C)  SpO2: 97%    Assessment & Plan:   Acute bronchitis with asthma with  acute exacerbation, cough, wheezing- patient advised to finish Augmentin course as prescribed during her E-visit.  We will give patient breathing treatment of DuoNeb in clinic and also 40 mg IM Depo-Medrol.  She will get chest x-ray in clinic to look for any pneumonia, if pneumonia seen on chest x-ray we will do a another antibiotic course.  Patient will also take oral steroid taper at home to help  calm inflammation.  Patient advised to do her albuterol nebulizer treatment at home at least 2 times a day consistently for the next 5 days, also can use as needed more often during those 5 days.  After the 5 days advised to use nebulizer treatment as needed for shortness of breath or wheezing.    Administrations This Visit    albuterol (PROVENTIL) (2.5 MG/3ML) 0.083% nebulizer solution 2.5 mg    Admin Date 02/04/2018 Action Given Dose 2.5 mg Route Nebulization Administered By Clearnce Sorrel, RMA       methylPREDNISolone acetate (DEPO-MEDROL) injection 40 mg    Admin Date 02/04/2018 Action Given Dose 40 mg Route Intramuscular Administered By Clearnce Sorrel, RMA         Patient advised if her breathing worsens, cannot catch breath, it is not helped by nebulizer treatment she should go to emergency department right away for evaluation.  Patient will follow-up in 2 weeks to recheck lungs and assess for symptom resolution.

## 2018-02-04 NOTE — Progress Notes (Signed)
l °

## 2018-02-05 MED ORDER — ALBUTEROL SULFATE 1.25 MG/3ML IN NEBU: 1.0000 | INHALATION_SOLUTION | Freq: Four times a day (QID) | RESPIRATORY_TRACT | 12 refills | Status: DC | PRN

## 2018-02-05 NOTE — Progress Notes (Unsigned)
Albuterol neb solution refill sent to pharmacy

## 2018-02-09 ENCOUNTER — Other Ambulatory Visit: Payer: Self-pay | Admitting: Internal Medicine

## 2018-02-20 ENCOUNTER — Other Ambulatory Visit: Payer: Self-pay

## 2018-02-25 MED ORDER — INSULIN ASPART PROT & ASPART (70-30 MIX) 100 UNIT/ML PEN: PEN_INJECTOR | SUBCUTANEOUS | 0 refills | Status: DC

## 2018-02-27 ENCOUNTER — Other Ambulatory Visit: Payer: Self-pay

## 2018-02-27 NOTE — Telephone Encounter (Signed)
Refilled: 07/23/2017 Last OV: 12/19/2017 Next OV: not scheduled

## 2018-02-28 ENCOUNTER — Other Ambulatory Visit: Payer: Self-pay | Admitting: Internal Medicine

## 2018-02-28 MED ORDER — ALPRAZOLAM 0.5 MG PO TABS: ORAL_TABLET | ORAL | 5 refills | Status: DC

## 2018-02-28 MED ORDER — ALPRAZOLAM 0.5 MG PO TABS: ORAL_TABLET | ORAL | 3 refills | Status: DC

## 2018-03-11 ENCOUNTER — Other Ambulatory Visit: Payer: Self-pay | Admitting: Internal Medicine

## 2018-05-01 ENCOUNTER — Other Ambulatory Visit: Payer: Self-pay | Admitting: Internal Medicine

## 2018-05-20 ENCOUNTER — Other Ambulatory Visit: Payer: Self-pay | Admitting: Internal Medicine

## 2018-05-30 ENCOUNTER — Telehealth: Payer: BC Managed Care – PPO | Admitting: Family

## 2018-05-30 DIAGNOSIS — R059 Cough, unspecified: Secondary | ICD-10-CM

## 2018-05-30 DIAGNOSIS — R05 Cough: Secondary | ICD-10-CM | POA: Diagnosis not present

## 2018-05-30 DIAGNOSIS — B9689 Other specified bacterial agents as the cause of diseases classified elsewhere: Secondary | ICD-10-CM

## 2018-05-30 DIAGNOSIS — J329 Chronic sinusitis, unspecified: Secondary | ICD-10-CM

## 2018-05-30 MED ORDER — AMOXICILLIN-POT CLAVULANATE 875-125 MG PO TABS: 1.0000 | ORAL_TABLET | Freq: Two times a day (BID) | ORAL | 0 refills | Status: DC

## 2018-05-30 MED ORDER — BENZONATATE 100 MG PO CAPS: 100.0000 mg | ORAL_CAPSULE | Freq: Three times a day (TID) | ORAL | 0 refills | Status: DC | PRN

## 2018-05-30 NOTE — Progress Notes (Signed)
Greater than 5 minutes, yet less than 10 minutes of time have been spent researching, coordinating, and implementing care for this patient today.  Thank you for the details you included in the comment boxes. Those details are very helpful in determining the best course of treatment for you and help [place] to provide the best care.  We are sorry that you are not feeling well.  Here is how we plan to help!  Based on what you have shared with me it looks like you have sinusitis.  Sinusitis is inflammation and infection in the sinus cavities of the head.  Based on your presentation I believe you most likely have Acute Bacterial Sinusitis.  This is an infection caused by bacteria and is treated with antibiotics. I have prescribed Augmentin 875mg /125mg  one tablet twice daily with food, for 10 days. You may use an oral decongestant such as Mucinex D or if you have glaucoma or high blood pressure use plain Mucinex. Saline nasal spray help and can safely be used as often as needed for congestion.  If you develop worsening sinus pain, fever or notice severe headache and vision changes, or if symptoms are not better after completion of antibiotic, please schedule an appointment with a health care provider.    I have also sent Tessalon Perles 100mg , take 1-2 every 8 hours as needed for cough.   Sinus infections are not as easily transmitted as other respiratory infection, however we still recommend that you avoid close contact with loved ones, especially the very young and elderly.  Remember to wash your hands thoroughly throughout the day as this is the number one way to prevent the spread of infection!  Home Care:  Only take medications as instructed by your medical team.  Complete the entire course of an antibiotic.  Do not take these medications with alcohol.  A steam or ultrasonic humidifier can help congestion.  You can place a towel over your head and breathe in the steam from hot water coming from a  faucet.  Avoid close contacts especially the very young and the elderly.  Cover your mouth when you cough or sneeze.  Always remember to wash your hands.  Get Help Right Away If:  You develop worsening fever or sinus pain.  You develop a severe head ache or visual changes.  Your symptoms persist after you have completed your treatment plan.  Make sure you  Understand these instructions.  Will watch your condition.  Will get help right away if you are not doing well or get worse.  Your e-visit answers were reviewed by a board certified advanced clinical practitioner to complete your personal care plan.  Depending on the condition, your plan could have included both over the counter or prescription medications.  If there is a problem please reply  once you have received a response from your provider.  Your safety is important to [place].  If you have drug allergies check your prescription carefully.    You can use MyChart to ask questions about today's visit, request a non-urgent call back, or ask for a work or school excuse for 24 hours related to this e-Visit. If it has been greater than 24 hours you will need to follow up with your provider, or enter a new e-Visit to address those concerns.  You will get an e-mail in the next two days asking about your experience.  I hope that your e-visit has been valuable and will speed your recovery. Thank you for  using e-visits.    

## 2018-06-13 ENCOUNTER — Telehealth: Payer: BC Managed Care – PPO | Admitting: Family

## 2018-06-13 DIAGNOSIS — L0291 Cutaneous abscess, unspecified: Secondary | ICD-10-CM

## 2018-06-13 NOTE — Progress Notes (Signed)
Based on what you shared with me it looks like you have a condition that should be evaluated in a face to face office visit. You may need to have the abscess drained   NOTE: If you entered your credit card information for this eVisit, you will not be charged. You may see a "hold" on your card for the $30 but that hold will drop off and you will not have a charge processed.  If you are having a true medical emergency please call 911.  If you need an urgent face to face visit, [place] has four urgent care centers for your convenience.  If you need care fast and have a high deductible or no insurance consider:   [email or web address] to reserve your spot online an avoid wait times  [facility] [address] [place], [place] [address] 8 am to 8 pm Monday-Friday 10 am to 4 pm Saturday-Sunday *Across the street from [employer]  [address] [place], [address] 8 am to 5 pm Monday-Friday * In the [facility] on the [facility]   The following sites will take your  insurance:  . [facility]  [phone] Get Driving Directions Find a Provider at this Location  [address] [place], [place] [address] . 10 am to 8 pm Monday-Friday . 12 pm to 8 pm Saturday-Sunday   . [facility] at [facility]  [phone] Get Driving Directions Find a Provider at this Location  [address], [address] [place], [place] [address] . 8 am to 8 pm Monday-Friday . 9 am to 6 pm Saturday . 11 am to 6 pm Sunday   . [facility] at [facility]  [phone] Get Driving Directions  [address] [place], [place] [address] . 8 am to 8 pm Monday-Friday . 8 am to 4 pm Saturday-Sunday   Your e-visit answers were reviewed by a board certified advanced clinical practitioner to complete your personal care plan.  Thank you for using e-Visits.

## 2018-06-16 ENCOUNTER — Ambulatory Visit: Payer: BC Managed Care – PPO | Admitting: Family Medicine

## 2018-06-16 ENCOUNTER — Encounter: Payer: Self-pay | Admitting: Family Medicine

## 2018-06-16 VITALS — BP 122/78 | HR 90 | Temp 99.0°F | Resp 18 | Ht 68.0 in | Wt 238.0 lb

## 2018-06-16 DIAGNOSIS — T300 Burn of unspecified body region, unspecified degree: Secondary | ICD-10-CM

## 2018-06-16 DIAGNOSIS — B373 Candidiasis of vulva and vagina: Secondary | ICD-10-CM | POA: Diagnosis not present

## 2018-06-16 DIAGNOSIS — L02219 Cutaneous abscess of trunk, unspecified: Secondary | ICD-10-CM | POA: Diagnosis not present

## 2018-06-16 DIAGNOSIS — B3731 Acute candidiasis of vulva and vagina: Secondary | ICD-10-CM

## 2018-06-16 MED ORDER — SILVER SULFADIAZINE 1 % EX CREA: 1.0000 "application " | TOPICAL_CREAM | Freq: Every day | CUTANEOUS | 0 refills | Status: DC

## 2018-06-16 MED ORDER — FLUCONAZOLE 150 MG PO TABS: 150.0000 mg | ORAL_TABLET | Freq: Once | ORAL | 0 refills | Status: AC

## 2018-06-16 MED ORDER — SULFAMETHOXAZOLE-TRIMETHOPRIM 800-160 MG PO TABS: 1.0000 | ORAL_TABLET | Freq: Two times a day (BID) | ORAL | 0 refills | Status: DC

## 2018-06-16 NOTE — Patient Instructions (Signed)
Take probiotic daily with bactrim and for at least 2 weeks after medication course  If area not improving or re-occurs let us know and we can plan to refer to dermatology or surgery   Skin Abscess  A skin abscess is an infected area of your skin that contains pus and other material. An abscess can happen in any part of your body. Some abscesses break open (rupture) on their own. Most continue to get worse unless they are treated. The infection can spread deeper into the body and into your blood, which can make you feel sick. A skin abscess is caused by germs that enter the skin through a cut or scrape. It can also be caused by blocked oil and sweat glands or infected hair follicles. This condition is usually treated by:  Draining the pus.  Taking antibiotic medicines.  Placing a warm, wet washcloth over the abscess. Follow these instructions at home: Medicines   Take over-the-counter and prescription medicines only as told by your doctor.  If you were prescribed an antibiotic medicine, take it as told by your doctor. Do not stop taking the antibiotic even if you start to feel better. Abscess care   If you have an abscess that has not drained, place a warm, clean, wet washcloth over the abscess several times a day. Do this as told by your doctor.  Follow instructions from your doctor about how to take care of your abscess. Make sure you: ? Cover the abscess with a bandage (dressing). ? Change your bandage or gauze as told by your doctor. ? Wash your hands with soap and water before you change the bandage or gauze. If you cannot use soap and water, use hand sanitizer.  Check your abscess every day for signs that the infection is getting worse. Check for: ? More redness, swelling, or pain. ? More fluid or blood. ? Warmth. ? More pus or a bad smell. General instructions  To avoid spreading the infection: ? Do not share personal care items, towels, or hot tubs with  others. ? Avoid making skin-to-skin contact with other people.  Keep all follow-up visits as told by your doctor. This is important. Contact a doctor if:  You have more redness, swelling, or pain around your abscess.  You have more fluid or blood coming from your abscess.  Your abscess feels warm when you touch it.  You have more pus or a bad smell coming from your abscess.  You have a fever.  Your muscles ache.  You have chills.  You feel sick. Get help right away if:  You have very bad (severe) pain.  You see red streaks on your skin spreading away from the abscess. Summary  A skin abscess is an infected area of your skin that contains pus and other material.  The abscess is caused by germs that enter the skin through a cut or scrape. It can also be caused by blocked oil and sweat glands or infected hair follicles.  Follow your doctor's instructions on caring for your abscess, taking medicines, preventing infections, and keeping follow-up visits. This information is not intended to replace advice given to you by your health care provider. Make sure you discuss any questions you have with your health care provider. Document Released: 09/26/2007 Document Revised: 05/23/2017 Document Reviewed: 05/23/2017 Elsevier Interactive Patient Education  2019 [employer].

## 2018-06-16 NOTE — Progress Notes (Signed)
Subjective:    Patient ID: Madeline Strickland, female    DOB: 03-07-1971, 48 y.o.   MRN: [id number]  HPI   Patient presents to clinic due to an abscess under left breast.  States the area popped up about a week ago, she has been trying to do warm compresses on area which did not seem to be helping.  Last time she did a warm compress, the compresses to hot and actually burned her left breast skin slightly.  Area is not open or draining.  Denies fever or chills.  Denies body aches.  Denies any spreading redness.  Patient Active Problem List   Diagnosis Date Noted  . Carotid artery calcification, bilateral 12/21/2017  . Mass of upper outer quadrant of left breast 06/18/2017  . Leukocytosis 06/05/2017  . Flu-like symptoms 05/22/2016  . Anxiety state 07/20/2015  . Fatigue due to sleep pattern disturbance 03/03/2015  . Vitamin B12 deficiency anemia due to intrinsic factor deficiency 03/03/2015  . Vitamin D deficiency 03/03/2015  . Otitis externa, eczematoid 10/24/2014  . Hyperlipidemia associated with type 2 diabetes mellitus (HCC) 07/24/2012  . Type II diabetes mellitus, uncontrolled (HCC) 04/28/2012  . Screening for breast cancer 03/28/2011  . Chronic back pain greater than 3 months duration   . Screening for cervical cancer 03/27/2011  . Tobacco abuse   . Intrinsic asthma   . Hypertension   . Polycystic ovarian syndrome   . Hypothyroidism due to acquired atrophy of thyroid   . Obstructive sleep apnea 12/13/2010  . Obesity (BMI 30-39.9) 12/13/2010   Social History   Tobacco Use  . Smoking status: Current Every Day Smoker    Types: Cigarettes  . Smokeless tobacco: Never Used  . Tobacco comment: smokes 1-2 cigs a day  Substance Use Topics  . Alcohol use: No   Review of Systems  Constitutional: Negative for chills, fatigue and fever.  HENT: Negative for congestion, ear pain, sinus pain and sore throat.   Eyes: Negative.   Respiratory: Negative for cough, shortness of breath and  wheezing.   Cardiovascular: Negative for chest pain, palpitations and leg swelling.  Gastrointestinal: Negative for abdominal pain, diarrhea, nausea and vomiting.  Genitourinary: Negative for dysuria, frequency and urgency.  Musculoskeletal: Negative for arthralgias and myalgias.  Skin: +abscess under left breast. +burn on skin of left breast Neurological: Negative for syncope, light-headedness and headaches.  Psychiatric/Behavioral: The patient is not nervous/anxious.       Objective:   Physical Exam Vitals signs and nursing note reviewed.  Constitutional:      General: She is not in acute distress. HENT:     Head: Normocephalic and atraumatic.  Neck:     Musculoskeletal: Normal range of motion and neck supple. No neck rigidity.  Cardiovascular:     Rate and Rhythm: Normal rate and regular rhythm.  Pulmonary:     Effort: Pulmonary effort is normal. No respiratory distress.     Breath sounds: Normal breath sounds.  Chest:       Comments: Small skin burn area of the left breast is represented by red circle on diagram.  It is a mild second-degree burn approximately the size of a quarter.  Abscess/cyst area represented by blue circle on diagram.  It is approximately the size of a silver dollar and is raised.  No whitehead seen.  It is not open or draining.  Raised abscess area is tender. Skin:    General: Skin is warm and dry.  Neurological:  Mental Status: She is alert and oriented to person, place, and time.  Psychiatric:        Mood and Affect: Mood normal.        Behavior: Behavior normal.     Vitals:   06/16/18 0840  BP: 122/78  Pulse: 90  Resp: 18  Temp: 99 F (37.2 C)  SpO2: 98%      Assessment & Plan:   Cutaneous abscess of trunk - patient will take Bactrim twice daily for 10 days.  Advised she may use warm compresses on area, but to make sure that the compresses not too hot.  Advised to monitor area for becoming open or draining.  If area does not improve or  it returns, we discussed possibility of referral to dermatology.  Burn on skin - mild second-degree burn on left breast related to use of warm compress.  Patient will put silver Silvadene cream on area once daily for the next week.  Vaginal Candida - patient states she often gets a vaginal yeast infection with use of antibiotics.  Advised to take an oral probiotic daily with antibiotic and Diflucan dose scented to use if needed for development of vaginal yeast infection.  Patient will keep regularly scheduled follow-up with PCP as planned.  Advised that if it is not improving in the next 7 to 10 days to call office and we will put in referral for general surgery/dermatology.  Also advised areas worsening to call office right away and let us know.

## 2018-06-19 ENCOUNTER — Telehealth: Payer: Self-pay | Admitting: Family Medicine

## 2018-06-19 NOTE — Telephone Encounter (Signed)
Work note in epic

## 2018-06-23 ENCOUNTER — Other Ambulatory Visit: Payer: Self-pay | Admitting: Internal Medicine

## 2018-06-24 ENCOUNTER — Other Ambulatory Visit: Payer: Self-pay

## 2018-06-24 MED ORDER — INSULIN ASPART PROT & ASPART (70-30 MIX) 100 UNIT/ML PEN: PEN_INJECTOR | SUBCUTANEOUS | 0 refills | Status: DC

## 2018-07-01 ENCOUNTER — Encounter: Payer: Self-pay | Admitting: Family Medicine

## 2018-07-04 NOTE — Telephone Encounter (Signed)
I am not sure how to respond to this  LG

## 2018-07-14 ENCOUNTER — Ambulatory Visit: Payer: BC Managed Care – PPO | Admitting: Internal Medicine

## 2018-07-17 ENCOUNTER — Telehealth: Payer: Self-pay | Admitting: Internal Medicine

## 2018-07-18 ENCOUNTER — Telehealth: Payer: Self-pay | Admitting: Internal Medicine

## 2018-07-18 ENCOUNTER — Ambulatory Visit (INDEPENDENT_AMBULATORY_CARE_PROVIDER_SITE_OTHER): Payer: BC Managed Care – PPO | Admitting: Internal Medicine

## 2018-07-18 DIAGNOSIS — E669 Obesity, unspecified: Secondary | ICD-10-CM | POA: Diagnosis not present

## 2018-07-18 DIAGNOSIS — I10 Essential (primary) hypertension: Secondary | ICD-10-CM

## 2018-07-18 DIAGNOSIS — D72823 Leukemoid reaction: Secondary | ICD-10-CM

## 2018-07-18 DIAGNOSIS — J45909 Unspecified asthma, uncomplicated: Secondary | ICD-10-CM

## 2018-07-18 DIAGNOSIS — F411 Generalized anxiety disorder: Secondary | ICD-10-CM

## 2018-07-18 DIAGNOSIS — E034 Atrophy of thyroid (acquired): Secondary | ICD-10-CM | POA: Diagnosis not present

## 2018-07-18 DIAGNOSIS — D510 Vitamin B12 deficiency anemia due to intrinsic factor deficiency: Secondary | ICD-10-CM

## 2018-07-18 DIAGNOSIS — E1165 Type 2 diabetes mellitus with hyperglycemia: Secondary | ICD-10-CM

## 2018-07-18 DIAGNOSIS — E611 Iron deficiency: Secondary | ICD-10-CM

## 2018-07-18 MED ORDER — SERTRALINE HCL 50 MG PO TABS: 50.0000 mg | ORAL_TABLET | Freq: Every day | ORAL | 1 refills | Status: DC

## 2018-07-18 MED ORDER — ALPRAZOLAM 0.5 MG PO TABS: ORAL_TABLET | ORAL | 3 refills | Status: DC

## 2018-07-18 NOTE — Patient Instructions (Signed)
Start checking BS before your lunch meal  Continue 26 units insulin in the evening  Starting generic zoloft for anxiety.  1/2 tablet daily after dinner.  If tolerated, increase dose to 1 tablet daily after one week  Make appt for fasting labs in one month
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
## 2018-07-19 ENCOUNTER — Other Ambulatory Visit: Payer: Self-pay | Admitting: Internal Medicine

## 2018-07-20 ENCOUNTER — Encounter: Payer: Self-pay | Admitting: Internal Medicine

## 2018-07-20 NOTE — Assessment & Plan Note (Signed)
Aggravated by the COVID 19 pandemic.  She has agreed to a trial of sertraline to reduce her use of benzodiazepines from bid to prn

## 2018-07-20 NOTE — Assessment & Plan Note (Signed)
Uncontrolled due to dietary and medical nonadherence.  currenlty taking 26 units of mixed isulin in the evenig

## 2018-07-20 NOTE — Assessment & Plan Note (Signed)
Diagnosed nearly ten years ago,  Aggravated by environmental irritants and personal habit of smoking. She is at increased personal risk or severe morbidity should she become infected.  I have advised her to work from home when possible and limit all contact with the outside for the duration of the epidemic

## 2018-07-20 NOTE — Assessment & Plan Note (Signed)
Thyroid function was overactive on prior  dose of 225 mcg.  I reduced  dose to 200 mcg levothyroxine over a year ago and she has not had TSH rechecked..   Lab Results  Component Value Date   TSH 0.04 (L) 06/03/2017    

## 2018-07-24 ENCOUNTER — Other Ambulatory Visit: Payer: Self-pay | Admitting: Internal Medicine

## 2018-08-01 ENCOUNTER — Other Ambulatory Visit: Payer: Self-pay | Admitting: Internal Medicine

## 2018-08-11 ENCOUNTER — Other Ambulatory Visit: Payer: Self-pay

## 2018-08-11 MED ORDER — INSULIN ASPART PROT & ASPART (70-30 MIX) 100 UNIT/ML PEN: PEN_INJECTOR | SUBCUTANEOUS | 2 refills | Status: DC

## 2018-08-25 ENCOUNTER — Other Ambulatory Visit: Payer: Self-pay

## 2018-08-25 MED ORDER — FLUTICASONE-SALMETEROL 250-50 MCG/DOSE IN AEPB: INHALATION_SPRAY | RESPIRATORY_TRACT | 2 refills | Status: DC

## 2018-09-23 ENCOUNTER — Other Ambulatory Visit: Payer: Self-pay | Admitting: Internal Medicine

## 2018-11-10 ENCOUNTER — Other Ambulatory Visit: Payer: Self-pay

## 2018-11-10 MED ORDER — BD PEN NEEDLE MINI U/F 31G X 5 MM MISC: 2 refills | Status: DC

## 2018-11-21 ENCOUNTER — Other Ambulatory Visit: Payer: Self-pay

## 2018-11-21 MED ORDER — METOPROLOL SUCCINATE ER 50 MG PO TB24: ORAL_TABLET | ORAL | 1 refills | Status: DC

## 2018-12-24 ENCOUNTER — Other Ambulatory Visit: Payer: Self-pay | Admitting: Internal Medicine

## 2018-12-24 MED ORDER — SPIRONOLACTONE 100 MG PO TABS: ORAL_TABLET | ORAL | 1 refills | Status: DC

## 2018-12-24 NOTE — Telephone Encounter (Signed)
Requested medication (s) are due for refill today:yes  Requested medication (s) are on the active medication list: yes  Last refill:   Future visit scheduled: no  Notes to clinic: review for review   Requested Prescriptions  Pending Prescriptions Disp Refills   spironolactone (ALDACTONE) 100 MG tablet 180 tablet 1     Cardiovascular: Diuretics - Aldosterone Antagonist Failed - 12/24/2018 10:32 AM      Failed - Cr in normal range and within 360 days    Creat  Date Value Ref Range Status  10/22/2014 1.01 0.50 - 1.10 mg/dL Final   Creatinine, Ser  Date Value Ref Range Status  12/19/2017 1.19 0.40 - 1.20 mg/dL Final         Failed - K in normal range and within 360 days    Potassium  Date Value Ref Range Status  12/19/2017 4.4 3.5 - 5.1 mEq/L Final         Failed - Na in normal range and within 360 days    Sodium  Date Value Ref Range Status  12/19/2017 137 135 - 145 mEq/L Final         Passed - Last BP in normal range    BP Readings from Last 1 Encounters:  06/16/18 122/78         Passed - Valid encounter within last 6 months    Recent Outpatient Visits          5 months ago Intrinsic asthma   [facility] [place] Crecencio Mc, MD   6 months ago Cutaneous abscess of trunk, unspecified site of trunk   [facility] [place], Jacquelynn Cree, FNP   10 months ago Acute bronchitis with asthma with acute exacerbation   [facility] [place], Jacquelynn Cree, FNP   1 year ago Uncontrolled type 2 diabetes mellitus with hypoglycemia without coma [facility])   [facility] [place] Crecencio Mc, MD   1 year ago Other elevated white blood cell (WBC) count   [facility]  Crecencio Mc, MD

## 2018-12-24 NOTE — Telephone Encounter (Signed)
Copied from [place] [id number]. Topic: Quick Communication - Rx Refill/Question >> Dec 24, 2018 10:25 AM Rainey Pines A wrote: Medication: spironolactone (ALDACTONE) 100 MG tablet (Patient stated that pharmacy has reached out multiple times to get medication signed off on ans sent back.)  Has the patient contacted their pharmacy? {Yes (Agent: If no, request that the patient contact the pharmacy for the refill.) (Agent: If yes, when and what did the pharmacy advise?)Contact PCP  Preferred Pharmacy (with phone number or street name):  [id number] - [place], [address] [place] [phone] (Phone) [phone] (Fax)   Agent: Please be advised that RX refills may take up to 3 business days. We ask that you follow-up with your pharmacy.

## 2019-01-27 ENCOUNTER — Other Ambulatory Visit: Payer: Self-pay

## 2019-01-27 MED ORDER — MONTELUKAST SODIUM 10 MG PO TABS: 10.0000 mg | ORAL_TABLET | Freq: Every day | ORAL | 1 refills | Status: DC

## 2019-01-27 MED ORDER — NOVOLOG MIX 70/30 FLEXPEN (70-30) 100 UNIT/ML ~~LOC~~ SUPN: PEN_INJECTOR | SUBCUTANEOUS | 2 refills | Status: DC

## 2019-01-27 NOTE — Telephone Encounter (Signed)
Refilled: 07/18/2018 Last OV: 07/18/2018 Next OV: not scheduled

## 2019-01-27 NOTE — Telephone Encounter (Signed)
Levothyroxine  Refilled: 08/04/2018  Last OV: 07/18/2018  Next OV: not scheduled  last TSH: 06/03/2017

## 2019-01-28 MED ORDER — ALPRAZOLAM 0.5 MG PO TABS: ORAL_TABLET | ORAL | 0 refills | Status: DC

## 2019-01-28 MED ORDER — LEVOTHYROXINE SODIUM 200 MCG PO TABS: 200.0000 ug | ORAL_TABLET | Freq: Every day | ORAL | 0 refills | Status: DC

## 2019-01-28 NOTE — Telephone Encounter (Signed)
Refill for 30 days only.  laband FICE VISIT NEEDED prior to any more refills

## 2019-02-18 ENCOUNTER — Ambulatory Visit (INDEPENDENT_AMBULATORY_CARE_PROVIDER_SITE_OTHER): Payer: BC Managed Care – PPO | Admitting: Internal Medicine

## 2019-02-18 ENCOUNTER — Other Ambulatory Visit: Payer: Self-pay

## 2019-02-18 ENCOUNTER — Encounter: Payer: Self-pay | Admitting: Internal Medicine

## 2019-02-18 DIAGNOSIS — F4001 Agoraphobia with panic disorder: Secondary | ICD-10-CM

## 2019-02-18 DIAGNOSIS — J45909 Unspecified asthma, uncomplicated: Secondary | ICD-10-CM

## 2019-02-18 MED ORDER — SERTRALINE HCL 50 MG PO TABS: 50.0000 mg | ORAL_TABLET | Freq: Every day | ORAL | 0 refills | Status: DC

## 2019-02-18 MED ORDER — BUSPIRONE HCL 7.5 MG PO TABS: 7.5000 mg | ORAL_TABLET | Freq: Three times a day (TID) | ORAL | 0 refills | Status: DC
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
## 2019-02-19 DIAGNOSIS — F4001 Agoraphobia with panic disorder: Secondary | ICD-10-CM | POA: Insufficient documentation

## 2019-02-19 NOTE — Assessment & Plan Note (Signed)
She has had no exacerbations in 6 months ,  Since isolating herself at home,

## 2019-02-19 NOTE — Assessment & Plan Note (Signed)
Remains uncontrolled with no recent A1c ,  Advised to use Mychart to send weekly readings to guide   Increases in mixed insulin doses

## 2019-02-19 NOTE — Assessment & Plan Note (Signed)
encouraged to start zoloft ,  And.  add buspirone for anxiety .  Follow up one month

## 2019-04-01 ENCOUNTER — Other Ambulatory Visit: Payer: Self-pay

## 2019-04-02 MED ORDER — LEVOTHYROXINE SODIUM 200 MCG PO TABS: 200.0000 ug | ORAL_TABLET | Freq: Every day | ORAL | 0 refills | Status: DC

## 2019-04-02 MED ORDER — ALPRAZOLAM 0.5 MG PO TABS: ORAL_TABLET | ORAL | 1 refills | Status: DC

## 2019-04-02 NOTE — Telephone Encounter (Signed)
Last OV: 02/18/2019 Next OV: not scheduled Last Labs: 12/19/2017  Alprazolam    Refilled: 01/28/2019 Levothyroxine   Refilled: 01/28/2019

## 2019-04-06 ENCOUNTER — Other Ambulatory Visit: Payer: Self-pay

## 2019-04-06 MED ORDER — SERTRALINE HCL 50 MG PO TABS: 50.0000 mg | ORAL_TABLET | Freq: Every day | ORAL | 0 refills | Status: DC

## 2019-04-06 MED ORDER — BUSPIRONE HCL 7.5 MG PO TABS: 7.5000 mg | ORAL_TABLET | Freq: Three times a day (TID) | ORAL | 0 refills | Status: DC

## 2019-04-08 ENCOUNTER — Other Ambulatory Visit: Payer: Self-pay | Admitting: Internal Medicine

## 2019-04-30 ENCOUNTER — Other Ambulatory Visit: Payer: Self-pay

## 2019-04-30 MED ORDER — ONETOUCH VERIO VI STRP: ORAL_STRIP | 2 refills | Status: DC

## 2019-04-30 MED ORDER — FAMOTIDINE 20 MG PO TABS: ORAL_TABLET | ORAL | 1 refills | Status: DC

## 2019-05-21 ENCOUNTER — Other Ambulatory Visit: Payer: Self-pay

## 2019-05-21 MED ORDER — LEVOTHYROXINE SODIUM 200 MCG PO TABS: 200.0000 ug | ORAL_TABLET | Freq: Every day | ORAL | 0 refills | Status: DC

## 2019-05-27 ENCOUNTER — Other Ambulatory Visit: Payer: Self-pay

## 2019-05-27 ENCOUNTER — Telehealth: Payer: Self-pay

## 2019-05-27 MED ORDER — METOPROLOL SUCCINATE ER 50 MG PO TB24: ORAL_TABLET | ORAL | 1 refills | Status: DC

## 2019-05-27 NOTE — Telephone Encounter (Signed)
error 

## 2019-06-26 ENCOUNTER — Other Ambulatory Visit: Payer: Self-pay

## 2019-06-26 MED ORDER — SPIRONOLACTONE 100 MG PO TABS: ORAL_TABLET | ORAL | 1 refills | Status: DC

## 2019-06-26 MED ORDER — LEVOTHYROXINE SODIUM 200 MCG PO TABS: 200.0000 ug | ORAL_TABLET | Freq: Every day | ORAL | 0 refills | Status: DC

## 2019-06-26 NOTE — Telephone Encounter (Signed)
Refilled: 12/24/2018 Last OV: 02/18/2019 Next OV: not scheduled Has not came in to have lab work done yet.

## 2019-07-29 ENCOUNTER — Other Ambulatory Visit: Payer: Self-pay

## 2019-07-29 MED ORDER — LEVOTHYROXINE SODIUM 200 MCG PO TABS: 200.0000 ug | ORAL_TABLET | Freq: Every day | ORAL | 0 refills | Status: DC

## 2019-07-31 ENCOUNTER — Other Ambulatory Visit: Payer: Self-pay

## 2019-07-31 MED ORDER — LEVOTHYROXINE SODIUM 200 MCG PO TABS: 200.0000 ug | ORAL_TABLET | Freq: Every day | ORAL | 0 refills | Status: DC

## 2019-07-31 MED ORDER — MONTELUKAST SODIUM 10 MG PO TABS: 10.0000 mg | ORAL_TABLET | Freq: Every day | ORAL | 1 refills | Status: DC

## 2019-08-12 ENCOUNTER — Other Ambulatory Visit: Payer: Self-pay | Admitting: Internal Medicine

## 2019-08-12 ENCOUNTER — Telehealth: Payer: Self-pay | Admitting: Internal Medicine

## 2019-08-12 NOTE — Telephone Encounter (Signed)
Refill request for xanax, last seen 02/18/19, last filled 04/02/2019.  Please advise.

## 2019-08-12 NOTE — Telephone Encounter (Signed)
Mychart sent to inform patient. 

## 2019-08-12 NOTE — Telephone Encounter (Signed)
Refilled alprazolam  for 30 days only.  OFFICE VISIT NEEDED prior to any more refills

## 2019-09-10 ENCOUNTER — Telehealth (INDEPENDENT_AMBULATORY_CARE_PROVIDER_SITE_OTHER): Payer: BC Managed Care – PPO | Admitting: Internal Medicine

## 2019-09-10 ENCOUNTER — Encounter: Payer: Self-pay | Admitting: Internal Medicine

## 2019-09-10 VITALS — BP 145/85 | HR 82 | Ht 68.0 in | Wt 223.0 lb

## 2019-09-10 DIAGNOSIS — E1169 Type 2 diabetes mellitus with other specified complication: Secondary | ICD-10-CM | POA: Diagnosis not present

## 2019-09-10 DIAGNOSIS — E034 Atrophy of thyroid (acquired): Secondary | ICD-10-CM | POA: Diagnosis not present

## 2019-09-10 DIAGNOSIS — E785 Hyperlipidemia, unspecified: Secondary | ICD-10-CM

## 2019-09-10 DIAGNOSIS — I10 Essential (primary) hypertension: Secondary | ICD-10-CM

## 2019-09-10 DIAGNOSIS — J45909 Unspecified asthma, uncomplicated: Secondary | ICD-10-CM

## 2019-09-10 DIAGNOSIS — Z720 Tobacco use: Secondary | ICD-10-CM

## 2019-09-10 DIAGNOSIS — F4001 Agoraphobia with panic disorder: Secondary | ICD-10-CM

## 2019-09-10 DIAGNOSIS — E1165 Type 2 diabetes mellitus with hyperglycemia: Secondary | ICD-10-CM
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
## 2019-09-10 NOTE — Patient Instructions (Addendum)
Please send me readings of blood sugars in the early morning    Send me blood pressure readings 7 days worth,  So I can adjust your medications   STOP SMOKING!  THE SINGLE MOST IMPORTANT ACTION YOU CAN TAKE THAT WILL LOWER YOUR RISK OF HAVING A HEART ATTACK    Referral to Dr Maryruth Bun in progress TO MANAGE YOUR AGORAPHOBIA

## 2019-09-12 NOTE — Assessment & Plan Note (Addendum)
a1c Is likely > 8.0 given her 90 day average.  She has been aksed to send me fastings and 2 hr post prandials for the past week

## 2019-09-12 NOTE — Assessment & Plan Note (Signed)
Thyroid function was overactive on prior  dose of 225 mcg.  I reduced  dose to 200 mcg levothyroxine over a year ago and she has not had TSH rechecked..   Lab Results  Component Value Date   TSH 0.04 (L) 06/03/2017

## 2019-09-12 NOTE — Assessment & Plan Note (Signed)
Triggered by the COVID 19 PANDEMIC. Referral to Dr Maryruth Bun.  She has been unable to conquer her fear of new medication and so has not availed herself to sertraline or buspirone.  Alprazolam refilled.

## 2019-09-12 NOTE — Assessment & Plan Note (Signed)
No exacerbations since she has been self quarantining for the past year.  Advised to quit smoking

## 2019-09-12 NOTE — Assessment & Plan Note (Signed)
Not at goal due to being lost to follow up complicated by lack of renal function monitoring .  Marland Kitchen  She has been asked to check her  BP  at home and  submit readings for evaluation. Renal function, electrolytes and screen for proteinuria are all OVERDUE due to patient's agoraphobia  .

## 2019-09-12 NOTE — Assessment & Plan Note (Signed)
Treatment has been hindered by patient's lack of follow up as she has not left house since May 2020 or had labs since 2019

## 2019-09-12 NOTE — Assessment & Plan Note (Signed)
Spent 3 minutes discussing risk of continued tobacco abuse, including but not limited to CAD, PAD, hypertension, and CA. she is not interested in pharmacotherapy at this time. 

## 2019-09-18 DIAGNOSIS — F4001 Agoraphobia with panic disorder: Secondary | ICD-10-CM

## 2019-09-18 DIAGNOSIS — F411 Generalized anxiety disorder: Secondary | ICD-10-CM

## 2019-09-23 NOTE — Telephone Encounter (Signed)
I will reorder the psychiatry consult to a different group in [place] . congrats about going outside!!!!!!    "Be anxious for nothing,  But in everything , by prayer and supplication , let your request by made known to God. And the peace of God which surpasses all understanding, shall guard your hearts and minds through [employer]."  Regards,   Duncan Dull, MD

## 2019-09-29 ENCOUNTER — Other Ambulatory Visit: Payer: Self-pay

## 2019-09-29 IMAGING — DX DG CHEST 2V
2 series · 2 of 2 positions shown · non-contrast
Comparison: PA and lateral chest x-ray December 05, 2015

CLINICAL DATA: Cough for the past week has nearly completed
antibiotics.

EXAM:
CHEST - 2 VIEW

[chest pa]
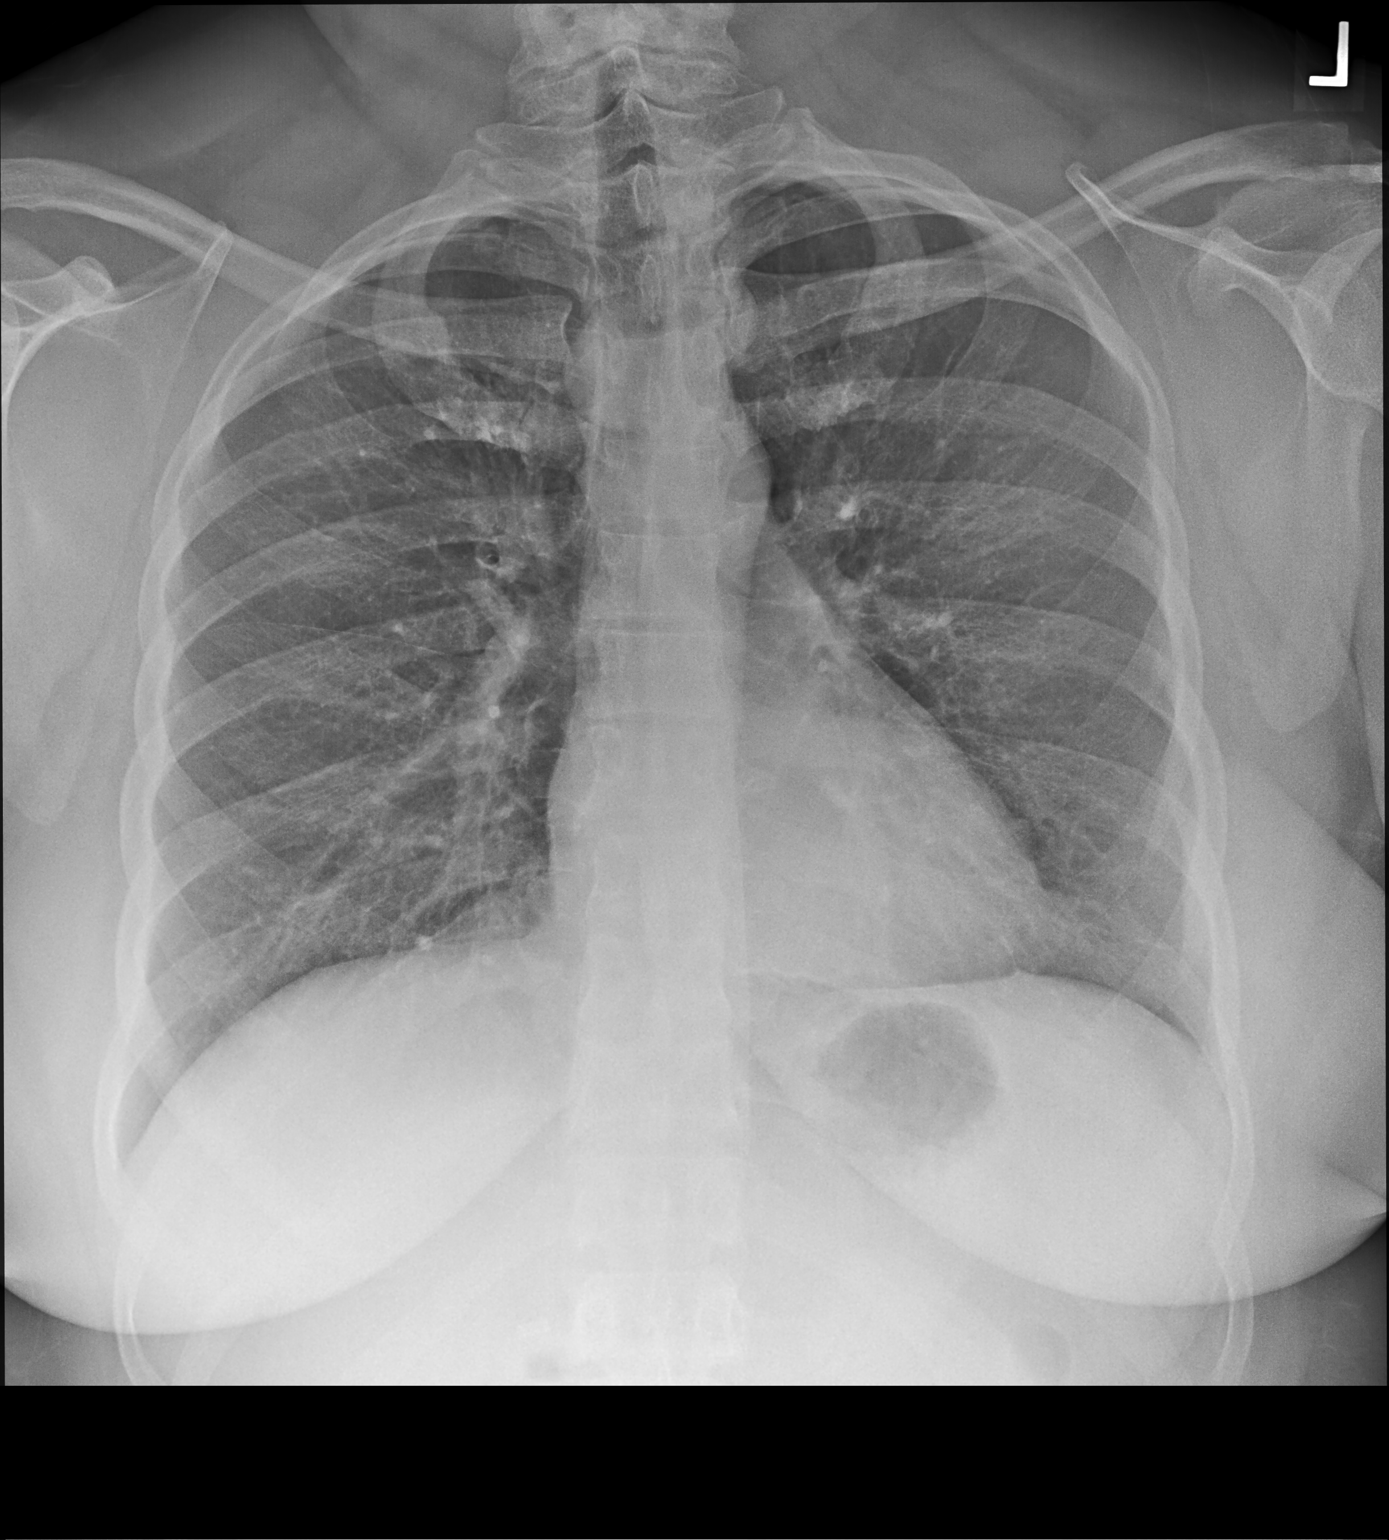

[chest lat]
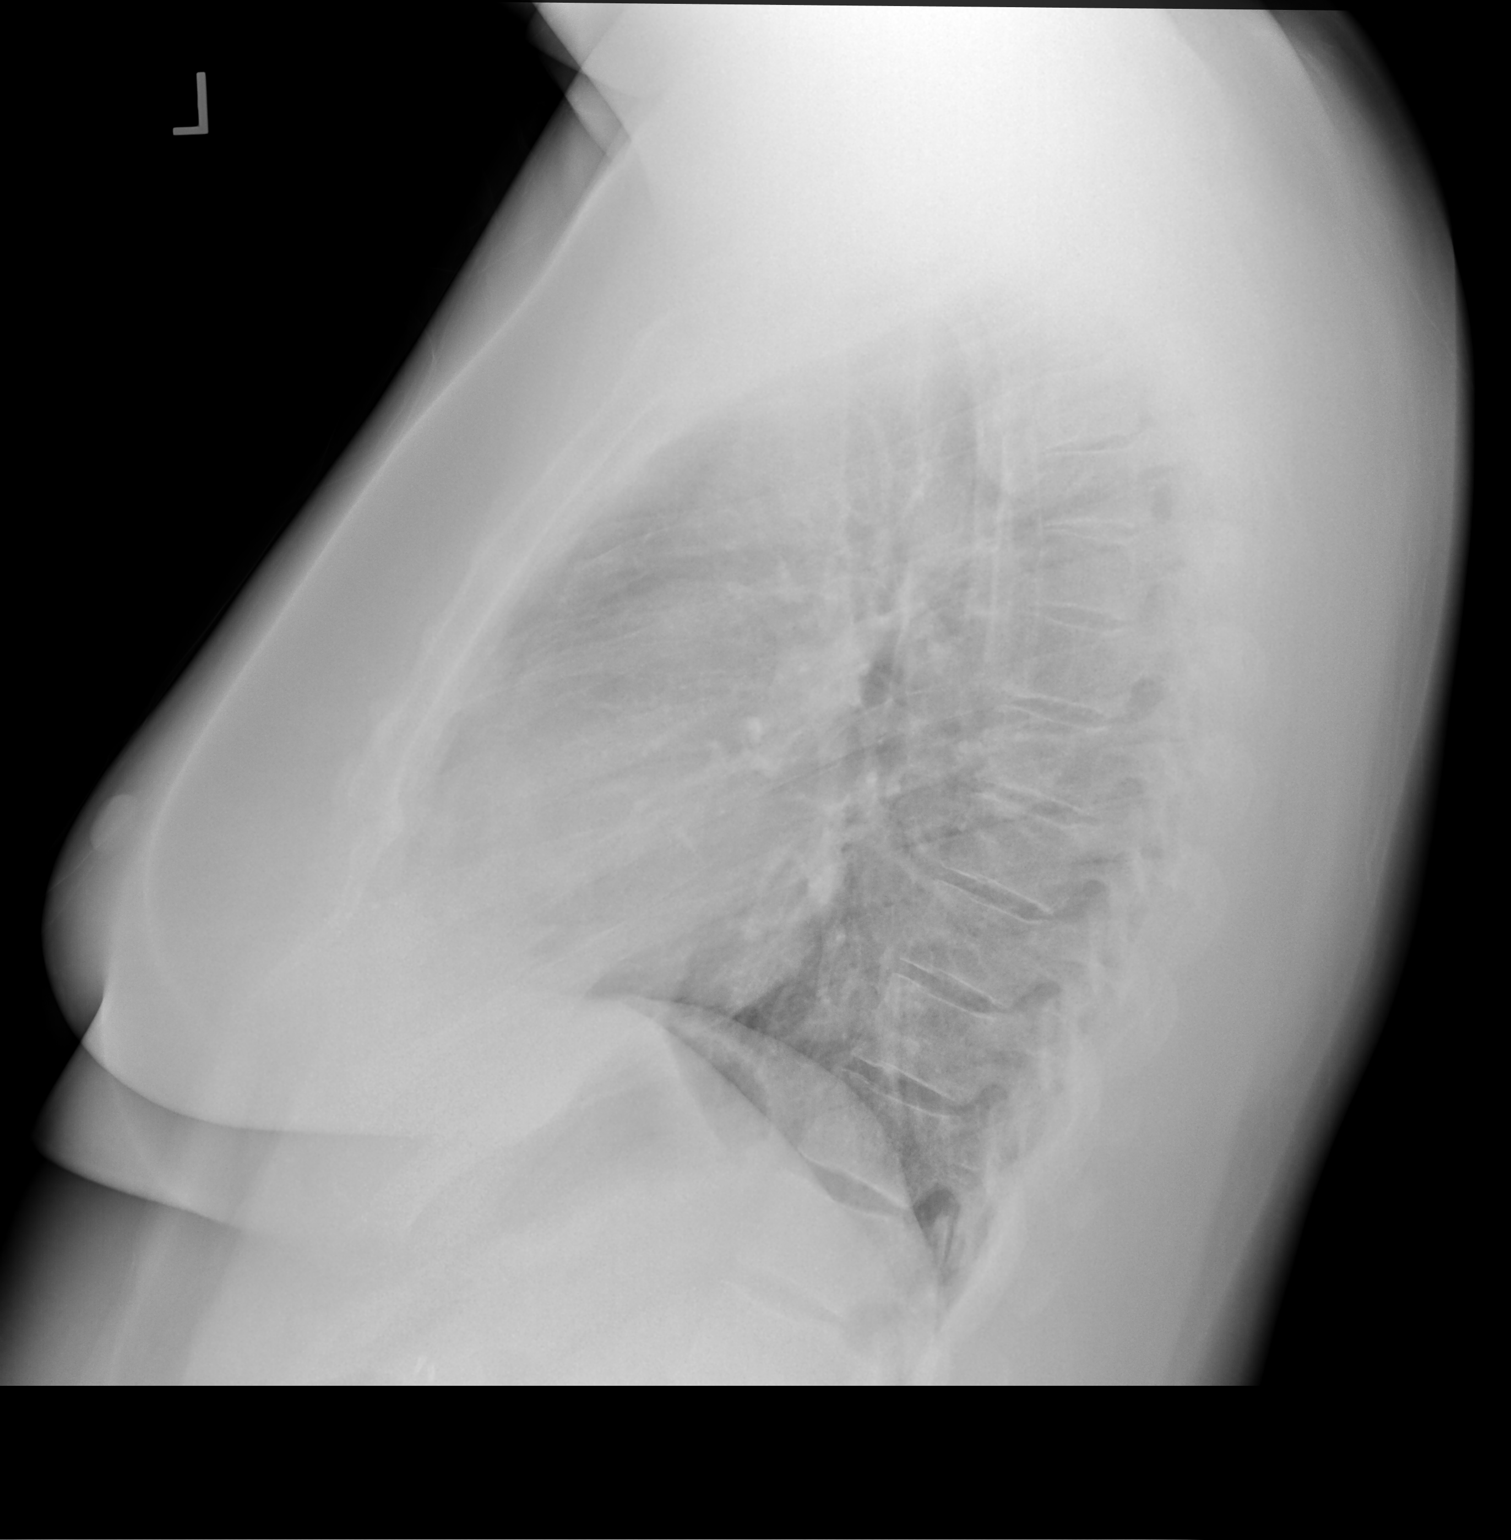

[2 of 2 positions shown; findings below may reference images not displayed]

FINDINGS: The lungs are adequately inflated. The interstitial markings are
coarse. There is no alveolar infiltrate or pleural effusion. The
mediastinum is normal in width. The heart and pulmonary vascularity
are normal. The bony thorax is unremarkable.
IMPRESSION: Mild chronic bronchitic-smoking related changes, stable. No
pneumonia nor other acute cardiopulmonary abnormality.

## 2019-09-29 MED ORDER — ALPRAZOLAM 0.5 MG PO TABS: ORAL_TABLET | ORAL | 5 refills | Status: DC

## 2019-09-29 MED ORDER — LEVOTHYROXINE SODIUM 200 MCG PO TABS: 200.0000 ug | ORAL_TABLET | Freq: Every day | ORAL | 0 refills | Status: DC

## 2019-09-29 NOTE — Telephone Encounter (Signed)
Refill request for xanax, last seen 09-10-19, last filled 08-12-19.  Please advise.

## 2019-10-02 ENCOUNTER — Telehealth: Payer: BC Managed Care – PPO | Admitting: Nurse Practitioner

## 2019-10-02 DIAGNOSIS — N390 Urinary tract infection, site not specified: Secondary | ICD-10-CM

## 2019-10-02 MED ORDER — CEPHALEXIN 500 MG PO CAPS: 500.0000 mg | ORAL_CAPSULE | Freq: Two times a day (BID) | ORAL | 0 refills | Status: AC

## 2019-10-02 NOTE — Progress Notes (Signed)

## 2019-10-30 ENCOUNTER — Other Ambulatory Visit: Payer: Self-pay

## 2019-11-02 ENCOUNTER — Telehealth: Payer: Self-pay | Admitting: Internal Medicine

## 2019-11-02 MED ORDER — "SYRINGE 25G X 1"" 3 ML MISC": 0 refills | Status: DC

## 2019-11-02 MED ORDER — LEVOTHYROXINE SODIUM 200 MCG PO TABS: 200.0000 ug | ORAL_TABLET | Freq: Every day | ORAL | 0 refills | Status: DC

## 2019-11-02 NOTE — Telephone Encounter (Signed)
Cicero Duck,    Dear Patria Mane:   I am sorry you missed the opportunity to set up an appointment with the second psychiatry referral I made. (You felt that July 29 was too long to wait for Dr Maryruth Bun) .  I received a message that you failed to return either of the calls from [facility] so they have closed the referral.  Please let me know what your plan Is to manage your agoraphobia   Regards,   Duncan Dull, MD

## 2019-11-04 ENCOUNTER — Other Ambulatory Visit: Payer: Self-pay

## 2019-11-04 MED ORDER — LEVOTHYROXINE SODIUM 200 MCG PO TABS: 200.0000 ug | ORAL_TABLET | Freq: Every day | ORAL | 0 refills | Status: DC

## 2019-11-04 NOTE — Telephone Encounter (Signed)
Pt has not had labs drawn since 2019.

## 2019-11-19 ENCOUNTER — Other Ambulatory Visit: Payer: Self-pay | Admitting: Internal Medicine

## 2019-12-17 ENCOUNTER — Telehealth: Payer: Self-pay | Admitting: Internal Medicine

## 2019-12-21 ENCOUNTER — Other Ambulatory Visit: Payer: Self-pay

## 2019-12-21 MED ORDER — SPIRONOLACTONE 100 MG PO TABS: ORAL_TABLET | ORAL | 0 refills | Status: DC

## 2019-12-23 ENCOUNTER — Other Ambulatory Visit: Payer: Self-pay | Admitting: Internal Medicine

## 2020-01-01 ENCOUNTER — Other Ambulatory Visit: Payer: Self-pay

## 2020-01-01 MED ORDER — FAMOTIDINE 20 MG PO TABS: ORAL_TABLET | ORAL | 0 refills | Status: DC

## 2020-01-10 ENCOUNTER — Other Ambulatory Visit: Payer: Self-pay | Admitting: Internal Medicine

## 2020-01-22 ENCOUNTER — Other Ambulatory Visit: Payer: Self-pay

## 2020-01-22 ENCOUNTER — Telehealth (INDEPENDENT_AMBULATORY_CARE_PROVIDER_SITE_OTHER): Payer: BC Managed Care – PPO | Admitting: Internal Medicine

## 2020-01-22 ENCOUNTER — Encounter: Payer: Self-pay | Admitting: Internal Medicine

## 2020-01-22 DIAGNOSIS — E1165 Type 2 diabetes mellitus with hyperglycemia: Secondary | ICD-10-CM | POA: Diagnosis not present

## 2020-01-22 DIAGNOSIS — J45909 Unspecified asthma, uncomplicated: Secondary | ICD-10-CM | POA: Diagnosis not present

## 2020-01-22 DIAGNOSIS — F4001 Agoraphobia with panic disorder: Secondary | ICD-10-CM | POA: Diagnosis not present

## 2020-01-22 MED ORDER — LEVOTHYROXINE SODIUM 200 MCG PO TABS
200.0000 ug | ORAL_TABLET | Freq: Every day | ORAL | 0 refills | Status: DC
Start: 2020-01-22 — End: 2020-05-02

## 2020-01-22 MED ORDER — MUPIROCIN 2 % EX OINT: 1.0000 "application " | TOPICAL_OINTMENT | Freq: Two times a day (BID) | CUTANEOUS | 0 refills | Status: DC

## 2020-01-22 MED ORDER — MOMETASONE FUROATE 0.1 % EX OINT: TOPICAL_OINTMENT | Freq: Every day | CUTANEOUS | 0 refills | Status: DC

## 2020-01-22 NOTE — Patient Instructions (Signed)
Here are the labs we should check in late October  TSH Comprehensive metabolic panel  (OR BASIC METABOLIC PANEL AND HEPATIC FUNCTION PANEL) URINE MICROALBUMIN/CREATIINE RATIO A1C LIPID    CHECK OUT THE OPTAVIA DIET
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
## 2020-01-24 NOTE — Assessment & Plan Note (Addendum)
She is overdue for testing due to agoraphobia preventing her from leaving her home for over one year.  All labs have been ordered.

## 2020-01-24 NOTE — Assessment & Plan Note (Signed)
She has had no exacerbations in over a year.

## 2020-01-24 NOTE — Assessment & Plan Note (Signed)
Now under treatment by Dr Maryruth Bun .  She has been able to leave the house to receive the COVID vaccinations.

## 2020-01-27 ENCOUNTER — Other Ambulatory Visit: Payer: Self-pay | Admitting: Internal Medicine

## 2020-03-19 ENCOUNTER — Other Ambulatory Visit: Payer: Self-pay | Admitting: Internal Medicine

## 2020-05-01 ENCOUNTER — Other Ambulatory Visit: Payer: Self-pay | Admitting: Internal Medicine

## 2020-05-09 ENCOUNTER — Encounter: Payer: Self-pay | Admitting: Internal Medicine

## 2020-05-09 ENCOUNTER — Telehealth (INDEPENDENT_AMBULATORY_CARE_PROVIDER_SITE_OTHER): Payer: BC Managed Care – PPO | Admitting: Internal Medicine

## 2020-05-09 DIAGNOSIS — E669 Obesity, unspecified: Secondary | ICD-10-CM

## 2020-05-09 DIAGNOSIS — Z720 Tobacco use: Secondary | ICD-10-CM

## 2020-05-09 DIAGNOSIS — E119 Type 2 diabetes mellitus without complications: Secondary | ICD-10-CM

## 2020-05-09 DIAGNOSIS — E559 Vitamin D deficiency, unspecified: Secondary | ICD-10-CM

## 2020-05-09 DIAGNOSIS — F4001 Agoraphobia with panic disorder: Secondary | ICD-10-CM | POA: Diagnosis not present

## 2020-05-09 DIAGNOSIS — U071 COVID-19: Secondary | ICD-10-CM

## 2020-05-09 DIAGNOSIS — Z8616 Personal history of COVID-19: Secondary | ICD-10-CM | POA: Insufficient documentation

## 2020-05-09 NOTE — Assessment & Plan Note (Signed)
Advised to reduce daily intake from 5000 Ius (since 2019)to 2500 Ius

## 2020-05-09 NOTE — Assessment & Plan Note (Signed)
Currently using 3-4 daily since the pandemic.  encouarged to try the patch she has obtained.  And contniue online tobacco cessation counselling

## 2020-05-09 NOTE — Assessment & Plan Note (Signed)
She feared COVID to the pt of becoming agorphobic.  She feels now that she has survived  A COVID infection she may be able to leave home to come to office for labs. Continue psychotherapy

## 2020-05-09 NOTE — Assessment & Plan Note (Signed)
Diagnosed by home test on Jan 2.  Improving gradually,  Case was mild due to prior vaccination

## 2020-05-09 NOTE — Assessment & Plan Note (Signed)
I have congratulated her in reduction of   BMI and encouraged  Continued weight loss with goal of 10% of body weigh over the next 6 months using a low glycemic index diet and regular exercise a minimum of 5 days per week when she has fully recovered from COVID

## 2020-05-09 NOTE — Progress Notes (Signed)
Patient is follow up from having covid-19. Patient stated that she is extremely fatigued and productive cough. She stated that the phlegm is clear to yellow in color. Patient is not taking any medications for the cough at this time.
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
## 2020-05-09 NOTE — Assessment & Plan Note (Signed)
She has currently achieved glycemic control without medications through weight loss

## 2020-06-13 ENCOUNTER — Telehealth: Payer: BC Managed Care – PPO | Admitting: Nurse Practitioner

## 2020-06-13 DIAGNOSIS — B372 Candidiasis of skin and nail: Secondary | ICD-10-CM

## 2020-06-13 MED ORDER — NYSTATIN 100000 UNIT/GM EX POWD
Freq: Two times a day (BID) | CUTANEOUS | 3 refills | Status: DC
Start: 2020-06-13 — End: 2022-10-23

## 2020-06-13 MED ORDER — FLUCONAZOLE 150 MG PO TABS: 150.0000 mg | ORAL_TABLET | Freq: Once | ORAL | 0 refills | Status: AC

## 2020-06-13 NOTE — Progress Notes (Signed)
E Visit for Rash  We are sorry that you are not feeling well. Here is how we plan to help!   ased in what you have shared with me,it sounds like yeast.     Based upon your presentation it appears you have a fungal infection.  I have prescribed: and Nystatin powder and diflucan 150mg  1 po now. apply to the affected area twice daily   HOME CARE:   Take cool showers and avoid direct sunlight.  Apply cool compress or wet dressings.  Take a bath in an oatmeal bath.  Sprinkle content of one Aveeno packet under running faucet with comfortably warm water.  Bathe for 15-20 minutes, 1-2 times daily.  Pat dry with a towel. Do not rub the rash.  Use hydrocortisone cream.  Take an antihistamine like Benadryl for widespread rashes that itch.  The adult dose of Benadryl is 25-50 mg by mouth 4 times daily.  Caution:  This type of medication may cause sleepiness.  Do not drink alcohol, drive, or operate dangerous machinery while taking antihistamines.  Do not take these medications if you have prostate enlargement.  Read package instructions thoroughly on all medications that you take.  GET HELP RIGHT AWAY IF:   Symptoms don't go away after treatment.  Severe itching that persists.  If you rash spreads or swells.  If you rash begins to smell.  If it blisters and opens or develops a yellow-brown crust.  You develop a fever.  You have a sore throat.  You become short of breath.  MAKE SURE YOU:  Understand these instructions. Will watch your condition. Will get help right away if you are not doing well or get worse.  Thank you for choosing an e-visit. Your e-visit answers were reviewed by a board certified advanced clinical practitioner to complete your personal care plan. Depending upon the condition, your plan could have included both over the counter or prescription medications. Please review your pharmacy choice. Be sure that the pharmacy you have chosen is open so that you can  pick up your prescription now.  If there is a problem you may message your provider in MyChart to have the prescription routed to another pharmacy. Your safety is important to . If you have drug allergies check your prescription carefully.  For the next 24 hours, you can use MyChart to ask questions about today's visit, request a non-urgent call back, or ask for a work or school excuse from your e-visit provider. You will get an email in the next two days asking about your experience. I hope that your e-visit has been valuable and will speed your recovery.  5-10 minutes spent reviewing and documenting in chart.

## 2020-06-22 ENCOUNTER — Other Ambulatory Visit: Payer: Self-pay | Admitting: Internal Medicine

## 2020-07-26 ENCOUNTER — Other Ambulatory Visit: Payer: Self-pay | Admitting: Internal Medicine

## 2020-07-26 DIAGNOSIS — E034 Atrophy of thyroid (acquired): Secondary | ICD-10-CM

## 2020-07-27 MED ORDER — ONETOUCH VERIO VI STRP
ORAL_STRIP | 2 refills | Status: DC
Start: 2020-07-27 — End: 2021-11-07

## 2020-07-27 NOTE — Telephone Encounter (Signed)
No TSH since 2019 please advise to refill tried call patient and schedule labs, left message to call office, I have added the TSH to the other labs order in 1/22.

## 2020-07-27 NOTE — Telephone Encounter (Signed)
Patient was returning your call about test strips

## 2020-08-31 ENCOUNTER — Other Ambulatory Visit: Payer: Self-pay

## 2020-08-31 ENCOUNTER — Other Ambulatory Visit (INDEPENDENT_AMBULATORY_CARE_PROVIDER_SITE_OTHER): Payer: BC Managed Care – PPO

## 2020-08-31 ENCOUNTER — Other Ambulatory Visit: Payer: Self-pay | Admitting: Internal Medicine

## 2020-08-31 DIAGNOSIS — E559 Vitamin D deficiency, unspecified: Secondary | ICD-10-CM | POA: Diagnosis not present

## 2020-08-31 DIAGNOSIS — E119 Type 2 diabetes mellitus without complications: Secondary | ICD-10-CM

## 2020-08-31 DIAGNOSIS — E034 Atrophy of thyroid (acquired): Secondary | ICD-10-CM

## 2020-09-01 LAB — COMPREHENSIVE METABOLIC PANEL
ALT: 14 U/L (ref 0–35)
AST: 14 U/L (ref 0–37)
Albumin: 4.2 g/dL (ref 3.5–5.2)
Alkaline Phosphatase: 121 U/L — ABNORMAL HIGH (ref 39–117)
BUN: 14 mg/dL (ref 6–23)
CO2: 23 mEq/L (ref 19–32)
Calcium: 9 mg/dL (ref 8.4–10.5)
Chloride: 108 mEq/L (ref 96–112)
Creatinine, Ser: 1.13 mg/dL (ref 0.40–1.20)
GFR: 56.23 mL/min — ABNORMAL LOW (ref >60.00)
Glucose, Bld: 73 mg/dL (ref 70–99)
Potassium: 4 mEq/L (ref 3.5–5.1)
Sodium: 140 mEq/L (ref 135–145)
Total Bilirubin: 0.2 mg/dL (ref 0.2–1.2)
Total Protein: 7.3 g/dL (ref 6.0–8.3)

## 2020-09-01 LAB — LIPID PANEL
Cholesterol: 168 mg/dL (ref 0–200)
HDL: 38.7 mg/dL — ABNORMAL LOW (ref >39.00)
LDL Cholesterol: 106 mg/dL — ABNORMAL HIGH (ref 0–99)
NonHDL: 129.14
Total CHOL/HDL Ratio: 4
Triglycerides: 117 mg/dL (ref 0.0–149.0)
VLDL: 23.4 mg/dL (ref 0.0–40.0)

## 2020-09-01 LAB — HEMOGLOBIN A1C: Hgb A1c MFr Bld: 7.2 % — ABNORMAL HIGH (ref 4.6–6.5)

## 2020-09-01 LAB — VITAMIN D 25 HYDROXY (VIT D DEFICIENCY, FRACTURES): VITD: 68.36 ng/mL (ref 30.00–100.00)

## 2020-09-01 LAB — TSH: TSH: 0.25 u[IU]/mL — ABNORMAL LOW (ref 0.35–4.50)

## 2020-09-04 MED ORDER — LEVOTHYROXINE SODIUM 175 MCG PO TABS: 175.0000 ug | ORAL_TABLET | Freq: Every day | ORAL | 0 refills | Status: DC

## 2020-09-04 NOTE — Addendum Note (Signed)
Addended by: Sherlene Shams on: 09/04/2020 01:31 PM   Modules accepted: Orders

## 2020-09-04 NOTE — Assessment & Plan Note (Signed)
,  Thyroid function is overactive on current dose of 200 mcg. .  I have sent a lower dose of levothyroxine to her pharmacy and would like patient  To change. To 175 mcg daily  .

## 2020-09-17 ENCOUNTER — Other Ambulatory Visit: Payer: Self-pay | Admitting: Internal Medicine

## 2020-10-25 ENCOUNTER — Other Ambulatory Visit: Payer: Self-pay | Admitting: Internal Medicine

## 2020-11-10 ENCOUNTER — Telehealth: Payer: BC Managed Care – PPO | Admitting: Internal Medicine

## 2020-11-10 ENCOUNTER — Encounter: Payer: Self-pay | Admitting: Internal Medicine

## 2020-11-10 VITALS — Ht 67.0 in | Wt 223.0 lb

## 2020-11-10 DIAGNOSIS — R5383 Other fatigue: Secondary | ICD-10-CM

## 2020-11-10 DIAGNOSIS — F4001 Agoraphobia with panic disorder: Secondary | ICD-10-CM

## 2020-11-10 DIAGNOSIS — E538 Deficiency of other specified B group vitamins: Secondary | ICD-10-CM | POA: Diagnosis not present

## 2020-11-10 DIAGNOSIS — E034 Atrophy of thyroid (acquired): Secondary | ICD-10-CM

## 2020-11-10 DIAGNOSIS — E1169 Type 2 diabetes mellitus with other specified complication: Secondary | ICD-10-CM

## 2020-11-10 DIAGNOSIS — Z91119 Patient's noncompliance with dietary regimen due to unspecified reason: Secondary | ICD-10-CM

## 2020-11-10 DIAGNOSIS — Z9111 Patient's noncompliance with dietary regimen: Secondary | ICD-10-CM

## 2020-11-10 DIAGNOSIS — E559 Vitamin D deficiency, unspecified: Secondary | ICD-10-CM

## 2020-11-10 DIAGNOSIS — E119 Type 2 diabetes mellitus without complications: Secondary | ICD-10-CM

## 2020-11-10 DIAGNOSIS — G479 Sleep disorder, unspecified: Secondary | ICD-10-CM

## 2020-11-10 DIAGNOSIS — G4733 Obstructive sleep apnea (adult) (pediatric): Secondary | ICD-10-CM

## 2020-11-10 DIAGNOSIS — I10 Essential (primary) hypertension: Secondary | ICD-10-CM

## 2020-11-10 DIAGNOSIS — E669 Obesity, unspecified: Secondary | ICD-10-CM

## 2020-11-10 MED ORDER — DEXCOM G6 RECEIVER DEVI: 11 refills | Status: DC

## 2020-11-10 MED ORDER — INSULIN ASPART PROT & ASPART (70-30 MIX) 100 UNIT/ML ~~LOC~~ SUSP: 15.0000 [IU] | Freq: Two times a day (BID) | SUBCUTANEOUS | 11 refills | Status: DC

## 2020-11-10 MED ORDER — DEXCOM G6 SENSOR MISC: 11 refills | Status: DC

## 2020-11-10 NOTE — Progress Notes (Signed)
Virtual Visit via [facility]  This visit type was conducted due to national recommendations for restrictions regarding the COVID-19 pandemic (e.g. social distancing).  This format is felt to be most appropriate for this patient at this time.  All issues noted in this document were discussed and addressed.  No physical exam was performed (except for noted visual exam findings with Video Visits).   I connected withNAME@ on 11/10/20 at  1:00 PM EDT by a video enabled telemedicine application  and verified that I am speaking with the correct person using two identifiers. Location patient: home Location provider: work or home office Persons participating in the virtual visit: patient, provider  I discussed the limitations, risks, security and privacy concerns of performing an evaluation and management service by telephone and the availability of in person appointments. I also discussed with the patient that there may be a patient responsible charge related to this service. The patient expressed understanding and agreed to proceed.  Reason for visit:  follow up  HPI:   50 yr old female with type 2 DM, obesity, and tobacco abuse presents for diabetes follow up.  Agoraphobia:  has ventured out a few times  for doctor's visits.  Fearful the entire time but getting 30% better.  Actually went to a family reunion and hugged people  Had COVID 19 infection  in January.mild case,  did not require hospitalization,  Type 2 DM:  not checking sugars more than once daily ; sugars are 200 to 300 again.  Takes 70/30  insulin:  only 2 units in the morning because she does not eat breakfast; 18 to 20 in the evening before dinner. . Wants cbg monitor.   Snoring really loudly ,  wants to repeat sleep study  Weight gain since adjusting thyroid dose.  Not exercising. Has a home gym  Not using b12 regularly, has been inconsistent.    ROS: See pertinent positives and negatives per HPI.  Past Medical History:   Diagnosis Date   Asthma    Carotid stenosis 2008   Dr. Tami Ribas, found during follow up for headaches, left side   Chronic back pain greater than 3 months duration    secondary to [place] 2009   Hirsutism    negative workup by Dr. Nicolasa Ducking, [place], now on spironolactone   History of hearing loss    right ear   Hypertension    Obesity (BMI 30-39.9)    Obesity (BMI 30-39.9) 12/13/2010   Polycystic ovarian syndrome    S/P laparoscopic cholecystectomy 2009   Dr. Jamal Collin, for recurrent billary colic   Thyroid disease    Tobacco abuse     Past Surgical History:  Procedure Laterality Date   BREAST BIOPSY Left 06/18/2017   biopsy of 2 areas - Dr. Dwyane Luo office   CHOLECYSTECTOMY  2009   [facility]ar    Family History  Problem Relation Age of Onset   Coronary artery disease Father    Heart disease Mother    Diabetes Mother    Coronary artery disease Mother    Breast cancer Neg Hx     SOCIAL HX:  reports that she has been smoking cigarettes. She has never used smokeless tobacco. She reports that she does not drink alcohol and does not use drugs.    Current Outpatient Medications:    albuterol (ACCUNEB) 1.25 MG/3ML nebulizer solution, Take 3 mLs (1.25 mg total) by nebulization every 6 (six) hours as needed for wheezing., Disp: 75 mL, Rfl: 12   Alum &  Mag Hydroxide-Simeth (MAALOX ADVANCED PO), Take by mouth at bedtime., Disp: , Rfl:    b complex vitamins tablet, Take 1 tablet by mouth daily., Disp: , Rfl:    Blood Glucose Monitoring Suppl (ONE TOUCH ULTRA SYSTEM KIT) W/DEVICE KIT, 1 kit by Does not apply route once., Disp: 1 each, Rfl: 0   Cholecalciferol (VITAMIN D) 125 MCG (5000 UT) CAPS, Take 1 capsule by mouth. Twice weekly, Disp: , Rfl:    citalopram (CELEXA) 40 MG tablet, Take 40 mg by mouth every morning., Disp: , Rfl:    clonazePAM (KLONOPIN) 0.5 MG tablet, Take 0.25 mg by mouth 2 (two) times daily as needed., Disp: , Rfl:    Continuous Blood Gluc Receiver ([place])  DEVI, Use to check blood sugars before and after meals, Disp: 1 each, Rfl: 11   Continuous Blood Gluc Sensor (DEXCOM G6 SENSOR) MISC, Use to check blood sugars  before  and after each meal, Disp: 1 each, Rfl: 11   cyanocobalamin (,VITAMIN B-12,) 1000 MCG/ML injection, INJECT 1 ML IN THE MUSCLE DAILY FOR 3 DAYS, WEEKLY FOR 2 THEN MONTHLY THEREAFTER, Disp: 10 mL, Rfl: 4   famotidine (PEPCID) 20 MG tablet, TAKE 1 TABLET(20 MG) BY MOUTH TWICE DAILY, Disp: 180 tablet, Rfl: 0   glucose blood (ONETOUCH VERIO) test strip, Use to check blood sugars up to four times daily., Disp: 400 each, Rfl: 2   insulin aspart protamine- aspart (NOVOLOG MIX 70/30) (70-30) 100 UNIT/ML injection, Inject 0.15 mLs (15 Units total) into the skin 2 (two) times daily with a meal., Disp: 10 mL, Rfl: 11   Insulin Pen Needle (B-D UF III MINI PEN NEEDLES) 31G X 5 MM MISC, USE AS DIRECTED THREE TIMES DAILY, Disp: 100 each, Rfl: 2   Lancets (ONETOUCH ULTRASOFT) lancets, Use as instructed, Disp: 100 each, Rfl: 12   levothyroxine (SYNTHROID) 175 MCG tablet, Take 1 tablet (175 mcg total) by mouth daily., Disp: 90 tablet, Rfl: 0   metoprolol succinate (TOPROL-XL) 50 MG 24 hr tablet, TAKE 1 TABLET BY MOUTH DAILY WITH OR IMMEDIATELY FOLLOWING A MEAL, Disp: 90 tablet, Rfl: 1   mometasone (ELOCON) 0.1 % ointment, Apply topically daily. For itchy ear, Disp: 45 g, Rfl: 0   mupirocin ointment (BACTROBAN) 2 %, Apply 1 application topically 2 (two) times daily., Disp: 22 g, Rfl: 0   nystatin (MYCOSTATIN/NYSTOP) powder, Apply topically 2 (two) times daily. on affected areas., Disp: 56.7 g, Rfl: 3   spironolactone (ALDACTONE) 100 MG tablet, TAKE 1 TABLET(100 MG) BY MOUTH TWICE DAILY, Disp: 180 tablet, Rfl: 0   Syringe/Needle, Disp, (SYRINGE 3CC/25GX1") 25G X 1" 3 ML MISC, Use for b12 injections, Disp: 50 each, Rfl: 0   ferrous fumarate (HEMOCYTE - 106 MG FE) 325 (106 FE) MG TABS, Take 1 tablet by mouth.   (Patient not taking: Reported on 11/10/2020),  Disp: , Rfl:    medroxyPROGESTERone (PROVERA) 10 MG tablet, Take 1 tablet (10 mg total) by mouth daily. Use for ten days (Patient not taking: No sig reported), Disp: 10 tablet, Rfl: 2   montelukast (SINGULAIR) 10 MG tablet, TAKE 1 TABLET(10 MG) BY MOUTH AT BEDTIME (Patient not taking: Reported on 11/10/2020), Disp: 90 tablet, Rfl: 1  Current Facility-Administered Medications:    betamethasone acetate-betamethasone sodium phosphate (CELESTONE) injection 3 mg, 3 mg, Intramuscular, Once, Evans, Brent M, DPM  EXAM:  VITALS per patient if applicable:  GENERAL: alert, oriented, appears well and in no acute distress  HEENT: atraumatic, conjunttiva clear, no obvious  abnormalities on inspection of external nose and ears  NECK: normal movements of the head and neck  LUNGS: on inspection no signs of respiratory distress, breathing rate appears normal, no obvious gross SOB, gasping or wheezing  CV: no obvious cyanosis  MS: moves all visible extremities without noticeable abnormality  PSYCH/NEURO: pleasant and cooperative, no obvious depression or anxiety, speech and thought processing grossly intact  ASSESSMENT AND PLAN:  Discussed the following assessment and plan:  Type 2 diabetes mellitus without complication, without long-term current use of insulin ([place]) - Plan: AMB Referral to [facility], Microalbumin / creatinine urine ratio, Hemoglobin A1c, Comprehensive metabolic panel, Vitamin X41, Lipid panel  Obstructive sleep apnea - Plan: Home sleep test  Hypothyroidism due to acquired atrophy of thyroid - Plan: TSH  B12 deficiency - Plan: CBC with Differential/Platelet, Vitamin B12  Vitamin D deficiency - Plan: VITAMIN D 25 Hydroxy (Vit-D Deficiency, Fractures)  Nonadherence with dietary restriction  Type 2 diabetes mellitus with obesity ([facility])  Fatigue due to sleep pattern disturbance  Agoraphobia with panic attacks  Primary hypertension  Nonadherence with dietary  restriction Reviewed diet and use of insulin.  Strongly urged to eat 3 times daily minimum to achieve greater control of diabetes.   Type 2 diabetes mellitus with obesity ([place]) Poorly managed, with hyperglycemia,  Due to dietary non adherence.  Needs to eat 3 times daily  And check BS more often.  Will enlist  Help of clinical pharmacist to get a CBG monitor. Labs due after august 11   Lab Results  Component Value Date   HGBA1C 7.2 (H) 08/31/2020     Fatigue due to sleep pattern disturbance Secondary to loud snoring. Home sleep study ordered   Agoraphobia with panic attacks Developed during [place] pandemic,  With continued fear restricting her movement and access to health care  Obstructive sleep apnea Untreated due to need for titration study in 2019.   Repeat study ordered as she is symptomatic   Hypertension Well controlled on current regimen of spironolactone and metoprolol  based on home readings  .  Renal function stable, no changes today.  Lab Results  Component Value Date   CREATININE 1.13 08/31/2020   .[email or web address] Lab Results  Component Value Date   MICROALBUR <0.7 06/03/2017   MICROALBUR 2.0 (H) 10/24/2015       I discussed the assessment and treatment plan with the patient. The patient was provided an opportunity to ask questions and all were answered. The patient agreed with the plan and demonstrated an understanding of the instructions.   The patient was advised to call back or seek an in-person evaluation if the symptoms worsen or if the condition fails to improve as anticipated.   I spent 30 minutes dedicated to the care of this patient on the date of this encounter to include pre-visit review of his medical history,  Face-to-face time with the patient , and post visit ordering of testing and therapeutics.    Crecencio Mc, MD

## 2020-11-13 DIAGNOSIS — Z9111 Patient's noncompliance with dietary regimen: Secondary | ICD-10-CM | POA: Insufficient documentation

## 2020-11-13 DIAGNOSIS — Z91119 Patient's noncompliance with dietary regimen due to unspecified reason: Secondary | ICD-10-CM | POA: Insufficient documentation

## 2020-11-13 NOTE — Assessment & Plan Note (Addendum)
Well controlled on current regimen of spironolactone and metoprolol  based on home readings  .  Renal function stable, no changes today.  Lab Results  Component Value Date   CREATININE 1.13 08/31/2020   .[email or web address] Lab Results  Component Value Date   MICROALBUR <0.7 06/03/2017   MICROALBUR 2.0 (H) 10/24/2015

## 2020-11-13 NOTE — Assessment & Plan Note (Signed)
Poorly managed, with hyperglycemia,  Due to dietary non adherence.  Needs to eat 3 times daily  And check BS more often.  Will enlist  Help of clinical pharmacist to get a CBG monitor. Labs due after august 11   Lab Results  Component Value Date   HGBA1C 7.2 (H) 08/31/2020

## 2020-11-13 NOTE — Assessment & Plan Note (Signed)
Developed during COVID pandemic,  With continued fear restricting her movement and access to health care

## 2020-11-13 NOTE — Assessment & Plan Note (Signed)
Untreated due to need for titration study in 2019.   Repeat study ordered as she is symptomatic

## 2020-11-13 NOTE — Assessment & Plan Note (Signed)
Secondary to loud snoring. Home sleep study ordered

## 2020-11-13 NOTE — Assessment & Plan Note (Signed)
Reviewed diet and use of insulin.  Strongly urged to eat 3 times daily minimum to achieve greater control of diabetes.

## 2020-11-14 ENCOUNTER — Telehealth: Payer: Self-pay

## 2020-11-14 NOTE — Chronic Care Management (AMB) (Signed)
  Care Management   Note  11/14/2020 Name: Madeline Strickland MRN: [id number] DOB: [birth year]  [name] is a 50 y.o. year old female who is a primary care patient of Darrick Huntsman, Mar Daring, MD. I reached out to [name] by phone today in response to a referral sent by [name] health plan.    [name] was given information about care management services today including:  Care management services include personalized support from designated clinical staff supervised by her physician, including individualized plan of care and coordination with other care providers 24/7 contact phone numbers for assistance for urgent and routine care needs. The patient may stop care management services at any time by phone call to the office staff.  Patient agreed to services and verbal consent obtained.   Follow up plan: Telephone appointment with care management team member scheduled for:11/30/2020   Penne Lash, RMA Care Guide, Embedded Care Coordination [facility]  [place], [place] [address] Direct Dial: [phone] [email or web address]

## 2020-11-21 ENCOUNTER — Telehealth: Payer: Self-pay

## 2020-11-21 NOTE — Telephone Encounter (Signed)
PA for Dexcom was submitted and approved. Pt is aware.

## 2020-11-22 NOTE — Telephone Encounter (Signed)
Approved through 11/21/2021.

## 2020-11-30 ENCOUNTER — Ambulatory Visit: Payer: BC Managed Care – PPO | Admitting: Pharmacist

## 2020-11-30 DIAGNOSIS — E669 Obesity, unspecified: Secondary | ICD-10-CM

## 2020-11-30 DIAGNOSIS — I10 Essential (primary) hypertension: Secondary | ICD-10-CM

## 2020-11-30 DIAGNOSIS — J45909 Unspecified asthma, uncomplicated: Secondary | ICD-10-CM

## 2020-11-30 DIAGNOSIS — E119 Type 2 diabetes mellitus without complications: Secondary | ICD-10-CM

## 2020-11-30 DIAGNOSIS — E034 Atrophy of thyroid (acquired): Secondary | ICD-10-CM

## 2020-11-30 MED ORDER — BUDESONIDE-FORMOTEROL FUMARATE 80-4.5 MCG/ACT IN AERO: INHALATION_SPRAY | RESPIRATORY_TRACT | 3 refills | Status: DC

## 2020-11-30 MED ORDER — TIRZEPATIDE 2.5 MG/0.5ML ~~LOC~~ SOAJ: 2.5000 mg | SUBCUTANEOUS | 1 refills | Status: AC

## 2020-11-30 MED ORDER — TIRZEPATIDE 5 MG/0.5ML ~~LOC~~ SOAJ: 5.0000 mg | SUBCUTANEOUS | 2 refills | Status: DC

## 2020-11-30 NOTE — Chronic Care Management (AMB) (Signed)
Care Management   Pharmacy Note  11/30/2020 Name: Madeline Strickland MRN: [id number] DOB: 22-Feb-1971  Subjective: Madeline Strickland is a 50 y.o. year old female who is a primary care patient of Crecencio Mc, MD. The Care Management team was consulted for assistance with care management and care coordination needs.    Engaged with patient by telephone for initial visit in response to provider referral for pharmacy case management and/or care coordination services.   The patient was given information about Care Management services today including:  Care Management services includes personalized support from designated clinical staff supervised by the patient's primary care provider, including individualized plan of care and coordination with other care providers. 24/7 contact phone numbers for assistance for urgent and routine care needs. The patient may stop case management services at any time by phone call to the office staff.  Patient agreed to services and consent obtained.  Assessment:  Review of patient status, including review of consultants reports, laboratory and other test data, was performed as part of comprehensive evaluation and provision of chronic care management services.   SDOH (Social Determinants of Health) assessments and interventions performed:  SDOH Interventions    Flowsheet Row Most Recent Value  SDOH Interventions   Financial Strain Interventions Intervention Not Indicated        Objective:  Lab Results  Component Value Date   CREATININE 1.13 08/31/2020   CREATININE 1.19 12/19/2017   CREATININE 1.02 06/03/2017    Lab Results  Component Value Date   HGBA1C 7.2 (H) 08/31/2020       Component Value Date/Time   CHOL 168 08/31/2020 1630   TRIG 117.0 08/31/2020 1630   HDL 38.70 (L) 08/31/2020 1630   CHOLHDL 4 08/31/2020 1630   VLDL 23.4 08/31/2020 1630   LDLCALC 106 (H) 08/31/2020 1630   LDLDIRECT 131.0 10/24/2015 0923     Clinical ASCVD: No  The  10-year ASCVD risk score Mikey Bussing DC Jr., et al., 2013) is: 10.1%   Values used to calculate the score:     Age: 52 years     Sex: Female     Is Non-Hispanic African American: No     Diabetic: Yes     Tobacco smoker: Yes     Systolic Blood Pressure: 263 mmHg     Is BP treated: Yes     HDL Cholesterol: 38.7 mg/dL     Total Cholesterol: 168 mg/dL      BP Readings from Last 3 Encounters:  05/09/20 117/60  01/22/20 117/72  09/10/19 (!) 145/85    Care Plan  Allergies  Allergen Reactions   Levofloxacin Anaphylaxis   Vicks Dayquil Cold & Flu [Dm-Phenylephrine-Acetaminophen] Anaphylaxis    With levaquin   Avelox [Moxifloxacin Hcl In Nacl] Hives, Itching and Swelling   Latex Itching    Medications Reviewed Today     Reviewed by De Hollingshead, RPH-CPP (Pharmacist) on 11/30/20 at 1356  Med List Status: <None>   Medication Order Taking? Sig Documenting Provider Last Dose Status Informant  albuterol (ACCUNEB) 1.25 MG/3ML nebulizer solution [id number] Yes Take 3 mLs (1.25 mg total) by nebulization every 6 (six) hours as needed for wheezing. Jodelle Green, FNP Taking Active   Alum & Mag Hydroxide-Simeth (MAALOX ADVANCED PO) [id number] Yes Take by mouth at bedtime. [provider] Taking Active   b complex vitamins tablet [id number] Yes Take 1 tablet by mouth daily. [provider] Taking Active   betamethasone acetate-betamethasone sodium phosphate (CELESTONE) injection 3 mg  [id number]   Edrick Kins, DPM  Consider Medication Status and Discontinue   Blood Glucose Monitoring Suppl (ONE TOUCH ULTRA SYSTEM KIT) W/DEVICE KIT [id number] Yes 1 kit by Does not apply route once. Crecencio Mc, MD Taking Active   Cholecalciferol (VITAMIN D) 125 MCG (5000 UT) CAPS [id number] Yes Take 1 capsule by mouth. Twice weekly [provider] Taking Active   citalopram (CELEXA) 40 MG tablet [id number] Yes Take 40 mg by mouth every morning. [provider] Taking Active    clonazePAM (KLONOPIN) 0.5 MG tablet [id number] Yes Take 0.25 mg by mouth 2 (two) times daily as needed. [provider] Taking Active   Continuous Blood Gluc Receiver ([place]) [place] [id number] Yes Use to check blood sugars before and after meals Crecencio Mc, MD Taking Active   Continuous Blood Gluc Sensor (DEXCOM G6 SENSOR) [place] [id number] Yes Use to check blood sugars  before  and after each meal Crecencio Mc, MD Taking Active   cyanocobalamin (,VITAMIN B-12,) 1000 MCG/ML injection [id number] Yes INJECT 1 ML IN THE MUSCLE DAILY FOR 3 DAYS, WEEKLY FOR 2 THEN MONTHLY THEREAFTER Crecencio Mc, MD Taking Active   famotidine (PEPCID) 20 MG tablet [id number] Yes TAKE 1 TABLET(20 MG) BY MOUTH TWICE DAILY Crecencio Mc, MD Taking Active   ferrous fumarate (HEMOCYTE - 106 MG FE) 325 (106 FE) MG TABS [id number] No Take 1 tablet by mouth.    Patient not taking: No sig reported   [provider] Not Taking Active            Med Note Darnelle Maffucci, [place] Nov 30, 2020  1:55 PM)    glucose blood [facility] VERIO) test strip [id number] Yes Use to check blood sugars up to four times daily. Crecencio Mc, MD Taking Active   insulin aspart protamine- aspart (NOVOLOG MIX 70/30) (70-30) 100 UNIT/ML injection [id number] Yes Inject 0.15 mLs (15 Units total) into the skin 2 (two) times daily with a meal. Crecencio Mc, MD Taking Active   Insulin Pen Needle (B-D UF III MINI PEN NEEDLES) 31G X 5 MM MISC [id number] Yes USE AS DIRECTED THREE TIMES DAILY Crecencio Mc, MD Taking Active   Lancets [facility] ULTRASOFT) lancets [id number] Yes Use as instructed Crecencio Mc, MD Taking Active   levothyroxine (SYNTHROID) 175 MCG tablet [id number] Yes Take 1 tablet (175 mcg total) by mouth daily. Crecencio Mc, MD Taking Active   metoprolol succinate (TOPROL-XL) 50 MG 24 hr tablet [id number] Yes TAKE 1 TABLET BY MOUTH DAILY WITH OR IMMEDIATELY FOLLOWING A MEAL Crecencio Mc, MD Taking  Active   mometasone (ELOCON) 0.1 % ointment [id number] Yes Apply topically daily. For itchy ear Crecencio Mc, MD Taking Active   montelukast (SINGULAIR) 10 MG tablet [id number] Yes TAKE 1 TABLET(10 MG) BY MOUTH AT BEDTIME Crecencio Mc, MD Taking Active   mupirocin ointment (BACTROBAN) 2 % [id number] Yes Apply 1 application topically 2 (two) times daily. Crecencio Mc, MD Taking Active   nystatin (MYCOSTATIN/NYSTOP) powder [id number] Yes Apply topically 2 (two) times daily. on affected areas. Hassell Done, Mary-Margaret, FNP Taking Active   spironolactone (ALDACTONE) 100 MG tablet [id number] Yes TAKE 1 TABLET(100 MG) BY MOUTH TWICE DAILY Crecencio Mc, MD Taking Active   Syringe/Needle, Disp, (SYRINGE 3CC/25GX1") 25G X 1" 3 ML MISC [id number]  Use for b12 injections Crecencio Mc, MD  Active  Patient Active Problem List   Diagnosis Date Noted   Nonadherence with dietary restriction 11/13/2020   History of COVID-19 05/09/2020   Agoraphobia with panic attacks 02/19/2019   Carotid artery calcification, bilateral 12/21/2017   Mass of upper outer quadrant of left breast 06/18/2017   Leukocytosis 06/05/2017   Anxiety state 07/20/2015   Fatigue due to sleep pattern disturbance 03/03/2015   Vitamin B12 deficiency anemia due to intrinsic factor deficiency 03/03/2015   Vitamin D deficiency 03/03/2015   Hyperlipidemia associated with type 2 diabetes mellitus ([place]) 07/24/2012   Type 2 diabetes mellitus with obesity ([place]) 04/28/2012   Screening for breast cancer 03/28/2011   Chronic back pain greater than 3 months duration    Screening for cervical cancer 03/27/2011   Tobacco abuse    Intrinsic asthma    Hypertension    Polycystic ovarian syndrome    Hypothyroidism due to acquired atrophy of thyroid    Obstructive sleep apnea 12/13/2010   Obesity (BMI 30-39.9) 12/13/2010    Conditions to be addressed/monitored: HLD and DMII  Care Plan : Medication Management  Updates made by  De Hollingshead, RPH-CPP since 11/30/2020 12:00 AM     Problem: Diabetes, HLD      Long-Range Goal: Disease Progression Prevention   Start Date: 11/30/2020  This Visit's Progress: On track  Priority: High  Note:   Current Barriers:  Unable to achieve control of diabetes   Pharmacist Clinical Goal(s):  Over the next 90 days, patient will achieve control of diabetes as evidenced by A1c  through collaboration with PharmD and provider.   Interventions: 1:1 collaboration with Crecencio Mc, MD regarding development and update of comprehensive plan of care as evidenced by provider attestation and co-signature Inter-disciplinary care team collaboration (see longitudinal plan of care) Comprehensive medication review performed; medication list updated in electronic medical record  Diabetes: Uncontrolled; current treatment: insulin 70/30 15 units BID;  Reports hypoglycemic symptoms Current meal patterns: breakfast: skips; lunch: fast food, burgers, fries, [employer] sandwiches; dinner: prone to overeating, overeats with lows; snacks: skittle ; drinks: milkshakes; reports that she really likes carbohydrates and has a hard time avoiding.  Current exercise: none, though notes she has a home gym. Has not picked up DexCom from the pharmacy yet.  Counseled on GLP1 agonists, including mechanism of action, side effects, and benefits. No personal or family history of medullary thyroid cancer, personal history of pancreatitis or gallbladder disease. Counseled on potential side effects of nausea, stomach upset, queasiness, constipation, and that these generally improve over time. Advised to contact our office with more severe symptoms, including nausea, diarrhea, stomach pain. Patient verbalized understanding. Stop insulin. Start Mounjaro 2.5 mg weekly for 4 weeks, then increase to 5 mg weekly. Counseled on how to download savings card available on manufacturer website.  Extensive dietary counseling.  Advised on principals such as focus on proteins, whole grains, fruits, vegetables, minimization of carbohydrates. Discussed that reduction in fatty foods may help reduce incidence of GI side effects. Discussed importance of hydration in minimizing side effects of constipation.  Discussed importance of physical activity. Suggested setting attainable goal, such as 10-15 minutes 1-2 days weekly, and building up from there. Patient verbalized understanding.   Hypertension: Controlled; current treatment: spironolactone 100 mg BID (PCOS), metoprolol succinate 50 mg daily;  Current home readings: did not discuss today Recommended to continue current regimen at this time  Hyperlipidemia: Unmanaged; current treatment: none;  Medications previously tried: none  Current dietary patterns: high in fats Given diabetes,  ASCVD risk score >10%, recommend initiation of moderate intensity statin. Will discuss moving forward.   Depression/Anxiety/Agoraphobia: Moderately well managed; current treatment: citalopram 40 mg daily, clonazepam 0.25 mg BID PRN Recommended to continue current regimen at this time  Asthma: Uncontrolled, reports increased need for albuterol HFA recently, 2-3 times daily; montelukast 10 mg daily  Notes she was previously on Symbicort and had benefit. Stopped Symbicort when she moved to working from home as she was not needing therapy. Requests to start back today.  Restart Symbicort 80/4.5 mcg 2 puffs BID PRN, can also use as rescue 1-2 puffs Q4H PRN, max 12 puffs daily. Patient verbalizes understanding. Continue montelukast 10 mg daily  Hypothyroidism: Uncontrolled on last check; current regimen: levothyroxine 175 mcg daily Follow up with PCP as previously scheduled for follow up lab work  GERD: Controlled per patient report; current regimen: famotidine 20 mg BID, Maalox as needed Continue current regimen at this time  Supplement: Vitamin C, biotin, B complex, Vitamin D, Vitamin  B12, ferrous sulfate PRN   Patient Goals/Self-Care Activities Over the next 90 days, patient will:  - take medications as prescribed target a minimum of 150 minutes of moderate intensity exercise weekly engage in dietary modifications by moderating carbohydrate intake  Follow Up Plan: Telephone follow up appointment with care management team member scheduled for: 6 weeks      Medication Assistance:  None required.  Patient affirms current coverage meets needs.  Follow Up:  Patient agrees to Care Plan and Follow-up.  Plan: Telephone follow up appointment with care management team member scheduled for:  ~ 6 weeks  Catie Darnelle Maffucci, PharmD, [place], [place] Clinical Pharmacist [employer] at [employer] [phone]

## 2020-11-30 NOTE — Patient Instructions (Signed)
Visit Information  PATIENT GOALS:   Goals Addressed               This Visit's Progress     Patient Stated     Medication Monitoring (pt-stated)        Patient Goals/Self-Care Activities Over the next 90 days, patient will:  - take medications as prescribed target a minimum of 150 minutes of moderate intensity exercise weekly engage in dietary modifications by moderating carbohydrate intake        Ms. Basara was given information about Care Management services by the embedded care coordination team including:  Care Management services include personalized support from designated clinical staff supervised by her physician, including individualized plan of care and coordination with other care providers 24/7 contact phone numbers for assistance for urgent and routine care needs. The patient may stop CCM services at any time (effective at the end of the month) by phone call to the office staff.  Patient agreed to services and verbal consent obtained.   Patient verbalizes understanding of instructions provided today and agrees to view in MyChart.  Plan: Telephone follow up appointment with care management team member scheduled for:  ~ 6 weeks  [name], PharmD, [place], CPP Clinical Pharmacist [employer] at [employer] [phone]

## 2020-12-19 ENCOUNTER — Other Ambulatory Visit: Payer: Self-pay

## 2020-12-19 MED ORDER — DEXCOM G6 TRANSMITTER MISC: 3 refills | Status: DC

## 2020-12-19 NOTE — Progress Notes (Signed)
Dexcom transmitter has been sent to pharmacy.

## 2020-12-21 ENCOUNTER — Other Ambulatory Visit: Payer: Self-pay | Admitting: Internal Medicine

## 2020-12-21 ENCOUNTER — Telehealth: Payer: BC Managed Care – PPO | Admitting: Physician Assistant

## 2020-12-21 DIAGNOSIS — B373 Candidiasis of vulva and vagina: Secondary | ICD-10-CM | POA: Diagnosis not present

## 2020-12-21 DIAGNOSIS — B3731 Acute candidiasis of vulva and vagina: Secondary | ICD-10-CM

## 2020-12-21 MED ORDER — FLUCONAZOLE 150 MG PO TABS: 150.0000 mg | ORAL_TABLET | Freq: Once | ORAL | 0 refills | Status: AC

## 2020-12-21 NOTE — Progress Notes (Signed)

## 2020-12-21 NOTE — Progress Notes (Signed)
I have spent 5 minutes in review of e-visit questionnaire, review and updating patient chart, medical decision making and response to patient.   Austine Kelsay Cody Jaline Pincock, PA-C    

## 2020-12-29 ENCOUNTER — Other Ambulatory Visit: Payer: Self-pay | Admitting: Internal Medicine

## 2021-01-13 ENCOUNTER — Ambulatory Visit: Payer: BC Managed Care – PPO | Admitting: Pharmacist

## 2021-01-13 DIAGNOSIS — E1169 Type 2 diabetes mellitus with other specified complication: Secondary | ICD-10-CM

## 2021-01-13 DIAGNOSIS — E785 Hyperlipidemia, unspecified: Secondary | ICD-10-CM

## 2021-01-13 DIAGNOSIS — E119 Type 2 diabetes mellitus without complications: Secondary | ICD-10-CM

## 2021-01-13 DIAGNOSIS — J45909 Unspecified asthma, uncomplicated: Secondary | ICD-10-CM

## 2021-01-13 MED ORDER — TIRZEPATIDE 2.5 MG/0.5ML ~~LOC~~ SOAJ: 2.5000 mg | SUBCUTANEOUS | 0 refills | Status: DC

## 2021-01-13 NOTE — Chronic Care Management (AMB) (Signed)
Care Management   Pharmacy Note  01/13/2021 Name: Madeline Strickland MRN: [id number] DOB: Mar 03, 1971  Subjective: Madeline Strickland is a 50 y.o. year old female who is a primary care patient of Crecencio Mc, MD. The Care Management team was consulted for assistance with care management and care coordination needs.    Engaged with patient by telephone for follow up visit in response to provider referral for pharmacy case management and/or care coordination services.   The patient was given information about Care Management services today including:  Care Management services includes personalized support from designated clinical staff supervised by the patient's primary care provider, including individualized plan of care and coordination with other care providers. 24/7 contact phone numbers for assistance for urgent and routine care needs. The patient may stop case management services at any time by phone call to the office staff.  Patient agreed to services and consent obtained.  Assessment:  Review of patient status, including review of consultants reports, laboratory and other test data, was performed as part of comprehensive evaluation and provision of chronic care management services.   SDOH (Social Determinants of Health) assessments and interventions performed:    Objective:  Lab Results  Component Value Date   CREATININE 1.13 08/31/2020   CREATININE 1.19 12/19/2017   CREATININE 1.02 06/03/2017    Lab Results  Component Value Date   HGBA1C 7.2 (H) 08/31/2020       Component Value Date/Time   CHOL 168 08/31/2020 1630   TRIG 117.0 08/31/2020 1630   HDL 38.70 (L) 08/31/2020 1630   CHOLHDL 4 08/31/2020 1630   VLDL 23.4 08/31/2020 1630   LDLCALC 106 (H) 08/31/2020 1630   LDLDIRECT 131.0 10/24/2015 0923     BP Readings from Last 3 Encounters:  05/09/20 117/60  01/22/20 117/72  09/10/19 (!) 145/85    Care Plan  Allergies  Allergen Reactions   Levofloxacin Anaphylaxis    Vicks Dayquil Cold & Flu [Dm-Phenylephrine-Acetaminophen] Anaphylaxis    With levaquin   Avelox [Moxifloxacin Hcl In Nacl] Hives, Itching and Swelling   Latex Itching    Medications Reviewed Today     Reviewed by De Hollingshead, RPH-CPP (Pharmacist) on 01/13/21 at 1344  Med List Status: <None>   Medication Order Taking? Sig Documenting Provider Last Dose Status Informant  Alum & Mag Hydroxide-Simeth (MAALOX ADVANCED PO) [id number] Yes Take by mouth at bedtime. [provider] Taking Active   Ascorbic Acid (VITAMIN C) 100 MG tablet [id number] Yes Take 100 mg by mouth daily. [provider] Taking Active   b complex vitamins tablet [id number] Yes Take 1 tablet by mouth daily. [provider] Taking Active   betamethasone acetate-betamethasone sodium phosphate (CELESTONE) injection 3 mg [id number]   Edrick Kins, DPM  Active   BIOTIN PO [id number] Yes Take by mouth. [provider] Taking Active   Blood Glucose Monitoring Suppl (ONE TOUCH ULTRA SYSTEM KIT) W/DEVICE KIT [id number] Yes 1 kit by Does not apply route once. Crecencio Mc, MD Taking Active   budesonide-formoterol [facility]) 80-4.5 MCG/ACT inhaler [id number] Yes 1-2 puffs BID PRN. May use 1-2 puffs Q4H PRN symptom flares, maximum dose 12 inhalations per day Crecencio Mc, MD Taking Active   Cholecalciferol (VITAMIN D) 125 MCG (5000 UT) CAPS [id number] Yes Take 1 capsule by mouth. Twice weekly [provider] Taking Active   citalopram (CELEXA) 40 MG tablet [id number] Yes Take 40 mg by mouth every morning. [provider] Taking Active   clonazePAM (  KLONOPIN) 0.5 MG tablet [id number] Yes Take 0.25 mg by mouth 2 (two) times daily as needed. [provider] Taking Active   COLLAGEN PO [id number] Yes Take by mouth. [provider] Taking Active   Continuous Blood Gluc Receiver (Hanna) DEVI [id number]  Use to check blood sugars before and after meals Crecencio Mc, MD  Active   Continuous Blood Gluc Sensor (DEXCOM G6 SENSOR) MISC [id number]  Use to check blood sugars  before  and after each meal Crecencio Mc, MD  Active   Continuous Blood Gluc Transmit (DEXCOM G6 TRANSMITTER) MISC [id number]  Use to check blood sugars up to 4 times daily. Crecencio Mc, MD  Active   cyanocobalamin (,VITAMIN B-12,) 1000 MCG/ML injection [id number] No INJECT 1 ML IN THE MUSCLE DAILY FOR 3 DAYS, WEEKLY FOR 2 THEN MONTHLY THEREAFTER  Patient not taking: Reported on 01/13/2021   Crecencio Mc, MD Not Taking Active   DULoxetine (CYMBALTA) 30 MG capsule [id number] Yes Take 30 mg by mouth daily. [provider] Taking Active   famotidine (PEPCID) 20 MG tablet [id number] Yes TAKE 1 TABLET(20 MG) BY MOUTH TWICE DAILY Crecencio Mc, MD Taking Active   ferrous fumarate (HEMOCYTE - 106 MG FE) 325 (106 FE) MG TABS [id number] Yes Take 1 tablet by mouth. [provider] Taking Active            Med Note Darnelle Maffucci, Arville Lime   Wed Nov 30, 2020  1:55 PM)    glucose blood [facility] VERIO) test strip [id number]  Use to check blood sugars up to four times daily. Crecencio Mc, MD  Active   Insulin Pen Needle (B-D UF III MINI PEN NEEDLES) 31G X 5 MM MISC [id number] Yes USE AS DIRECTED THREE TIMES DAILY Crecencio Mc, MD Taking Active   Lancets [facility] ULTRASOFT) lancets [id number]  Use as instructed Crecencio Mc, MD  Active   levothyroxine (SYNTHROID) 175 MCG tablet [id number] Yes TAKE 1 TABLET(175 MCG) BY MOUTH DAILY Crecencio Mc, MD Taking Active   metoprolol succinate (TOPROL-XL) 50 MG 24 hr tablet [id number] Yes TAKE 1 TABLET BY MOUTH DAILY WITH OR IMMEDIATELY FOLLOWING A MEAL Crecencio Mc, MD Taking Active   montelukast (SINGULAIR) 10 MG tablet [id number] Yes TAKE 1 TABLET(10 MG) BY MOUTH AT BEDTIME Crecencio Mc, MD Taking Active   mupirocin ointment (BACTROBAN) 2 % [id number] Yes Apply 1 application topically 2 (two) times daily. Crecencio Mc, MD  Taking Active   nystatin (MYCOSTATIN/NYSTOP) powder [id number] Yes Apply topically 2 (two) times daily. on affected areas. Hassell Done, Mary-Margaret, FNP Taking Active   spironolactone (ALDACTONE) 100 MG tablet [id number] Yes TAKE 1 TABLET(100 MG) BY MOUTH TWICE DAILY Crecencio Mc, MD Taking Active   Syringe/Needle, Disp, (SYRINGE 3CC/25GX1") 25G X 1" 3 ML MISC [id number]  Use for b12 injections Crecencio Mc, MD  Active   tirzepatide [facility]) 5 MG/0.5ML Pen [id number] No Inject 5 mg into the skin once a week.  Patient not taking: Reported on 01/13/2021   Crecencio Mc, MD Not Taking Active   traZODone (DESYREL) 50 MG tablet [id number] No Take 50 mg by mouth at bedtime as needed.  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   Carotid artery calcification, bilateral 12/21/2017   Mass of upper outer quadrant of left breast 06/18/2017   Leukocytosis 06/05/2017   Anxiety state 07/20/2015   Fatigue due to sleep pattern disturbance 03/03/2015   Vitamin B12 deficiency anemia due to intrinsic factor deficiency 03/03/2015   Vitamin D deficiency 03/03/2015   Hyperlipidemia associated with type 2 diabetes mellitus ([place]) 07/24/2012   Type 2 diabetes mellitus with obesity ([place]) 04/28/2012   Screening for breast cancer 03/28/2011   Chronic back pain greater than 3 months duration    Screening for cervical cancer 03/27/2011   Tobacco abuse    Intrinsic asthma    Hypertension    Polycystic ovarian syndrome    Hypothyroidism due to acquired atrophy of thyroid    Obstructive sleep apnea 12/13/2010   Obesity (BMI 30-39.9) 12/13/2010    Conditions to be addressed/monitored: HLD and DMII  Care Plan : Medication Management  Updates made by De Hollingshead,  RPH-CPP since 01/13/2021 12:00 AM     Problem: Diabetes, HLD      Long-Range Goal: Disease Progression Prevention   Start Date: 11/30/2020  This Visit's Progress: On track  Recent Progress: On track  Priority: High  Note:   Current Barriers:  Unable to achieve control of diabetes   Pharmacist Clinical Goal(s):  Over the next 90 days, patient will achieve control of diabetes as evidenced by A1c  through collaboration with PharmD and provider.   Interventions: 1:1 collaboration with Crecencio Mc, MD regarding development and update of comprehensive plan of care as evidenced by provider attestation and co-signature Inter-disciplinary care team collaboration (see longitudinal plan of care) Comprehensive medication review performed; medication list updated in electronic medical record  Diabetes: Uncontrolled; current treatment: insulin 70/30 15 units BID - though taking PRN; has not started [facility] yet as she wanted to see if she tolerated new medication from psychiatry first (duloxetine) Reports hypoglycemic symptoms. Treats with a Mt Dew.  Current meal patterns: breakfast: skips; lunch and supper are biggest meals, fast food. Does report that she has gone back to cooking at home - baking chicken, air fryer. Meat, starch, green; drinks: unsweet tea, sometimes equal or lemon juice added; occasional Mt Dew, but to treat low blood sugars Current exercise: none, though notes she has a home gym. Is getting outside more, tries to walk around more.  Picked up DexCom from the pharmacy, but has not started using yet. Current post prandial: 160-220s Stop insulin. Start Mounjaro 2.5 mg weekly. Reviewed potential side effects of queasiness, nausea.  Extensive dietary counseling. Advised on principals Strickland as focus on proteins, whole grains, fruits, vegetables, minimization of carbohydrates. Praised for more focus on physical activity.   Hypertension: Controlled; current treatment: spironolactone  100 mg BID (PCOS), metoprolol succinate 50 mg daily;  Previously recommended to continue current regimen at this time  Hyperlipidemia: Unmanaged; current treatment: none;  Medications previously tried: none  Current dietary patterns: high in fats Given diabetes, ASCVD risk score >10%, recommend initiation of moderate intensity statin. Will discuss moving forward.   Depression/Anxiety/Agoraphobia: Moderately well managed; current treatment: citalopram 40 mg daily, clonazepam 0.25 mg BID PRN Previously recommended to continue current regimen at this time  Asthma: Controlled; current regimen: Symbicort 80/4.5 mcg 2 puffs BID PRN albuterol HFA PRN, montelukast 10 mg daily  Reports significant improvement in breathing since restarting Symbicort Recommended to continue current regimen at this time  Hypothyroidism: Uncontrolled on last check; current regimen: levothyroxine 175  mcg daily Follow up with PCP as previously scheduled for follow up lab work  GERD: Controlled per patient report; current regimen: famotidine 20 mg BID, Maalox as needed Continue current regimen at this time  Supplement: Vitamin C, biotin, B complex, Vitamin D, Vitamin B12, ferrous sulfate PRN   Patient Goals/Self-Care Activities Over the next 90 days, patient will:  - take medications as prescribed target a minimum of 150 minutes of moderate intensity exercise weekly engage in dietary modifications by moderating carbohydrate intake  Follow Up Plan: Telephone follow up appointment with care management team member scheduled for: 4 weeks      Medication Assistance:  None required.  Patient affirms current coverage meets needs.  Follow Up:  Patient agrees to Care Plan and Follow-up.  Plan: Telephone follow up appointment with care management team member scheduled for:  ~ 4 weeks  Catie Darnelle Maffucci, PharmD, [place], [place] Clinical Pharmacist [employer] at [employer] [phone]

## 2021-01-13 NOTE — Patient Instructions (Signed)
Dion[place],   Stop insulin. Start Mounjaro 2.5 mg weekly. This medication may cause stomach upset, queasiness, or constipation, especially when first starting. This generally improves over time. Call our office if these symptoms occur and worsen, or if you have severe symptoms such as vomiting, diarrhea, or stomach pain.   Let me know when you get DexCom all set up and we can connect you to our patient portal so that I can see your readings at our future calls.   Take care!  [name], PharmD  Visit Information   Goals Addressed               This Visit's Progress     Patient Stated     Medication Monitoring (pt-stated)        Patient Goals/Self-Care Activities Over the next 90 days, patient will:  - take medications as prescribed target a minimum of 150 minutes of moderate intensity exercise weekly engage in dietary modifications by moderating carbohydrate intake         Patient verbalizes understanding of instructions provided today and agrees to view in MyChart.   Plan: Telephone follow up appointment with care management team member scheduled for:  ~ 4 weeks  [name], PharmD, [place], CPP Clinical Pharmacist [employer] at [employer] [phone]

## 2021-01-24 NOTE — Telephone Encounter (Signed)
See mychart message.  Advised to resume 70/30 5 units bid and send readings in one week  TT

## 2021-01-28 ENCOUNTER — Other Ambulatory Visit: Payer: Self-pay | Admitting: Internal Medicine

## 2021-02-06 ENCOUNTER — Ambulatory Visit: Payer: BC Managed Care – PPO | Admitting: Pharmacist

## 2021-02-06 DIAGNOSIS — I10 Essential (primary) hypertension: Secondary | ICD-10-CM

## 2021-02-06 DIAGNOSIS — E785 Hyperlipidemia, unspecified: Secondary | ICD-10-CM

## 2021-02-06 DIAGNOSIS — E119 Type 2 diabetes mellitus without complications: Secondary | ICD-10-CM

## 2021-02-06 DIAGNOSIS — E1169 Type 2 diabetes mellitus with other specified complication: Secondary | ICD-10-CM

## 2021-02-06 NOTE — Patient Instructions (Signed)
Visit Information  PATIENT GOALS:  Goals Addressed               This Visit's Progress     Patient Stated     Medication Monitoring (pt-stated)        Patient Goals/Self-Care Activities Over the next 90 days, patient will:  - take medications as prescribed target a minimum of 150 minutes of moderate intensity exercise weekly engage in dietary modifications by moderating carbohydrate intake        Patient verbalizes understanding of instructions provided today and agrees to view in MyChart.    Plan: Telephone follow up appointment with care management team member scheduled for:  1 weeks  [name], PharmD, [place], CPP Clinical Pharmacist [employer] at [employer] [phone]

## 2021-02-06 NOTE — Chronic Care Management (AMB) (Signed)
Care Management   Pharmacy Note  02/06/2021 Name: Madeline Strickland MRN: [id number] DOB: 01/19/1971  Subjective: Madeline Strickland is a 50 y.o. year old female who is a primary care patient of Crecencio Mc, MD. The Care Management team was consulted for assistance with care management and care coordination needs.    Engaged with patient by telephone for follow up visit in response to provider referral for pharmacy case management and/or care coordination services.   The patient was given information about Care Management services today including:  Care Management services includes personalized support from designated clinical staff supervised by the patient's primary care provider, including individualized plan of care and coordination with other care providers. 24/7 contact phone numbers for assistance for urgent and routine care needs. The patient may stop case management services at any time by phone call to the office staff.  Patient agreed to services and consent obtained.  Assessment:  Review of patient status, including review of consultants reports, laboratory and other test data, was performed as part of comprehensive evaluation and provision of chronic care management services.   SDOH (Social Determinants of Health) assessments and interventions performed:    Objective:  Lab Results  Component Value Date   CREATININE 1.13 08/31/2020   CREATININE 1.19 12/19/2017   CREATININE 1.02 06/03/2017    Lab Results  Component Value Date   HGBA1C 7.2 (H) 08/31/2020       Component Value Date/Time   CHOL 168 08/31/2020 1630   TRIG 117.0 08/31/2020 1630   HDL 38.70 (L) 08/31/2020 1630   CHOLHDL 4 08/31/2020 1630   VLDL 23.4 08/31/2020 1630   LDLCALC 106 (H) 08/31/2020 1630   LDLDIRECT 131.0 10/24/2015 0923     BP Readings from Last 3 Encounters:  05/09/20 117/60  01/22/20 117/72  09/10/19 (!) 145/85    Care Plan  Allergies  Allergen Reactions   Levofloxacin Anaphylaxis    Vicks Dayquil Cold & Flu [Dm-Phenylephrine-Acetaminophen] Anaphylaxis    With levaquin   Avelox [Moxifloxacin Hcl In Nacl] Hives, Itching and Swelling   Latex Itching    Medications Reviewed Today     Reviewed by De Hollingshead, RPH-CPP (Pharmacist) on 01/13/21 at 1344  Med List Status: <None>   Medication Order Taking? Sig Documenting Provider Last Dose Status Informant  Alum & Mag Hydroxide-Simeth (MAALOX ADVANCED PO) [id number] Yes Take by mouth at bedtime. [provider] Taking Active   Ascorbic Acid (VITAMIN C) 100 MG tablet [id number] Yes Take 100 mg by mouth daily. [provider] Taking Active   b complex vitamins tablet [id number] Yes Take 1 tablet by mouth daily. [provider] Taking Active   betamethasone acetate-betamethasone sodium phosphate (CELESTONE) injection 3 mg [id number]   Edrick Kins, DPM  Active   BIOTIN PO [id number] Yes Take by mouth. [provider] Taking Active   Blood Glucose Monitoring Suppl (ONE TOUCH ULTRA SYSTEM KIT) W/DEVICE KIT [id number] Yes 1 kit by Does not apply route once. Crecencio Mc, MD Taking Active   budesonide-formoterol [facility]) 80-4.5 MCG/ACT inhaler [id number] Yes 1-2 puffs BID PRN. May use 1-2 puffs Q4H PRN symptom flares, maximum dose 12 inhalations per day Crecencio Mc, MD Taking Active   Cholecalciferol (VITAMIN D) 125 MCG (5000 UT) CAPS [id number] Yes Take 1 capsule by mouth. Twice weekly [provider] Taking Active   citalopram (CELEXA) 40 MG tablet [id number] Yes Take 40 mg by mouth every morning. [provider] Taking Active   clonazePAM (  KLONOPIN) 0.5 MG tablet [id number] Yes Take 0.25 mg by mouth 2 (two) times daily as needed. [provider] Taking Active   COLLAGEN PO [id number] Yes Take by mouth. [provider] Taking Active   Continuous Blood Gluc Receiver ([place]) DEVI [id number]  Use to check blood sugars before and after meals Crecencio Mc, MD  Active   Continuous Blood Gluc Sensor (DEXCOM G6 SENSOR) MISC [id number]  Use to check blood sugars  before  and after each meal Crecencio Mc, MD  Active   Continuous Blood Gluc Transmit (DEXCOM G6 TRANSMITTER) MISC [id number]  Use to check blood sugars up to 4 times daily. Crecencio Mc, MD  Active   cyanocobalamin (,VITAMIN B-12,) 1000 MCG/ML injection [id number] No INJECT 1 ML IN THE MUSCLE DAILY FOR 3 DAYS, WEEKLY FOR 2 THEN MONTHLY THEREAFTER  Patient not taking: Reported on 01/13/2021   Crecencio Mc, MD Not Taking Active   DULoxetine (CYMBALTA) 30 MG capsule [id number] Yes Take 30 mg by mouth daily. [provider] Taking Active   famotidine (PEPCID) 20 MG tablet [id number] Yes TAKE 1 TABLET(20 MG) BY MOUTH TWICE DAILY Crecencio Mc, MD Taking Active   ferrous fumarate (HEMOCYTE - 106 MG FE) 325 (106 FE) MG TABS [id number] Yes Take 1 tablet by mouth. [provider] Taking Active            Med Note Darnelle Maffucci, Arville Lime   Wed Nov 30, 2020  1:55 PM)    glucose blood [facility] VERIO) test strip [id number]  Use to check blood sugars up to four times daily. Crecencio Mc, MD  Active   Insulin Pen Needle (B-D UF III MINI PEN NEEDLES) 31G X 5 MM MISC [id number] Yes USE AS DIRECTED THREE TIMES DAILY Crecencio Mc, MD Taking Active   Lancets [facility] ULTRASOFT) lancets [id number]  Use as instructed Crecencio Mc, MD  Active   levothyroxine (SYNTHROID) 175 MCG tablet [id number] Yes TAKE 1 TABLET(175 MCG) BY MOUTH DAILY Crecencio Mc, MD Taking Active   metoprolol succinate (TOPROL-XL) 50 MG 24 hr tablet [id number] Yes TAKE 1 TABLET BY MOUTH DAILY WITH OR IMMEDIATELY FOLLOWING A MEAL Crecencio Mc, MD Taking Active   montelukast (SINGULAIR) 10 MG tablet [id number] Yes TAKE 1 TABLET(10 MG) BY MOUTH AT BEDTIME Crecencio Mc, MD Taking Active   mupirocin ointment (BACTROBAN) 2 % [id number] Yes Apply 1 application topically 2 (two) times daily. Crecencio Mc, MD  Taking Active   nystatin (MYCOSTATIN/NYSTOP) powder [id number] Yes Apply topically 2 (two) times daily. on affected areas. Hassell Done, Mary-Margaret, FNP Taking Active   spironolactone (ALDACTONE) 100 MG tablet [id number] Yes TAKE 1 TABLET(100 MG) BY MOUTH TWICE DAILY Crecencio Mc, MD Taking Active   Syringe/Needle, Disp, (SYRINGE 3CC/25GX1") 25G X 1" 3 ML MISC [id number]  Use for b12 injections Crecencio Mc, MD  Active   tirzepatide [facility]) 5 MG/0.5ML Pen [id number] No Inject 5 mg into the skin once a week.  Patient not taking: Reported on 01/13/2021   Crecencio Mc, MD Not Taking Active   traZODone (DESYREL) 50 MG tablet [id number] No Take 50 mg by mouth at bedtime as needed.  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   Carotid artery calcification, bilateral 12/21/2017   Mass of upper outer quadrant of left breast 06/18/2017   Leukocytosis 06/05/2017   Anxiety state 07/20/2015   Fatigue due to sleep pattern disturbance 03/03/2015   Vitamin B12 deficiency anemia due to intrinsic factor deficiency 03/03/2015   Vitamin D deficiency 03/03/2015   Hyperlipidemia associated with type 2 diabetes mellitus ([place]) 07/24/2012   Type 2 diabetes mellitus with obesity ([place]) 04/28/2012   Screening for breast cancer 03/28/2011   Chronic back pain greater than 3 months duration    Screening for cervical cancer 03/27/2011   Tobacco abuse    Intrinsic asthma    Hypertension    Polycystic ovarian syndrome    Hypothyroidism due to acquired atrophy of thyroid    Obstructive sleep apnea 12/13/2010   Obesity (BMI 30-39.9) 12/13/2010    Conditions to be addressed/monitored: HLD and DMII  Care Plan : Medication Management  Updates made by De Hollingshead,  RPH-CPP since 02/06/2021 12:00 AM     Problem: Diabetes, HLD      Long-Range Goal: Disease Progression Prevention   Start Date: 11/30/2020  Recent Progress: On track  Priority: High  Note:   Current Barriers:  Unable to achieve control of diabetes   Pharmacist Clinical Goal(s):  Over the next 90 days, patient will achieve control of diabetes as evidenced by A1c  through collaboration with PharmD and provider.   Interventions: 1:1 collaboration with Crecencio Mc, MD regarding development and update of comprehensive plan of care as evidenced by provider attestation and co-signature Inter-disciplinary care team collaboration (see longitudinal plan of care) Comprehensive medication review performed; medication list updated in electronic medical record  Diabetes: Uncontrolled; current treatment: insulin 70/30 15 units BID - though taking PRN; has not started [facility] yet as she wanted to see if she tolerated new medication from psychiatry first (duloxetine) Reports hypoglycemic symptoms. Treats with a Mt Dew.  Current meal patterns: breakfast: skips; lunch and supper are biggest meals, fast food. Does report that she has gone back to cooking at home - baking chicken, air fryer. Meat, starch, green; drinks: unsweet tea, sometimes equal or lemon juice added; occasional Mt Dew, but to treat low blood sugars Current exercise: none, though notes she has a home gym. Is getting outside more, tries to walk around more.  Received PA request for Mounjaro 2.5 mg weekly. Contacted patient to confirm dose. She would like to increase to Mounjaro 5 mg weekly. Submitted PA request for Mounjaro 5 mg weekly via Cover My Meds today.   Hypertension: Controlled; current treatment: spironolactone 100 mg BID (PCOS), metoprolol succinate 50 mg daily;  Previously recommended to continue current regimen at this time  Hyperlipidemia: Unmanaged; current treatment: none;  Medications previously tried: none   Current dietary patterns: high in fats Given diabetes, ASCVD risk score >10%, recommend initiation of moderate intensity statin. Will discuss moving forward.   Depression/Anxiety/Agoraphobia: Moderately well managed; current treatment: citalopram 40 mg daily, clonazepam 0.25 mg BID PRN Previously recommended to continue current regimen at this time  Asthma: Controlled; current regimen: Symbicort 80/4.5 mcg 2 puffs BID PRN albuterol HFA PRN, montelukast 10 mg daily  Reports significant improvement in breathing since restarting Symbicort Recommended to continue current regimen at this time  Hypothyroidism: Uncontrolled on last check; current regimen: levothyroxine 175 mcg daily Follow up with PCP as previously scheduled for follow up lab work  GERD: Controlled per patient report; current regimen: famotidine 20 mg BID,  Maalox as needed Continue current regimen at this time  Supplement: Vitamin C, biotin, B complex, Vitamin D, Vitamin B12, ferrous sulfate PRN   Patient Goals/Self-Care Activities Over the next 90 days, patient will:  - take medications as prescribed target a minimum of 150 minutes of moderate intensity exercise weekly engage in dietary modifications by moderating carbohydrate intake  Follow Up Plan: Telephone follow up appointment with care management team member scheduled for: 1 weeks      Medication Assistance:  None required.  Patient affirms current coverage meets needs.  Follow Up:  Patient agrees to Care Plan and Follow-up.  Plan: Telephone follow up appointment with care management team member scheduled for:  1 weeks  Catie Darnelle Maffucci, PharmD, [place], [place] Clinical Pharmacist [employer] at [employer] [phone]

## 2021-02-07 ENCOUNTER — Ambulatory Visit: Payer: BC Managed Care – PPO | Admitting: Pharmacist

## 2021-02-07 DIAGNOSIS — E1169 Type 2 diabetes mellitus with other specified complication: Secondary | ICD-10-CM

## 2021-02-07 DIAGNOSIS — E785 Hyperlipidemia, unspecified: Secondary | ICD-10-CM

## 2021-02-07 DIAGNOSIS — E119 Type 2 diabetes mellitus without complications: Secondary | ICD-10-CM

## 2021-02-07 DIAGNOSIS — I10 Essential (primary) hypertension: Secondary | ICD-10-CM

## 2021-02-07 NOTE — Patient Instructions (Signed)
Visit Information  PATIENT GOALS:  Goals Addressed               This Visit's Progress     Patient Stated     Medication Monitoring (pt-stated)        Patient Goals/Self-Care Activities Over the next 90 days, patient will:  - take medications as prescribed target a minimum of 150 minutes of moderate intensity exercise weekly engage in dietary modifications by moderating carbohydrate intake         Patient verbalizes understanding of instructions provided today and agrees to view in MyChart.   Plan: Telephone follow up appointment with care management team member scheduled for:  1 week  [name], PharmD, [place], CPP Clinical Pharmacist [employer] at [employer] [phone]

## 2021-02-07 NOTE — Chronic Care Management (AMB) (Signed)
Care Management   Pharmacy Note  02/07/2021 Name: Madeline Strickland MRN: [id number] DOB: [birth year]  Subjective: Madeline Strickland is a 50 y.o. year old female who is a primary care patient of Crecencio Mc, MD. The Care Management team was consulted for assistance with care management and care coordination needs.    Care coordination  for  medication access  in response to provider referral for pharmacy case management and/or care coordination services.   The patient was given information about Care Management services today including:  Care Management services includes personalized support from designated clinical staff supervised by the patient's primary care provider, including individualized plan of care and coordination with other care providers. 24/7 contact phone numbers for assistance for urgent and routine care needs. The patient may stop case management services at any time by phone call to the office staff.  Patient agreed to services and consent obtained.  Assessment:  Review of patient status, including review of consultants reports, laboratory and other test data, was performed as part of comprehensive evaluation and provision of chronic care management services.   SDOH (Social Determinants of Health) assessments and interventions performed:    Objective:  Lab Results  Component Value Date   CREATININE 1.13 08/31/2020   CREATININE 1.19 12/19/2017   CREATININE 1.02 06/03/2017    Lab Results  Component Value Date   HGBA1C 7.2 (H) 08/31/2020       Component Value Date/Time   CHOL 168 08/31/2020 1630   TRIG 117.0 08/31/2020 1630   HDL 38.70 (L) 08/31/2020 1630   CHOLHDL 4 08/31/2020 1630   VLDL 23.4 08/31/2020 1630   LDLCALC 106 (H) 08/31/2020 1630   LDLDIRECT 131.0 10/24/2015 0923   BP Readings from Last 3 Encounters:  05/09/20 117/60  01/22/20 117/72  09/10/19 (!) 145/85    Care Plan  Allergies  Allergen Reactions   Levofloxacin Anaphylaxis   Vicks Dayquil  Cold & Flu [Dm-Phenylephrine-Acetaminophen] Anaphylaxis    With levaquin   Avelox [Moxifloxacin Hcl In Nacl] Hives, Itching and Swelling   Latex Itching    Medications Reviewed Today     Reviewed by De Hollingshead, RPH-CPP (Pharmacist) on 01/13/21 at 1344  Med List Status: <None>   Medication Order Taking? Sig Documenting Provider Last Dose Status Informant  Alum & Mag Hydroxide-Simeth (MAALOX ADVANCED PO) [id number] Yes Take by mouth at bedtime. [provider] Taking Active   Ascorbic Acid (VITAMIN C) 100 MG tablet [id number] Yes Take 100 mg by mouth daily. [provider] Taking Active   b complex vitamins tablet [id number] Yes Take 1 tablet by mouth daily. [provider] Taking Active   betamethasone acetate-betamethasone sodium phosphate (CELESTONE) injection 3 mg [id number]   Edrick Kins, DPM  Active   BIOTIN PO [id number] Yes Take by mouth. [provider] Taking Active   Blood Glucose Monitoring Suppl (ONE TOUCH ULTRA SYSTEM KIT) W/DEVICE KIT [id number] Yes 1 kit by Does not apply route once. Crecencio Mc, MD Taking Active   budesonide-formoterol [facility]) 80-4.5 MCG/ACT inhaler [id number] Yes 1-2 puffs BID PRN. May use 1-2 puffs Q4H PRN symptom flares, maximum dose 12 inhalations per day Crecencio Mc, MD Taking Active   Cholecalciferol (VITAMIN D) 125 MCG (5000 UT) CAPS [id number] Yes Take 1 capsule by mouth. Twice weekly [provider] Taking Active   citalopram (CELEXA) 40 MG tablet [id number] Yes Take 40 mg by mouth every morning. [provider] Taking Active   clonazePAM (KLONOPIN) 0.5 MG  tablet [id number] Yes Take 0.25 mg by mouth 2 (two) times daily as needed. [provider] Taking Active   COLLAGEN PO [id number] Yes Take by mouth. [provider] Taking Active   Continuous Blood Gluc Receiver ([place]) DEVI [id number]  Use to check blood sugars before and after meals Crecencio Mc, MD   Active   Continuous Blood Gluc Sensor (DEXCOM G6 SENSOR) MISC [id number]  Use to check blood sugars  before  and after each meal Crecencio Mc, MD  Active   Continuous Blood Gluc Transmit (DEXCOM G6 TRANSMITTER) MISC [id number]  Use to check blood sugars up to 4 times daily. Crecencio Mc, MD  Active   cyanocobalamin (,VITAMIN B-12,) 1000 MCG/ML injection [id number] No INJECT 1 ML IN THE MUSCLE DAILY FOR 3 DAYS, WEEKLY FOR 2 THEN MONTHLY THEREAFTER  Patient not taking: Reported on 01/13/2021   Crecencio Mc, MD Not Taking Active   DULoxetine (CYMBALTA) 30 MG capsule [id number] Yes Take 30 mg by mouth daily. [provider] Taking Active   famotidine (PEPCID) 20 MG tablet [id number] Yes TAKE 1 TABLET(20 MG) BY MOUTH TWICE DAILY Crecencio Mc, MD Taking Active   ferrous fumarate (HEMOCYTE - 106 MG FE) 325 (106 FE) MG TABS [id number] Yes Take 1 tablet by mouth. [provider] Taking Active            Med Note Darnelle Maffucci, Arville Lime   Wed Nov 30, 2020  1:55 PM)    glucose blood [facility] VERIO) test strip [id number]  Use to check blood sugars up to four times daily. Crecencio Mc, MD  Active   Insulin Pen Needle (B-D UF III MINI PEN NEEDLES) 31G X 5 MM MISC [id number] Yes USE AS DIRECTED THREE TIMES DAILY Crecencio Mc, MD Taking Active   Lancets [facility] ULTRASOFT) lancets [id number]  Use as instructed Crecencio Mc, MD  Active   levothyroxine (SYNTHROID) 175 MCG tablet [id number] Yes TAKE 1 TABLET(175 MCG) BY MOUTH DAILY Crecencio Mc, MD Taking Active   metoprolol succinate (TOPROL-XL) 50 MG 24 hr tablet [id number] Yes TAKE 1 TABLET BY MOUTH DAILY WITH OR IMMEDIATELY FOLLOWING A MEAL Crecencio Mc, MD Taking Active   montelukast (SINGULAIR) 10 MG tablet [id number] Yes TAKE 1 TABLET(10 MG) BY MOUTH AT BEDTIME Crecencio Mc, MD Taking Active   mupirocin ointment (BACTROBAN) 2 % [id number] Yes Apply 1 application topically 2 (two) times daily. Crecencio Mc, MD Taking Active    nystatin (MYCOSTATIN/NYSTOP) powder [id number] Yes Apply topically 2 (two) times daily. on affected areas. Hassell Done, Mary-Margaret, FNP Taking Active   spironolactone (ALDACTONE) 100 MG tablet [id number] Yes TAKE 1 TABLET(100 MG) BY MOUTH TWICE DAILY Crecencio Mc, MD Taking Active   Syringe/Needle, Disp, (SYRINGE 3CC/25GX1") 25G X 1" 3 ML MISC [id number]  Use for b12 injections Crecencio Mc, MD  Active   tirzepatide [facility]) 5 MG/0.5ML Pen [id number] No Inject 5 mg into the skin once a week.  Patient not taking: Reported on 01/13/2021   Crecencio Mc, MD Not Taking Active   traZODone (DESYREL) 50 MG tablet [id number] No Take 50 mg by mouth at bedtime as needed.  Patient not taking: Reported on 01/13/2021   [provider] Not Taking Active             Patient Active Problem List   Diagnosis Date Noted   Nonadherence with dietary restriction 11/13/2020   History of COVID-19 05/09/2020   Agoraphobia  with panic attacks 02/19/2019   Carotid artery calcification, bilateral 12/21/2017   Mass of upper outer quadrant of left breast 06/18/2017   Leukocytosis 06/05/2017   Anxiety state 07/20/2015   Fatigue due to sleep pattern disturbance 03/03/2015   Vitamin B12 deficiency anemia due to intrinsic factor deficiency 03/03/2015   Vitamin D deficiency 03/03/2015   Hyperlipidemia associated with type 2 diabetes mellitus ([place]) 07/24/2012   Type 2 diabetes mellitus with obesity ([place]) 04/28/2012   Screening for breast cancer 03/28/2011   Chronic back pain greater than 3 months duration    Screening for cervical cancer 03/27/2011   Tobacco abuse    Intrinsic asthma    Hypertension    Polycystic ovarian syndrome    Hypothyroidism due to acquired atrophy of thyroid    Obstructive sleep apnea 12/13/2010   Obesity (BMI 30-39.9) 12/13/2010    Conditions to be addressed/monitored: HLD and DMII  Care Plan : Medication Management  Updates made by De Hollingshead, RPH-CPP since  02/07/2021 12:00 AM     Problem: Diabetes, HLD      Long-Range Goal: Disease Progression Prevention   Start Date: 11/30/2020  Recent Progress: On track  Priority: High  Note:   Current Barriers:  Unable to achieve control of diabetes   Pharmacist Clinical Goal(s):  Over the next 90 days, patient will achieve control of diabetes as evidenced by A1c  through collaboration with PharmD and provider.   Interventions: 1:1 collaboration with Crecencio Mc, MD regarding development and update of comprehensive plan of care as evidenced by provider attestation and co-signature Inter-disciplinary care team collaboration (see longitudinal plan of care) Comprehensive medication review performed; medication list updated in electronic medical record  Diabetes: Uncontrolled; current treatment: insulin 70/30 15 units BID - though taking PRN; Mounjaro 2.5 mg weekly Previously tried: metformin XR, GI upset Reports tolerability of Mounjaro. Interested in increasing to 5 mg weekly.  Current meal patterns: breakfast: skips; lunch and supper are biggest meals, fast food. Does report that she has gone back to cooking at home - baking chicken, air fryer. Meat, starch, green; drinks: unsweet tea, sometimes equal or lemon juice added; occasional Mt Dew, but to treat low blood sugars Current exercise: home gym PA for Mounjaro 5 mg denied as patient has not tried and failed all formulary alternatives (Ozempic, Rybelsus, Victoza, and Trulicity). These options are not optimal for patient for treatment of T2DM and concurrent obesity. The SURPASS clinical trial demonstrated superior A1c reduction to Ozempic with Mounjaro (average of 2.3% A1c reduction), as compared to only 2.2% average reduction with max Ozempic dose in the SUSTAIN FORTE clinical trial. Rybelsus only demonstrated 1.1% reduction in the PIONEER clinical trials, while Trulicity only showed 3.2% reduction in the AWARD clinical trials. Patient has tried and  failed liraglutide therapy previously (in [place]), so inappropriate to trial Victoza. Will submit appeal for Mounjaro coverage to patient's insurance.   Hypertension: Controlled; current treatment: spironolactone 100 mg BID (PCOS), metoprolol succinate 50 mg daily;  Previously recommended to continue current regimen at this time  Hyperlipidemia: Unmanaged; current treatment: none;  Medications previously tried: none  Current dietary patterns: high in fats Given diabetes, ASCVD risk score >10%, recommend initiation of moderate intensity statin. Will discuss moving forward.   Depression/Anxiety/Agoraphobia: Moderately well managed; current treatment: citalopram 40 mg daily, clonazepam 0.25 mg BID PRN Previously recommended to continue current regimen at this time  Asthma: Controlled; current regimen: Symbicort 80/4.5 mcg 2 puffs BID PRN albuterol HFA PRN, montelukast 10  mg daily  Reports significant improvement in breathing since restarting Symbicort Recommended to continue current regimen at this time  Hypothyroidism: Uncontrolled on last check; current regimen: levothyroxine 175 mcg daily Follow up with PCP as previously scheduled for follow up lab work  GERD: Controlled per patient report; current regimen: famotidine 20 mg BID, Maalox as needed Continue current regimen at this time  Supplement: Vitamin C, biotin, B complex, Vitamin D, Vitamin B12, ferrous sulfate PRN   Patient Goals/Self-Care Activities Over the next 90 days, patient will:  - take medications as prescribed target a minimum of 150 minutes of moderate intensity exercise weekly engage in dietary modifications by moderating carbohydrate intake  Follow Up Plan: Telephone follow up appointment with care management team member scheduled for: 1 weeks      Medication Assistance:  None required.  Patient affirms current coverage meets needs.  Follow Up:  Patient agrees to Care Plan and Follow-up.  Plan:  Telephone follow up appointment with care management team member scheduled for:  1 week  Catie Darnelle Maffucci, PharmD, [place], [place] Clinical Pharmacist [employer] at [employer] [phone]

## 2021-02-09 ENCOUNTER — Ambulatory Visit: Payer: BC Managed Care – PPO | Admitting: Pharmacist

## 2021-02-09 DIAGNOSIS — E1169 Type 2 diabetes mellitus with other specified complication: Secondary | ICD-10-CM

## 2021-02-09 DIAGNOSIS — E669 Obesity, unspecified: Secondary | ICD-10-CM

## 2021-02-09 NOTE — Patient Instructions (Signed)
Madeline Strickland,   Increase Mounjaro to 5 mg weekly. Check your email (including junk) for the link to connect to our practice DexCom Clarity portal.   Take care!  [name], PharmD  Visit Information   Goals Addressed               This Visit's Progress     Patient Stated     Medication Monitoring (pt-stated)        Patient Goals/Self-Care Activities Over the next 90 days, patient will:  - take medications as prescribed target a minimum of 150 minutes of moderate intensity exercise weekly engage in dietary modifications by moderating carbohydrate intake        Patient verbalizes understanding of instructions provided today and agrees to view in MyChart.    Plan: Telephone follow up appointment with care management team member scheduled for:  6 weeks  [name], PharmD, [place], CPP Clinical Pharmacist [employer] at [employer] [phone]

## 2021-02-09 NOTE — Chronic Care Management (AMB) (Signed)
Care Management   Pharmacy Note  02/09/2021 Name: ILISSA ROSNER MRN: [id number] DOB: [birth year]  Subjective: Madeline Strickland is a 50 y.o. year old female who is a primary care patient of Crecencio Mc, MD. The Care Management team was consulted for assistance with care management and care coordination needs.    Engaged with patient by telephone for follow up visit in response to provider referral for pharmacy case management and/or care coordination services.   The patient was given information about Care Management services today including:  Care Management services includes personalized support from designated clinical staff supervised by the patient's primary care provider, including individualized plan of care and coordination with other care providers. 24/7 contact phone numbers for assistance for urgent and routine care needs. The patient may stop case management services at any time by phone call to the office staff.  Patient agreed to services and consent obtained.  Assessment:  Review of patient status, including review of consultants reports, laboratory and other test data, was performed as part of comprehensive evaluation and provision of chronic care management services.   SDOH (Social Determinants of Health) assessments and interventions performed:  SDOH Interventions    Flowsheet Row Most Recent Value  SDOH Interventions   Financial Strain Interventions Intervention Not Indicated        Objective:  Lab Results  Component Value Date   CREATININE 1.13 08/31/2020   CREATININE 1.19 12/19/2017   CREATININE 1.02 06/03/2017    Lab Results  Component Value Date   HGBA1C 7.2 (H) 08/31/2020       Component Value Date/Time   CHOL 168 08/31/2020 1630   TRIG 117.0 08/31/2020 1630   HDL 38.70 (L) 08/31/2020 1630   CHOLHDL 4 08/31/2020 1630   VLDL 23.4 08/31/2020 1630   LDLCALC 106 (H) 08/31/2020 1630   LDLDIRECT 131.0 10/24/2015 0923      BP Readings from Last 3  Encounters:  05/09/20 117/60  01/22/20 117/72  09/10/19 (!) 145/85    Care Plan  Allergies  Allergen Reactions   Levofloxacin Anaphylaxis   Vicks Dayquil Cold & Flu [Dm-Phenylephrine-Acetaminophen] Anaphylaxis    With levaquin   Avelox [Moxifloxacin Hcl In Nacl] Hives, Itching and Swelling   Latex Itching    Medications Reviewed Today     Reviewed by De Hollingshead, RPH-CPP (Pharmacist) on 01/13/21 at 1344  Med List Status: <None>   Medication Order Taking? Sig Documenting Provider Last Dose Status Informant  Alum & Mag Hydroxide-Simeth (MAALOX ADVANCED PO) [id number] Yes Take by mouth at bedtime. [provider] Taking Active   Ascorbic Acid (VITAMIN C) 100 MG tablet [id number] Yes Take 100 mg by mouth daily. [provider] Taking Active   b complex vitamins tablet [id number] Yes Take 1 tablet by mouth daily. [provider] Taking Active   betamethasone acetate-betamethasone sodium phosphate (CELESTONE) injection 3 mg [id number]   Edrick Kins, DPM  Active   BIOTIN PO [id number] Yes Take by mouth. [provider] Taking Active   Blood Glucose Monitoring Suppl (ONE TOUCH ULTRA SYSTEM KIT) W/DEVICE KIT [id number] Yes 1 kit by Does not apply route once. Crecencio Mc, MD Taking Active   budesonide-formoterol [facility]) 80-4.5 MCG/ACT inhaler [id number] Yes 1-2 puffs BID PRN. May use 1-2 puffs Q4H PRN symptom flares, maximum dose 12 inhalations per day Crecencio Mc, MD Taking Active   Cholecalciferol (VITAMIN D) 125 MCG (5000 UT) CAPS [id number] Yes Take 1 capsule by mouth. Twice weekly [provider] Taking Active   citalopram (CELEXA) 40 MG tablet [id number] Yes Take 40 mg by mouth every morning. [provider] Taking Active   clonazePAM (KLONOPIN) 0.5 MG tablet [id number] Yes Take 0.25 mg by mouth 2 (two) times daily as needed. [provider] Taking Active   COLLAGEN PO [id number] Yes Take by mouth. [provider] Taking Active   Continuous Blood Gluc Receiver ([place]) DEVI [id number]  Use to check blood sugars before and after meals Crecencio Mc, MD  Active   Continuous Blood Gluc Sensor (DEXCOM G6 SENSOR) MISC [id number]  Use to check blood sugars  before  and after each meal Crecencio Mc, MD  Active   Continuous Blood Gluc Transmit (DEXCOM G6 TRANSMITTER) MISC [id number]  Use to check blood sugars up to 4 times daily. Crecencio Mc, MD  Active   cyanocobalamin (,VITAMIN B-12,) 1000 MCG/ML injection [id number] No INJECT 1 ML IN THE MUSCLE DAILY FOR 3 DAYS, WEEKLY FOR 2 THEN MONTHLY THEREAFTER  Patient not taking: Reported on 01/13/2021   Crecencio Mc, MD Not Taking Active   DULoxetine (CYMBALTA) 30 MG capsule [id number] Yes Take 30 mg by mouth daily. [provider] Taking Active   famotidine (PEPCID) 20 MG tablet [id number] Yes TAKE 1 TABLET(20 MG) BY MOUTH TWICE DAILY Crecencio Mc, MD Taking Active   ferrous fumarate (HEMOCYTE - 106 MG FE) 325 (106 FE) MG TABS [id number] Yes Take 1 tablet by mouth. [provider] Taking Active            Med Note Darnelle Maffucci, Arville Lime   Wed Nov 30, 2020  1:55 PM)    glucose blood [facility] VERIO) test strip [id number]  Use to check blood sugars up to four times daily. Crecencio Mc, MD  Active   Insulin Pen Needle (B-D UF III MINI PEN NEEDLES) 31G X 5 MM MISC [id number] Yes USE AS DIRECTED THREE TIMES DAILY Crecencio Mc, MD Taking Active   Lancets [facility] ULTRASOFT) lancets [id number]  Use as instructed Crecencio Mc, MD  Active   levothyroxine (SYNTHROID) 175 MCG tablet [id number] Yes TAKE 1 TABLET(175 MCG) BY MOUTH DAILY Crecencio Mc, MD Taking Active   metoprolol succinate (TOPROL-XL) 50 MG 24 hr tablet [id number] Yes TAKE 1 TABLET BY MOUTH DAILY WITH OR IMMEDIATELY FOLLOWING A MEAL Crecencio Mc, MD Taking Active   montelukast (SINGULAIR) 10 MG tablet [id number] Yes TAKE 1 TABLET(10 MG) BY MOUTH AT BEDTIME  Crecencio Mc, MD Taking Active   mupirocin ointment (BACTROBAN) 2 % [id number] Yes Apply 1 application topically 2 (two) times daily. Crecencio Mc, MD Taking Active   nystatin (MYCOSTATIN/NYSTOP) powder [id number] Yes Apply topically 2 (two) times daily. on affected areas. Hassell Done, Mary-Margaret, FNP Taking Active   spironolactone (ALDACTONE) 100 MG tablet [id number] Yes TAKE 1 TABLET(100 MG) BY MOUTH TWICE DAILY Crecencio Mc, MD Taking Active   Syringe/Needle, Disp, (SYRINGE 3CC/25GX1") 25G X 1" 3 ML MISC [id number]  Use for b12 injections Crecencio Mc, MD  Active   tirzepatide [facility]) 5 MG/0.5ML Pen [id number] No Inject 5 mg into the skin once a week.  Patient not taking: Reported on 01/13/2021   Crecencio Mc, MD Not Taking Active   traZODone (DESYREL) 50 MG tablet [id number] No Take 50 mg by mouth at bedtime as needed.  Patient not taking: Reported on 01/13/2021   [provider] Not Taking Active  Patient Active Problem List   Diagnosis Date Noted   Nonadherence with dietary restriction 11/13/2020   History of COVID-19 05/09/2020   Agoraphobia with panic attacks 02/19/2019   Carotid artery calcification, bilateral 12/21/2017   Mass of upper outer quadrant of left breast 06/18/2017   Leukocytosis 06/05/2017   Anxiety state 07/20/2015   Fatigue due to sleep pattern disturbance 03/03/2015   Vitamin B12 deficiency anemia due to intrinsic factor deficiency 03/03/2015   Vitamin D deficiency 03/03/2015   Hyperlipidemia associated with type 2 diabetes mellitus ([place]) 07/24/2012   Type 2 diabetes mellitus with obesity ([place]) 04/28/2012   Screening for breast cancer 03/28/2011   Chronic back pain greater than 3 months duration    Screening for cervical cancer 03/27/2011   Tobacco abuse    Intrinsic asthma    Hypertension    Polycystic ovarian syndrome    Hypothyroidism due to acquired atrophy of thyroid    Obstructive sleep apnea 12/13/2010   Obesity (BMI  30-39.9) 12/13/2010    Conditions to be addressed/monitored: HLD and DMII  Care Plan : Medication Management  Updates made by De Hollingshead, RPH-CPP since 02/09/2021 12:00 AM     Problem: Diabetes, HLD      Long-Range Goal: Disease Progression Prevention   Start Date: 11/30/2020  Recent Progress: On track  Priority: High  Note:   Current Barriers:  Unable to achieve control of diabetes   Pharmacist Clinical Goal(s):  Over the next 90 days, patient will achieve control of diabetes as evidenced by A1c  through collaboration with PharmD and provider.   Interventions: 1:1 collaboration with Crecencio Mc, MD regarding development and update of comprehensive plan of care as evidenced by provider attestation and co-signature Inter-disciplinary care team collaboration (see longitudinal plan of care) Comprehensive medication review performed; medication list updated in electronic medical record  Diabetes: Uncontrolled but improving; current treatment:  Mounjaro 2.5 mg weekly Reports she is tolerating Mounjaro well. Decrease fatty food intake and serving sizes.  Previously tried: metformin XR, GI upset Using DexCom. Amenable to connecting to patient portal. Clarity invite sent today.  Reports tolerability of Mounjaro. Interested in increasing to 5 mg weekly.  Awaiting results of Appeal. However, savings card will provide coverage if insurance rejects. BIN: [id number], PCN: [id number], Group: [id number], ID: [id number]. Called information to [employer] and script ran through for $25/month. Patient will increase to 5 mg weekly.  Advised to contact me if she would like to increase to 7.5 mg at the end of 5 mg dose. Follow up via telephone in 6 weeks.   Hypertension: Controlled; current treatment: spironolactone 100 mg BID (PCOS), metoprolol succinate 50 mg daily;  Previously recommended to continue current regimen at this time  Hyperlipidemia: Unmanaged; current treatment: none;  Medications  previously tried: none  Current dietary patterns: high in fats Given diabetes, ASCVD risk score >10%, recommend initiation of moderate intensity statin. Will discuss moving forward.   Depression/Anxiety/Agoraphobia: Moderately well managed; current treatment: citalopram 40 mg daily, clonazepam 0.25 mg BID PRN Previously recommended to continue current regimen at this time  Asthma: Controlled; current regimen: Symbicort 80/4.5 mcg 2 puffs BID PRN albuterol HFA PRN, montelukast 10 mg daily  Reports significant improvement in breathing since restarting Symbicort Recommended to continue current regimen at this time  Hypothyroidism: Uncontrolled on last check; current regimen: levothyroxine 175 mcg daily Follow up with PCP as previously scheduled for follow up lab work  GERD: Controlled per patient report; current regimen: famotidine 20 mg BID, Maalox  as needed Continue current regimen at this time  Supplement: Vitamin C, biotin, B complex, Vitamin D, Vitamin B12, ferrous sulfate PRN   Patient Goals/Self-Care Activities Over the next 90 days, patient will:  - take medications as prescribed target a minimum of 150 minutes of moderate intensity exercise weekly engage in dietary modifications by moderating carbohydrate intake  Follow Up Plan: Telephone follow up appointment with care management team member scheduled for: 6 weeks      Medication Assistance:  None required.  Patient affirms current coverage meets needs.  Follow Up:  Patient agrees to Care Plan and Follow-up.  Plan: Telephone follow up appointment with care management team member scheduled for:  6 weeks  Catie Darnelle Maffucci, PharmD, [place], [place] Clinical Pharmacist [employer] at [employer] [phone]

## 2021-03-17 ENCOUNTER — Other Ambulatory Visit: Payer: Self-pay | Admitting: Internal Medicine

## 2021-03-18 NOTE — Addendum Note (Signed)
Addended by: Warden Fillers on: 03/18/2021 08:35 AM   Modules accepted: Orders

## 2021-03-23 ENCOUNTER — Ambulatory Visit: Payer: BC Managed Care – PPO | Admitting: Pharmacist

## 2021-03-23 DIAGNOSIS — E669 Obesity, unspecified: Secondary | ICD-10-CM

## 2021-03-23 DIAGNOSIS — E1169 Type 2 diabetes mellitus with other specified complication: Secondary | ICD-10-CM

## 2021-03-23 DIAGNOSIS — I10 Essential (primary) hypertension: Secondary | ICD-10-CM

## 2021-03-23 DIAGNOSIS — J45909 Unspecified asthma, uncomplicated: Secondary | ICD-10-CM

## 2021-03-23 MED ORDER — TIRZEPATIDE 7.5 MG/0.5ML ~~LOC~~ SOAJ
7.5000 mg | SUBCUTANEOUS | 2 refills | Status: DC
Start: 2021-03-23 — End: 2021-06-08

## 2021-03-23 NOTE — Chronic Care Management (AMB) (Signed)
Chronic Care Management CCM Pharmacy Note  03/23/2021 Name:  Madeline Strickland MRN:  [id number] DOB:  Mar 17, 1971  Summary: Madeline Strickland. Denies weight benefit yet  Recommendations/Changes made from today's visit: - Increase Mounjaro to 7.5 mg weekly  Subjective: Madeline Strickland is an 50 y.o. year old female who is a primary patient of Tullo, Aris Everts, MD.  The CCM team was consulted for assistance with disease management and care coordination needs.    Engaged with patient by telephone for follow up visit for pharmacy case management and/or care coordination services.   Objective:  Medications Reviewed Today     Reviewed by De Hollingshead, RPH-CPP (Pharmacist) on 01/13/21 at 1344  Med List Status: <None>   Medication Order Taking? Sig Documenting Provider Last Dose Status Informant  Alum & Mag Hydroxide-Simeth (MAALOX ADVANCED PO) [id number] Yes Take by mouth at bedtime. [provider] Taking Active   Ascorbic Acid (VITAMIN C) 100 MG tablet [id number] Yes Take 100 mg by mouth daily. [provider] Taking Active   b complex vitamins tablet [id number] Yes Take 1 tablet by mouth daily. [provider] Taking Active   betamethasone acetate-betamethasone sodium phosphate (CELESTONE) injection 3 mg [id number]   Edrick Kins, DPM  Active   BIOTIN PO [id number] Yes Take by mouth. [provider] Taking Active   Blood Glucose Monitoring Suppl (ONE TOUCH ULTRA SYSTEM KIT) W/DEVICE KIT [id number] Yes 1 kit by Does not apply route once. Crecencio Mc, MD Taking Active   budesonide-formoterol [facility]) 80-4.5 MCG/ACT inhaler [id number] Yes 1-2 puffs BID PRN. May use 1-2 puffs Q4H PRN symptom flares, maximum dose 12 inhalations per day Crecencio Mc, MD Taking Active   Cholecalciferol (VITAMIN D) 125 MCG (5000 UT) CAPS [id number] Yes Take 1 capsule by mouth. Twice weekly [provider] Taking Active   citalopram (CELEXA) 40 MG tablet [id number] Yes  Take 40 mg by mouth every morning. [provider] Taking Active   clonazePAM (KLONOPIN) 0.5 MG tablet [id number] Yes Take 0.25 mg by mouth 2 (two) times daily as needed. [provider] Taking Active   COLLAGEN PO [id number] Yes Take by mouth. [provider] Taking Active   Continuous Blood Gluc Receiver ([place]) DEVI [id number]  Use to check blood sugars before and after meals Crecencio Mc, MD  Active   Continuous Blood Gluc Sensor (DEXCOM G6 SENSOR) MISC [id number]  Use to check blood sugars  before  and after each meal Crecencio Mc, MD  Active   Continuous Blood Gluc Transmit (DEXCOM G6 TRANSMITTER) MISC [id number]  Use to check blood sugars up to 4 times daily. Crecencio Mc, MD  Active   cyanocobalamin (,VITAMIN B-12,) 1000 MCG/ML injection [id number] No INJECT 1 ML IN THE MUSCLE DAILY FOR 3 DAYS, WEEKLY FOR 2 THEN MONTHLY THEREAFTER  Patient not taking: Reported on 01/13/2021   Crecencio Mc, MD Not Taking Active   DULoxetine (CYMBALTA) 30 MG capsule [id number] Yes Take 30 mg by mouth daily. [provider] Taking Active   famotidine (PEPCID) 20 MG tablet [id number] Yes TAKE 1 TABLET(20 MG) BY MOUTH TWICE DAILY Crecencio Mc, MD Taking Active   ferrous fumarate (HEMOCYTE - 106 MG FE) 325 (106 FE) MG TABS [id number] Yes Take 1 tablet by mouth. [provider] Taking Active            Med Note Darnelle Maffucci, Arville Lime   Wed Nov 30, 2020  1:55 PM)  glucose blood (ONETOUCH VERIO) test strip [id number]  Use to check blood sugars up to four times daily. Crecencio Mc, MD  Active   Insulin Pen Needle (B-D UF III MINI PEN NEEDLES) 31G X 5 MM MISC [id number] Yes USE AS DIRECTED THREE TIMES DAILY Crecencio Mc, MD Taking Active   Lancets [facility] ULTRASOFT) lancets [id number]  Use as instructed Crecencio Mc, MD  Active   levothyroxine (SYNTHROID) 175 MCG tablet [id number] Yes TAKE 1 TABLET(175 MCG) BY MOUTH DAILY Crecencio Mc, MD Taking  Active   metoprolol succinate (TOPROL-XL) 50 MG 24 hr tablet [id number] Yes TAKE 1 TABLET BY MOUTH DAILY WITH OR IMMEDIATELY FOLLOWING A MEAL Crecencio Mc, MD Taking Active   montelukast (SINGULAIR) 10 MG tablet [id number] Yes TAKE 1 TABLET(10 MG) BY MOUTH AT BEDTIME Crecencio Mc, MD Taking Active   mupirocin ointment (BACTROBAN) 2 % [id number] Yes Apply 1 application topically 2 (two) times daily. Crecencio Mc, MD Taking Active   nystatin (MYCOSTATIN/NYSTOP) powder [id number] Yes Apply topically 2 (two) times daily. on affected areas. Hassell Done, Mary-Margaret, FNP Taking Active   spironolactone (ALDACTONE) 100 MG tablet [id number] Yes TAKE 1 TABLET(100 MG) BY MOUTH TWICE DAILY Crecencio Mc, MD Taking Active   Syringe/Needle, Disp, (SYRINGE 3CC/25GX1") 25G X 1" 3 ML MISC [id number]  Use for b12 injections Crecencio Mc, MD  Active   tirzepatide [facility]) 5 MG/0.5ML Pen [id number] No Inject 5 mg into the skin once a week.  Patient not taking: Reported on 01/13/2021   Crecencio Mc, MD Not Taking Active   traZODone (DESYREL) 50 MG tablet [id number] No Take 50 mg by mouth at bedtime as needed.  Patient not taking: Reported on 01/13/2021   [provider] Not Taking Active             Pertinent Labs:   Lab Results  Component Value Date   HGBA1C 7.2 (H) 08/31/2020   Lab Results  Component Value Date   CHOL 168 08/31/2020   HDL 38.70 (L) 08/31/2020   LDLCALC 106 (H) 08/31/2020   LDLDIRECT 131.0 10/24/2015   TRIG 117.0 08/31/2020   CHOLHDL 4 08/31/2020   Lab Results  Component Value Date   CREATININE 1.13 08/31/2020   BUN 14 08/31/2020   NA 140 08/31/2020   K 4.0 08/31/2020   CL 108 08/31/2020   CO2 23 08/31/2020    SDOH:  (Social Determinants of Health) assessments and interventions performed:  SDOH Interventions    Flowsheet Row Most Recent Value  SDOH Interventions   Financial Strain Interventions Intervention Not Indicated       CCM Care Plan  Review  of patient past medical history, allergies, medications, health status, including review of consultants reports, laboratory and other test data, was performed as part of comprehensive evaluation and provision of chronic care management services.   Care Plan : Medication Management  Updates made by De Hollingshead, RPH-CPP since 03/23/2021 12:00 AM     Problem: Diabetes, HLD      Long-Range Goal: Disease Progression Prevention   Start Date: 11/30/2020  Recent Progress: On track  Priority: High  Note:   Current Barriers:  Unable to achieve control of diabetes   Pharmacist Clinical Goal(s):  Over the next 90 days, patient will achieve control of diabetes as evidenced by A1c  through collaboration with PharmD and provider.   Interventions: 1:1 collaboration with Crecencio Mc, MD regarding development and update of comprehensive plan of care  as evidenced by provider attestation and co-signature Inter-disciplinary care team collaboration (see longitudinal plan of care) Comprehensive medication review performed; medication list updated in electronic medical record  Diabetes: Controlled; current treatment: Mounjaro 5 mg weekly x 6 weeks Denies any acid reflux or GI upset with Mounjaro. Denies any weight loss yet Previously tried: metformin XR, GI upset Current glucose readings: using DexCom G6 Date of Download: 11/8-12/1/22 % Time CGM is active: 99.4% Average Glucose: 145 mg/dL Glucose Management Indicator: 6.8  Glucose Variability: 27 (goal <36%) Time in Goal:  - Time in range 70-180: 89% - Time above range: 11% - Time below range: 0% Given tolerability and lack of weight loss thus far, increase Mounjaro to 7.5 mg weekly after completing the 5 mg dose. Patient verbalizes understanding Encouraged to schedule lab work at her convenience.   Hypertension: Controlled; current treatment: spironolactone 100 mg BID (PCOS), metoprolol succinate 50 mg daily;  Previously recommended to  continue current regimen at this time  Hyperlipidemia: Unmanaged; current treatment: none;  Medications previously tried: none  Current dietary patterns: high in fats Given diabetes, ASCVD risk score >10%, recommend initiation of moderate intensity statin. Will discuss moving forward.   Depression/Anxiety/Agoraphobia: Moderately well managed; current treatment: citalopram 40 mg daily, clonazepam 0.25 mg BID PRN Previously recommended to continue current regimen at this time  Asthma: Controlled; current regimen: Symbicort 80/4.5 mcg 2 puffs BID PRN albuterol HFA PRN, montelukast 10 mg daily  Previously recommended to continue current regimen at this time  Hypothyroidism: Uncontrolled on last check; current regimen: levothyroxine 175 mcg daily Reviewed need for follow up lab work today.  GERD: Controlled per patient report; current regimen: famotidine 20 mg BID, Maalox as needed Previously recommended to continue current regimen at this time  Supplement: Vitamin C, biotin, B complex, Vitamin D, Vitamin B12, ferrous sulfate PRN   Patient Goals/Self-Care Activities Over the next 90 days, patient will:  - take medications as prescribed target a minimum of 150 minutes of moderate intensity exercise weekly engage in dietary modifications by moderating carbohydrate intake      Plan: Telephone follow up appointment with care management team member scheduled for:  8 weeks  Catie Darnelle Maffucci, PharmD, [place], [place] Clinical Pharmacist [employer] at [employer] [phone]

## 2021-03-23 NOTE — Patient Instructions (Signed)
[place],   Keep up the great work!  Complete what you have of the Mounjaro 5 mg, then increase to 7.5 mg weekly.   Let us know if you have any questions or concerns.   [name], PharmD

## 2021-04-27 ENCOUNTER — Other Ambulatory Visit: Payer: Self-pay | Admitting: Internal Medicine

## 2021-05-01 ENCOUNTER — Other Ambulatory Visit: Payer: Self-pay | Admitting: Internal Medicine

## 2021-05-18 ENCOUNTER — Telehealth: Payer: Self-pay | Admitting: Pharmacist

## 2021-05-18 NOTE — Telephone Encounter (Signed)
PA required for [facility]. Completed via Cover My Meds (Key [id number]). Will follow

## 2021-05-19 ENCOUNTER — Ambulatory Visit: Payer: BC Managed Care – PPO | Admitting: Pharmacist

## 2021-05-19 ENCOUNTER — Telehealth: Payer: Self-pay | Admitting: Pharmacist

## 2021-05-19 DIAGNOSIS — E785 Hyperlipidemia, unspecified: Secondary | ICD-10-CM

## 2021-05-19 DIAGNOSIS — I10 Essential (primary) hypertension: Secondary | ICD-10-CM

## 2021-05-19 DIAGNOSIS — E119 Type 2 diabetes mellitus without complications: Secondary | ICD-10-CM

## 2021-05-19 DIAGNOSIS — E1169 Type 2 diabetes mellitus with other specified complication: Secondary | ICD-10-CM

## 2021-05-19 NOTE — Telephone Encounter (Signed)
On visit today, patient reports worsening acid reflux at night.   She increased to Mounjaro 7.5 mg two weeks ago, and the worsening acid reflux seems to have coincided with that. She also reports worsening anxiety/insomnia, exacerbated by acid reflux preventing her from sleeping well.   She is taking famotidine 20 mg BID, appropriately taking ~ 30 minutes before a meal. She is using Mylanta and/or Pepto Bismol in the evenings. She reports a prior history of PPIs.   We discussed the profound impact of Mounjaro on gastric emptying and that this could be prolonging the duration of acid reflux, resulting in impacts on sleep. Confirmed that she is avoiding fatty or fried foods for supper, she reports she eats at least 3 hours before lying down.    She declines to step back down to Mounjaro 5 mg weekly. I anticipate this improving as she stays on a particular dose for longer.   Offered video visit with PCP to discuss, she declines at this time, noting she needs to get in and have lab work done first, and scheduling a lab visit worsens her anxiety. Routing to PCP.

## 2021-05-19 NOTE — Chronic Care Management (AMB) (Signed)
Chronic Care Management CCM Pharmacy Note  05/19/2021 Name:  Madeline Strickland MRN:  [id number] DOB:  [birth year]  Summary: - Glucose significantly improved. Reporting more acid reflux symptoms and worsening anxiety/insomnia.  Recommendations/Changes made from today's visit: - Encouraged to come in for follow up lab work - Encouraged to communicate with PCP about anxiety/insomnia - Continue current regimen at this time  Subjective: Madeline Strickland is an 51 y.o. year old female who is a primary patient of Derrel Nip, Aris Everts, MD.  The CCM team was consulted for assistance with disease management and care coordination needs.    Engaged with patient by telephone for follow up visit for pharmacy case management and/or care coordination services.   Objective:  Medications Reviewed Today     Reviewed by De Hollingshead, RPH-CPP (Pharmacist) on 05/19/21 at [place] List Status: <None>   Medication Order Taking? Sig Documenting Provider Last Dose Status Informant  Alum & Mag Hydroxide-Simeth (MAALOX ADVANCED PO) [id number]  Take by mouth at bedtime. [provider]  Active   Ascorbic Acid (VITAMIN C) 100 MG tablet [id number]  Take 100 mg by mouth daily. [provider]  Active   b complex vitamins tablet [id number]  Take 1 tablet by mouth daily. [provider]  Active   betamethasone acetate-betamethasone sodium phosphate (CELESTONE) injection 3 mg [id number]   Edrick Kins, DPM  Active   BIOTIN PO [id number]  Take by mouth. [provider]  Active   Blood Glucose Monitoring Suppl (ONE TOUCH ULTRA SYSTEM KIT) W/DEVICE KIT [id number]  1 kit by Does not apply route once. Crecencio Mc, MD  Active   budesonide-formoterol [facility]) 80-4.5 MCG/ACT inhaler [id number]  1-2 puffs BID PRN. May use 1-2 puffs Q4H PRN symptom flares, maximum dose 12 inhalations per day Crecencio Mc, MD  Active   Cholecalciferol (VITAMIN D) 125 MCG (5000 UT) CAPS [id number]  Take 1 capsule by  mouth. Twice weekly [provider]  Active   citalopram (CELEXA) 40 MG tablet [id number]  Take 40 mg by mouth every morning. [provider]  Active   clonazePAM (KLONOPIN) 0.5 MG tablet [id number]  Take 0.25 mg by mouth 2 (two) times daily as needed. [provider]  Active   COLLAGEN PO [id number]  Take by mouth. [provider]  Active   Continuous Blood Gluc Receiver ([place]) DEVI [id number]  Use to check blood sugars before and after meals Crecencio Mc, MD  Active   Continuous Blood Gluc Sensor (DEXCOM G6 SENSOR) MISC [id number]  Use to check blood sugars  before  and after each meal Crecencio Mc, MD  Active   Continuous Blood Gluc Transmit (DEXCOM G6 TRANSMITTER) MISC [id number]  Use to check blood sugars up to 4 times daily. Crecencio Mc, MD  Active   cyanocobalamin (,VITAMIN B-12,) 1000 MCG/ML injection [id number]  INJECT 1 ML IN THE MUSCLE DAILY FOR 3 DAYS, WEEKLY FOR 2 THEN MONTHLY THEREAFTER  Patient not taking: Reported on 01/13/2021   Crecencio Mc, MD  Active   DULoxetine (CYMBALTA) 30 MG capsule [id number]  Take 30 mg by mouth daily. [provider]  Active   famotidine (PEPCID) 20 MG tablet [id number]  TAKE 1 TABLET(20 MG) BY MOUTH TWICE DAILY Crecencio Mc, MD  Active   ferrous fumarate (HEMOCYTE - 106 MG FE) 325 (106 FE) MG TABS [id number]  Take 1 tablet by mouth. [provider]  Active  Med Note Darnelle Maffucci, Kenly Xiao E   Wed Nov 30, 2020  1:55 PM)    glucose blood [facility] VERIO) test strip [id number]  Use to check blood sugars up to four times daily. Crecencio Mc, MD  Active   Insulin Pen Needle (B-D UF III MINI PEN NEEDLES) 31G X 5 MM MISC [id number]  USE AS DIRECTED THREE TIMES DAILY Crecencio Mc, MD  Active   Lancets [facility] ULTRASOFT) lancets [id number]  Use as instructed Crecencio Mc, MD  Active   levothyroxine (SYNTHROID) 175 MCG tablet [id number]  TAKE 1 TABLET(175 MCG) BY MOUTH DAILY  Crecencio Mc, MD  Active   metoprolol succinate (TOPROL-XL) 50 MG 24 hr tablet [id number]  TAKE 1 TABLET BY MOUTH DAILY WITH OR IMMEDIATELY FOLLOWING A MEAL Crecencio Mc, MD  Active   montelukast (SINGULAIR) 10 MG tablet [id number]  TAKE 1 TABLET(10 MG) BY MOUTH AT BEDTIME Crecencio Mc, MD  Active   mupirocin ointment (BACTROBAN) 2 % [id number]  Apply 1 application topically 2 (two) times daily. Crecencio Mc, MD  Active   nystatin (MYCOSTATIN/NYSTOP) powder [id number]  Apply topically 2 (two) times daily. on affected areas. Hassell Done, Mary-Margaret, FNP  Active   spironolactone (ALDACTONE) 100 MG tablet [id number]  TAKE 1 TABLET(100 MG) BY MOUTH TWICE DAILY Crecencio Mc, MD  Active   Syringe/Needle, Disp, (SYRINGE 3CC/25GX1") 25G X 1" 3 ML MISC [id number]  Use for b12 injections Crecencio Mc, MD  Active   tirzepatide [facility]) 7.5 MG/0.5ML Pen [id number] Yes Inject 7.5 mg into the skin once a week. Crecencio Mc, MD Taking Active   traZODone (DESYREL) 50 MG tablet [id number]  Take 50 mg by mouth at bedtime as needed.  Patient not taking: Reported on 01/13/2021   [provider]  Active             Pertinent Labs:   Lab Results  Component Value Date   HGBA1C 7.2 (H) 08/31/2020   Lab Results  Component Value Date   CHOL 168 08/31/2020   HDL 38.70 (L) 08/31/2020   LDLCALC 106 (H) 08/31/2020   LDLDIRECT 131.0 10/24/2015   TRIG 117.0 08/31/2020   CHOLHDL 4 08/31/2020   Lab Results  Component Value Date   CREATININE 1.13 08/31/2020   BUN 14 08/31/2020   NA 140 08/31/2020   K 4.0 08/31/2020   CL 108 08/31/2020   CO2 23 08/31/2020    SDOH:  (Social Determinants of Health) assessments and interventions performed:    [place]  Review of patient past medical history, allergies, medications, health status, including review of consultants reports, laboratory and other test data, was performed as part of comprehensive evaluation and provision of chronic care  management services.   Care Plan : Medication Management  Updates made by De Hollingshead, RPH-CPP since 05/19/2021 12:00 AM     Problem: Diabetes, HLD      Long-Range Goal: Disease Progression Prevention   Start Date: 11/30/2020  Recent Progress: On track  Priority: High  Note:   Current Barriers:  Unable to achieve control of diabetes   Pharmacist Clinical Goal(s):  Over the next 90 days, patient will achieve control of diabetes as evidenced by A1c  through collaboration with PharmD and provider.   Interventions: 1:1 collaboration with Crecencio Mc, MD regarding development and update of comprehensive plan of care as evidenced by provider attestation and co-signature Inter-disciplinary care team collaboration (see longitudinal plan of care) Comprehensive medication review  performed; medication list updated in electronic medical record  Diabetes: Controlled; current treatment: Mounjaro 7.5 mg weekly x 2 weeks Reports worsening acid reflux since increasing Mounjaro to 7.5 mg. Prolonged in the evenings. Also reports worsening anxiety/insomnia, though unsure if completely related to acid reflux Previously tried: metformin XR, GI upset Current glucose readings: using DexCom G6 Date of Download: 05/06/21-05/19/21 % Time CGM is active: 100% Average Glucose: 148 mg/dL Glucose Management Indicator: 6.8  Glucose Variability: 28 (goal <36%) Time in Goal:  - Time in range 70-180: 87% - Time above range: 12% - Time below range: <1% Encouraged to schedule lab work at her convenience.  Offered to schedule PCP visit to discuss symptoms. She declines at this time. Offered to reduce Mounjaro back to 5 mg, she declines. We discussed that impact on gastric emptying with dose increase likely related to prolonged acid reflux into the evenings, and should improve with time. Consider 14 day PPI trial with GI referral if continued symptoms.  Mounjaro PA denied on her insurance, requires trial  and failure of all other GLP1. The manufacturer savings card for Darcel Bayley is still working for her at the pharmacy, so she elects to continue therapy at this time. Consider switch to Ozempic moving forward if manufacture coupon card stops working.   Hypertension: Controlled; current treatment: spironolactone 100 mg BID (PCOS), metoprolol succinate 50 mg daily;  Previously recommended to continue current regimen at this time  Hyperlipidemia: Unmanaged; current treatment: none;  Medications previously tried: none  Current dietary patterns: high in fats Given diabetes, ASCVD risk score >10%, recommend initiation of moderate intensity statin. Will discuss moving forward.   Depression/Anxiety/Agoraphobia: Moderately well managed; current treatment: citalopram 40 mg daily, clonazepam 0.25 mg BID PRN Worsening anxiety per her report, leading to insomnia. Discussed PCP visit as above. She declined.  Recommended to continue current regimen at this time  Asthma: Controlled; current regimen: Symbicort 80/4.5 mcg 2 puffs BID PRN albuterol HFA PRN, montelukast 10 mg daily  Previously recommended to continue current regimen at this time  Hypothyroidism: Uncontrolled on last check; current regimen: levothyroxine 175 mcg daily Reviewed need for follow up lab work today. She reports she will come in and have done when she is able   GERD: Controlled per patient report; current regimen: famotidine 20 mg BID, Maalox as needed Discussed as above. Discussed non pharmacologic options such as eating supper earlier, ensuring she avoids spicy or fatty foods. Consider 14 day trial of PPI and referral to GI if symptoms persist.  Recommended to continue current regimen at this time  Supplement: Vitamin C, biotin, B complex, Vitamin D, Vitamin B12, ferrous sulfate PRN   Patient Goals/Self-Care Activities Over the next 90 days, patient will:  - take medications as prescribed target a minimum of 150 minutes of  moderate intensity exercise weekly engage in dietary modifications by moderating carbohydrate intake      Plan: Telephone follow up appointment with care management team member scheduled for:  pending lab results  Catie Darnelle Maffucci, PharmD, [place], [place] Clinical Pharmacist [employer] at [employer] [phone]

## 2021-05-19 NOTE — Patient Instructions (Signed)
Madeline Strickland,   Keep up the fantastic work!  Come in for follow up labs when you are able. I will talk to Dr. Darrick Huntsman about your symptoms. Please schedule a video visit with her if you feel you need support. See some other non-medication related things below to help with the acid reflux.   Take care!  Madeline Strickland, PharmD    Gastroesophageal Reflux Disease, Adult Follow these instructions at home: Eating and drinking  Follow a diet as recommended by your health care provider. This may involve avoiding foods and drinks such as: Coffee and tea, with or without caffeine. Drinks that contain alcohol. Energy drinks and sports drinks. Carbonated drinks or sodas. Chocolate and cocoa. Peppermint and mint flavorings. Garlic and onions. Horseradish. Spicy and acidic foods, including peppers, chili powder, curry powder, vinegar, hot sauces, and barbecue sauce. Citrus fruit juices and citrus fruits, such as oranges, lemons, and limes. Tomato-based foods, such as red sauce, chili, salsa, and pizza with red sauce. Fried and fatty foods, such as donuts, french fries, potato chips, and high-fat dressings. High-fat meats, such as hot dogs and fatty cuts of red and white meats, such as rib eye steak, sausage, ham, and bacon. High-fat dairy items, such as whole milk, butter, and cream cheese. Eat small, frequent meals instead of large meals. Avoid drinking large amounts of liquid with your meals. Avoid eating meals during the 2-3 hours before bedtime. Avoid lying down right after you eat. Do not exercise right after you eat. Lifestyle  Do not use any products that contain nicotine or tobacco. These products include cigarettes, chewing tobacco, and vaping devices, such as e-cigarettes. If you need help quitting, ask your health care provider. Try to reduce your stress by using methods such as yoga or meditation. If you need help reducing stress, ask your health care provider. If you are overweight, reduce  your weight to an amount that is healthy for you. Ask your health care provider for guidance about a safe weight loss goal. General instructions Pay attention to any changes in your symptoms. Take over-the-counter and prescription medicines only as told by your health care provider. Do not take aspirin, ibuprofen, or other NSAIDs unless your health care provider told you to take these medicines. Wear loose-fitting clothing. Do not wear anything tight around your waist that causes pressure on your abdomen. Raise (elevate) the head of your bed about 6 inches (15 cm). You can use a wedge to do this. Avoid bending over if this makes your symptoms worse. Keep all follow-up visits. This is important. Contact a health care provider if: You have: New symptoms. Unexplained weight loss. Difficulty swallowing or it hurts to swallow. Wheezing or a persistent cough. A hoarse voice. Your symptoms do not improve with treatment. Get help right away if: You have sudden pain in your arms, neck, jaw, teeth, or back. You suddenly feel sweaty, dizzy, or light-headed. You have chest pain or shortness of breath. You vomit and the vomit is green, yellow, or black, or it looks like blood or coffee grounds. You faint. You have stool that is red, bloody, or black. You cannot swallow, drink, or eat. These symptoms may represent a serious problem that is an emergency. Do not wait to see if the symptoms will go away. Get medical help right away. Call your local emergency services (911 in the [place] Do not drive yourself to the hospital. Summary Gastroesophageal reflux happens when acid from the stomach flows up into the esophagus. GERD is  a disease in which the reflux happens often, causes frequent or severe symptoms, or causes problems such as damage to the esophagus. Treatment for this condition may vary depending on how severe your symptoms are. Your health care provider may recommend diet and lifestyle changes,  medicine, or surgery. Contact a health care provider if you have new or worsening symptoms. Take over-the-counter and prescription medicines only as told by your health care provider. Do not take aspirin, ibuprofen, or other NSAIDs unless your health care provider told you to do so. Keep all follow-up visits as told by your health care provider. This is important. This information is not intended to replace advice given to you by your health care provider. Make sure you discuss any questions you have with your health care provider. Document Revised: 10/19/2019 Document Reviewed: 10/19/2019 Elsevier Patient Education  2022 [employer].

## 2021-05-22 MED ORDER — PANTOPRAZOLE SODIUM 40 MG PO TBEC: 40.0000 mg | DELAYED_RELEASE_TABLET | Freq: Every day | ORAL | 3 refills | Status: DC

## 2021-05-22 NOTE — Telephone Encounter (Signed)
Agree , needs PPI  pantoprazole 40 mg daily in the am.  Change Famotidine to at night .  Rx sent

## 2021-05-22 NOTE — Addendum Note (Signed)
Addended by: Sherlene Shams on: 05/22/2021 05:19 PM   Modules accepted: Orders

## 2021-05-23 NOTE — Telephone Encounter (Addendum)
Called patient, reviewed as below. Advised to call us if acid reflux does not improve, or it improves but insomnia/anxiety is still worsened to call us to schedule a video visit with PCP. She verbalized understanding

## 2021-06-08 ENCOUNTER — Telehealth: Payer: Self-pay | Admitting: Pharmacist

## 2021-06-08 DIAGNOSIS — E119 Type 2 diabetes mellitus without complications: Secondary | ICD-10-CM

## 2021-06-08 MED ORDER — TIRZEPATIDE 5 MG/0.5ML ~~LOC~~ SOAJ: 5.0000 mg | SUBCUTANEOUS | 1 refills | Status: DC

## 2021-06-08 NOTE — Telephone Encounter (Signed)
Received automatic request for PA from [employer]. PA for Madeline Strickland previously denied on her insurance as she has not tried and failed all preferred alternatives.   Called pharmacy to see if they ran through previously activated Mounjaro savings card. They ran and are filling the 5 mg dose for her.

## 2021-06-08 NOTE — Telephone Encounter (Signed)
Patient reporting intolerance of Mounjaro 7.5 mg weekly. Reporting insomnia (though previously reported worsening anxiety) and lower BP 90/55s. Requested to reduce dose to 5 mg weekly. I have sent this dose. Encouraged to advise myself and PCP if these symptoms do not improve on lower dose.   Reiterated to hydrate well, if GI side effects are resulting in lower hydration and hypotension.   Previously encouraged to schedule follow up with PCP and complete ordered lab work.

## 2021-06-09 ENCOUNTER — Telehealth: Payer: BC Managed Care – PPO | Admitting: Physician Assistant

## 2021-06-09 DIAGNOSIS — J019 Acute sinusitis, unspecified: Secondary | ICD-10-CM

## 2021-06-09 DIAGNOSIS — B9789 Other viral agents as the cause of diseases classified elsewhere: Secondary | ICD-10-CM

## 2021-06-09 MED ORDER — IPRATROPIUM BROMIDE 0.03 % NA SOLN: 2.0000 | Freq: Two times a day (BID) | NASAL | 0 refills | Status: DC

## 2021-06-09 NOTE — Progress Notes (Signed)

## 2021-06-10 ENCOUNTER — Emergency Department
Admission: EM | Admit: 2021-06-10 | Discharge: 2021-06-10 | Disposition: A | Discharge status: Home / Self Care | Payer: BC Managed Care – PPO | Attending: Emergency Medicine | Admitting: Emergency Medicine

## 2021-06-10 ENCOUNTER — Other Ambulatory Visit: Payer: Self-pay

## 2021-06-10 DIAGNOSIS — Z79899 Other long term (current) drug therapy: Secondary | ICD-10-CM | POA: Insufficient documentation

## 2021-06-10 DIAGNOSIS — H81399 Other peripheral vertigo, unspecified ear: Secondary | ICD-10-CM

## 2021-06-10 DIAGNOSIS — J45909 Unspecified asthma, uncomplicated: Secondary | ICD-10-CM | POA: Diagnosis not present

## 2021-06-10 DIAGNOSIS — E039 Hypothyroidism, unspecified: Secondary | ICD-10-CM | POA: Insufficient documentation

## 2021-06-10 DIAGNOSIS — N390 Urinary tract infection, site not specified: Secondary | ICD-10-CM | POA: Diagnosis not present

## 2021-06-10 DIAGNOSIS — E119 Type 2 diabetes mellitus without complications: Secondary | ICD-10-CM | POA: Diagnosis not present

## 2021-06-10 DIAGNOSIS — D72829 Elevated white blood cell count, unspecified: Secondary | ICD-10-CM | POA: Diagnosis not present

## 2021-06-10 DIAGNOSIS — D649 Anemia, unspecified: Secondary | ICD-10-CM | POA: Diagnosis not present

## 2021-06-10 DIAGNOSIS — Z20822 Contact with and (suspected) exposure to covid-19: Secondary | ICD-10-CM | POA: Diagnosis not present

## 2021-06-10 DIAGNOSIS — E871 Hypo-osmolality and hyponatremia: Secondary | ICD-10-CM | POA: Insufficient documentation

## 2021-06-10 DIAGNOSIS — R42 Dizziness and giddiness: Secondary | ICD-10-CM | POA: Diagnosis present

## 2021-06-10 LAB — TROPONIN I (HIGH SENSITIVITY): Troponin I (High Sensitivity): 3 ng/L (ref <18)

## 2021-06-10 LAB — CBC WITH DIFFERENTIAL/PLATELET
Abs Immature Granulocytes: 0.06 10*3/uL (ref 0.00–0.07)
Basophils Absolute: 0 10*3/uL (ref 0.0–0.1)
Basophils Relative: 0 %
Eosinophils Absolute: 0.3 10*3/uL (ref 0.0–0.5)
Eosinophils Relative: 2 %
HCT: 27.9 % — ABNORMAL LOW (ref 36.0–46.0)
Hemoglobin: 8 g/dL — ABNORMAL LOW (ref 12.0–15.0)
Immature Granulocytes: 0 %
Lymphocytes Relative: 22 %
Lymphs Abs: 3.1 10*3/uL (ref 0.7–4.0)
MCH: 20.4 pg — ABNORMAL LOW (ref 26.0–34.0)
MCHC: 28.7 g/dL — ABNORMAL LOW (ref 30.0–36.0)
MCV: 71 fL — ABNORMAL LOW (ref 80.0–100.0)
Monocytes Absolute: 1.2 10*3/uL — ABNORMAL HIGH (ref 0.1–1.0)
Monocytes Relative: 8 %
Neutro Abs: 9.4 10*3/uL — ABNORMAL HIGH (ref 1.7–7.7)
Neutrophils Relative %: 68 %
Platelets: 471 10*3/uL — ABNORMAL HIGH (ref 150–400)
RBC: 3.93 MIL/uL (ref 3.87–5.11)
RDW: 17.9 % — ABNORMAL HIGH (ref 11.5–15.5)
WBC: 14 10*3/uL — ABNORMAL HIGH (ref 4.0–10.5)
nRBC: 0 % (ref 0.0–0.2)

## 2021-06-10 LAB — COMPREHENSIVE METABOLIC PANEL
ALT: 14 U/L (ref 0–44)
AST: 18 U/L (ref 15–41)
Albumin: 3.5 g/dL (ref 3.5–5.0)
Alkaline Phosphatase: 116 U/L (ref 38–126)
Anion gap: 5 (ref 5–15)
BUN: 9 mg/dL (ref 6–20)
CO2: 24 mmol/L (ref 22–32)
Calcium: 8.6 mg/dL — ABNORMAL LOW (ref 8.9–10.3)
Chloride: 103 mmol/L (ref 98–111)
Creatinine, Ser: 1.08 mg/dL — ABNORMAL HIGH (ref 0.44–1.00)
GFR, Estimated: >60 mL/min (ref >60)
Glucose, Bld: 115 mg/dL — ABNORMAL HIGH (ref 70–99)
Potassium: 3.6 mmol/L (ref 3.5–5.1)
Sodium: 132 mmol/L — ABNORMAL LOW (ref 135–145)
Total Bilirubin: 0.4 mg/dL (ref 0.3–1.2)
Total Protein: 7 g/dL (ref 6.5–8.1)

## 2021-06-10 LAB — RESP PANEL BY RT-PCR (FLU A&B, COVID) ARPGX2
Influenza A by PCR: NEGATIVE
Influenza B by PCR: NEGATIVE
SARS Coronavirus 2 by RT PCR: NEGATIVE

## 2021-06-10 LAB — URINALYSIS, ROUTINE W REFLEX MICROSCOPIC
Bilirubin Urine: NEGATIVE
Glucose, UA: NEGATIVE mg/dL
Hgb urine dipstick: NEGATIVE
Ketones, ur: 5 mg/dL — AB
Nitrite: POSITIVE — AB
Protein, ur: NEGATIVE mg/dL
Specific Gravity, Urine: 1.021 (ref 1.005–1.030)
pH: 5 (ref 5.0–8.0)

## 2021-06-10 MED ORDER — MECLIZINE HCL 25 MG PO TABS: 25.0000 mg | ORAL_TABLET | Freq: Three times a day (TID) | ORAL | 0 refills | Status: DC | PRN

## 2021-06-10 MED ORDER — SODIUM CHLORIDE 0.9 % IV BOLUS
1000.0000 mL | Freq: Once | INTRAVENOUS | Status: AC
  Administered 2021-06-10: 1000 mL via INTRAVENOUS

## 2021-06-10 MED ORDER — MECLIZINE HCL 25 MG PO TABS
25.0000 mg | ORAL_TABLET | Freq: Once | ORAL | Status: AC
  Administered 2021-06-10: 25 mg via ORAL
  Filled 2021-06-10: qty 1

## 2021-06-10 MED ORDER — CEPHALEXIN 500 MG PO CAPS: 500.0000 mg | ORAL_CAPSULE | Freq: Two times a day (BID) | ORAL | 0 refills | Status: AC

## 2021-06-10 MED ORDER — CEPHALEXIN 500 MG PO CAPS
500.0000 mg | ORAL_CAPSULE | Freq: Once | ORAL | Status: AC
  Administered 2021-06-10: 500 mg via ORAL
  Filled 2021-06-10: qty 1

## 2021-06-10 NOTE — Discharge Instructions (Addendum)
We suspect that your dizziness spells are due to peripheral vertigo.  Take the meclizine as needed.  Make sure to stay hydrated.  Take the antibiotic (cephalexin) as prescribed for the UTI and finish the full 7-day course.  Return to the ER for new, worsening, or persistent severe dizziness, lightheadedness, feeling like you are going to pass out, difficulty walking, changes in your vision or speech, or any other new or worsening symptoms that concern you.

## 2021-06-10 NOTE — ED Triage Notes (Signed)
First nurse note: pt comes ems from home with nausea for 2 days. States she feels like she is going to pass out. Pale, diaphoretic. VSS. Hx of hypothyroidism, T2D. CBG 120. 125/68. HR 82.

## 2021-06-10 NOTE — ED Triage Notes (Signed)
Pt states today she had a really bad dizzy spell- pt states that she was also diaphoretic- pt states that over the past week she has had low BP and low cbg- pt appears pale- pt has a dexcom and it is reading current blood sugar at 129

## 2021-06-10 NOTE — ED Provider Notes (Addendum)
[facility] Provider Note    Event Date/Time   First MD Initiated Contact with Patient 06/10/21 1958     (approximate)   History   Dizziness   HPI  Madeline Strickland is a 51 y.o. female with history of type 2 diabetes, obesity, hypothyroidism, and asthma who presents primarily with dizzy spells occurring intermittently over the last 3 to 4 days, acute onset when they occur, lasting for few minutes and then resolving.  The patient states it feels like everything is spinning and she needs to hold onto things to stabilize herself.  She gets nauseous and sweaty when this happens.  She states that a couple of times she has felt like she might pass out.  She denies associated headache.  The patient states that before these dizzy spells started she was having some URI symptoms and sinus congestion for a few days.  She denies any changes in her hearing.   Physical Exam   Triage Vital Signs: ED Triage Vitals  Enc Vitals Group     BP 06/10/21 1843 (!) 107/44     Pulse Rate 06/10/21 1843 60     Resp 06/10/21 1843 18     Temp 06/10/21 1846 98.2 F (36.8 C)     Temp Source 06/10/21 1846 Oral     SpO2 06/10/21 1843 100 %     Weight 06/10/21 1844 218 lb (98.9 kg)     Height 06/10/21 1844 5\' 9"  (1.753 m)     Head Circumference --      Peak Flow --      Pain Score 06/10/21 1843 0     Pain Loc --      Pain Edu? --      Excl. in [place]? --     Most recent vital signs: Vitals:   06/10/21 2130 06/10/21 2200  BP: (!) 108/54 114/61  Pulse: 61 60  Resp: 13 18  Temp:    SpO2: 98% 98%    General: Awake, no distress.  CV:  Good peripheral perfusion.  Resp:  Normal effort.  Abd:  No distention.  Other:  Normal coordination with no ataxia on finger-to-nose.  Motor and sensory intact in all extremities.  EOMI.  PERRLA.  No nystagmus.  Moist mucous membranes.   ED Results / Procedures / Treatments   Labs (all labs ordered are listed, but only abnormal results are  displayed) Labs Reviewed  URINALYSIS, ROUTINE W REFLEX MICROSCOPIC - Abnormal; Notable for the following components:      Result Value   Color, Urine AMBER (*)    APPearance HAZY (*)    Ketones, ur 5 (*)    Nitrite POSITIVE (*)    Leukocytes,Ua TRACE (*)    Bacteria, UA RARE (*)    All other components within normal limits  CBC WITH DIFFERENTIAL/PLATELET - Abnormal; Notable for the following components:   WBC 14.0 (*)    Hemoglobin 8.0 (*)    HCT 27.9 (*)    MCV 71.0 (*)    MCH 20.4 (*)    MCHC 28.7 (*)    RDW 17.9 (*)    Platelets 471 (*)    Neutro Abs 9.4 (*)    Monocytes Absolute 1.2 (*)    All other components within normal limits  COMPREHENSIVE METABOLIC PANEL - Abnormal; Notable for the following components:   Sodium 132 (*)    Glucose, Bld 115 (*)    Creatinine, Ser 1.08 (*)    Calcium  8.6 (*)    All other components within normal limits  RESP PANEL BY RT-PCR (FLU A&B, COVID) ARPGX2  TROPONIN I (HIGH SENSITIVITY)     EKG  ED ECG REPORT I, Arta Silence, the attending physician, personally viewed and interpreted this ECG.  Date: 06/10/2021 EKG Time: 1839 Rate: 65 Rhythm: normal sinus rhythm QRS Axis: normal Intervals: normal ST/T Wave abnormalities: normal Narrative Interpretation: no evidence of acute ischemia    RADIOLOGY   PROCEDURES:  Critical Care performed: No  Procedures   MEDICATIONS ORDERED IN ED: Medications  sodium chloride 0.9 % bolus 1,000 mL (1,000 mLs Intravenous New Bag/Given 06/10/21 2105)  cephALEXin (KEFLEX) capsule 500 mg (500 mg Oral Given 06/10/21 2235)  meclizine (ANTIVERT) tablet 25 mg (25 mg Oral Given 06/10/21 2235)     IMPRESSION / MDM / ASSESSMENT AND PLAN / ED COURSE  I reviewed the triage vital signs and the nursing notes.  51 year old female with PMH as noted above presents with recurrent episodes of dizziness over the last several days preceded by some URI symptoms and sinus pressure.  She is currently  asymptomatic.  On exam her vital signs are normal.  The physical exam is otherwise unremarkable.  Neurologic exam is normal.  Differential diagnosis includes, but is not limited to, peripheral vertigo, vasovagal near syncope, dehydration/hypovolemia, COVID-19, influenza, or other viral syndrome.  There is no evidence of cardiac etiology.  EKG is normal.  Given the patient's age and lack of risk factors I do not suspect central vertigo or other CNS etiology.  Lab work-up is significant for leukocytosis and borderline hyponatremia, as well as anemia.  The patient does not have a prior history of anemia but states that she has not been to a doctor for some time, and her last CBC in the chart is from 4 years ago.  It was normal.  Troponin is negative.  We will obtain a respiratory panel, give a fluid bolus, and reassess.  The patient does not require any acute treatment for vertigo at this time.  The patient is on the cardiac monitor to evaluate for evidence of arrhythmia and/or significant heart rate changes.  ----------------------------------------- 10:28 PM on 06/10/2021 -----------------------------------------  Respiratory panel is negative.  Urinalysis shows findings consistent with a UTI, which also could be a contributor the patient's symptoms.  We will give a dose of Keflex here and then prescribe the same for home.  There is no evidence of pyelonephritis or sepsis, or any indication for inpatient admission.  The patient reports that she had 1 very brief episode of dizziness after my initial evaluation.  It lasted for few seconds.  I will give a dose of meclizine here and prescribe the same for home.  I did discuss the patient's anemia with her.  The patient states that she has relatively heavy periods that have been somewhat irregular and frequent.  She denies any other abnormal bleeding or bruising.  She has no blood in the stool or any melena.  At this time there is no indication for  transfusion or other urgent intervention.  She will need further anemia work-up with her PMD.  At this time, the patient is stable for discharge.  Return precautions given, and she expresses understanding.   FINAL CLINICAL IMPRESSION(S) / ED DIAGNOSES   Final diagnoses:  Peripheral vertigo, unspecified laterality  Urinary tract infection without hematuria, site unspecified     Rx / DC Orders   ED Discharge Orders  Ordered    meclizine (ANTIVERT) 25 MG tablet  3 times daily PRN        06/10/21 2227    cephALEXin (KEFLEX) 500 MG capsule  2 times daily        06/10/21 2227             Note:  This document was prepared using Dragon voice recognition software and may include unintentional dictation errors.    Arta Silence, MD 06/10/21 2229    Arta Silence, MD 06/10/21 2245

## 2021-06-19 ENCOUNTER — Telehealth (INDEPENDENT_AMBULATORY_CARE_PROVIDER_SITE_OTHER): Payer: BC Managed Care – PPO | Admitting: Internal Medicine

## 2021-06-19 ENCOUNTER — Encounter: Payer: Self-pay | Admitting: Internal Medicine

## 2021-06-19 VITALS — BP 115/58 | HR 80 | Ht 69.0 in | Wt 216.0 lb

## 2021-06-19 DIAGNOSIS — D510 Vitamin B12 deficiency anemia due to intrinsic factor deficiency: Secondary | ICD-10-CM | POA: Diagnosis not present

## 2021-06-19 DIAGNOSIS — Z87891 Personal history of nicotine dependence: Secondary | ICD-10-CM

## 2021-06-19 DIAGNOSIS — E871 Hypo-osmolality and hyponatremia: Secondary | ICD-10-CM | POA: Diagnosis not present

## 2021-06-19 DIAGNOSIS — E669 Obesity, unspecified: Secondary | ICD-10-CM

## 2021-06-19 DIAGNOSIS — I10 Essential (primary) hypertension: Secondary | ICD-10-CM

## 2021-06-19 DIAGNOSIS — D649 Anemia, unspecified: Secondary | ICD-10-CM

## 2021-06-19 MED ORDER — METOPROLOL SUCCINATE ER 25 MG PO TB24: ORAL_TABLET | ORAL | 1 refills | Status: DC

## 2021-06-19 MED ORDER — LEVOTHYROXINE SODIUM 175 MCG PO TABS: ORAL_TABLET | ORAL | 0 refills | Status: DC

## 2021-06-19 MED ORDER — SPIRONOLACTONE 100 MG PO TABS: ORAL_TABLET | ORAL | 0 refills | Status: DC

## 2021-06-19 MED ORDER — MONTELUKAST SODIUM 10 MG PO TABS: ORAL_TABLET | ORAL | 1 refills | Status: DC

## 2021-06-19 MED ORDER — "SYRINGE 25G X 1"" 3 ML MISC": 0 refills | Status: DC

## 2021-06-19 NOTE — Assessment & Plan Note (Signed)
Has been taking oral supplements due to lack of syringes. Significant anemia noted during February ER visit. Advised strongly to schedule long overdue lab appt ; resume b12 IM and  Checking iron

## 2021-06-19 NOTE — Assessment & Plan Note (Signed)
Has not smoked since starting mounjaro 4 months ago

## 2021-06-19 NOTE — Progress Notes (Signed)
Virtual Visit via [place] Note  This visit type was conducted due to national recommendations for restrictions regarding the COVID-19 pandemic (e.g. social distancing).  This format is felt to be most appropriate for this patient at this time.  All issues noted in this document were discussed and addressed.  No physical exam was performed (except for noted visual exam findings with Video Visits).   I connected withNAME@ on 06/19/21 at  4:30 PM EST by a video enabled telemedicine application o and verified that I am speaking with the correct person using two identifiers. Location patient: home Location provider: work or home office Persons participating in the virtual visit: patient, provider  I discussed the limitations, risks, security and privacy concerns of performing an evaluation and management service by telephone and the availability of in person appointments. I also discussed with the patient that there may be a patient responsible charge related to this service. The patient expressed understanding and agreed to proceed.   Reason for visit: ER follow up  HPI:  51 yr old female with obesity, diabetes and hypertension, recently treated in ED for vertigo, UTI hyponatremia   HPI:  had an E visit on Feb 18 for sinusitis with symptoms of facial pain,right sided, and ear fullness hat had been present for over one week.  Requested abx but was given Atrovent only .  On Feb 19 bent over and became  became diaphoretic , nauseated  and dizzy with true vertigo symptoms.  Taken to ED by EMS .  States that she had not eaten in several days .  UA noted 6 WBCs,  no bacteria.  Was treated with keflex for UTI .  Sodium 132.   Hgb 8   Since then sinuses have become nontender but ears still plugged despite using sudafed.    Obesity:  was taking Mounjaro  but the 7.5 mg dose caused severe GERD despite daily use of both protonix and famotidine, so she stopped medication for 3 weeks ,  GERD resolved,   recently resumed 5 mg dose one week ago.  Has not gained weight. .  Lost 32 lbs thus far on the  7.5 mg dose   Type 2 DM: using Dexcom.  CBG'S running 80-90,  post prandials have been  excellent   HTN:  taking metoprolol 50 mg daily   readings  have been < 120  usually 250 systolic  Quit smoking with mounjaro. Lost her taste for cigs.       ROS: See pertinent positives and negatives per HPI.  Past Medical History:  Diagnosis Date   Asthma    Carotid stenosis 2008   Dr. Tami Ribas, found during follow up for headaches, left side   Chronic back pain greater than 3 months duration    secondary to [place] 2009   Hirsutism    negative workup by Dr. Nicolasa Ducking, [place], now on spironolactone   History of hearing loss    right ear   Hypertension    Obesity (BMI 30-39.9)    Obesity (BMI 30-39.9) 12/13/2010   Polycystic ovarian syndrome    S/P laparoscopic cholecystectomy 2009   Dr. Jamal Collin, for recurrent billary colic   Thyroid disease    Tobacco abuse     Past Surgical History:  Procedure Laterality Date   BREAST BIOPSY Left 06/18/2017   biopsy of 2 areas - Dr. Dwyane Luo office   CHOLECYSTECTOMY  2009   S[facility]ar    Family History  Problem Relation Age of Onset  Coronary artery disease Father    Heart disease Mother    Diabetes Mother    Coronary artery disease Mother    Breast cancer Neg Hx     SOCIAL HX:  reports that she has been smoking cigarettes. She has never used smokeless tobacco. She reports that she does not drink alcohol and does not use drugs.    Current Outpatient Medications:    Alum & Mag Hydroxide-Simeth (MAALOX ADVANCED PO), Take by mouth at bedtime., Disp: , Rfl:    Ascorbic Acid (VITAMIN C) 100 MG tablet, Take 100 mg by mouth daily., Disp: , Rfl:    b complex vitamins tablet, Take 1 tablet by mouth daily., Disp: , Rfl:    BIOTIN PO, Take by mouth., Disp: , Rfl:    Blood Glucose Monitoring Suppl (ONE TOUCH ULTRA SYSTEM KIT) W/DEVICE KIT, 1 kit by Does not  apply route once., Disp: 1 each, Rfl: 0   budesonide-formoterol (SYMBICORT) 80-4.5 MCG/ACT inhaler, 1-2 puffs BID PRN. May use 1-2 puffs Q4H PRN symptom flares, maximum dose 12 inhalations per day, Disp: 1 each, Rfl: 3   Cholecalciferol (VITAMIN D) 125 MCG (5000 UT) CAPS, Take 1 capsule by mouth. Twice weekly, Disp: , Rfl:    citalopram (CELEXA) 40 MG tablet, Take 40 mg by mouth every morning., Disp: , Rfl:    clonazePAM (KLONOPIN) 0.5 MG tablet, Take 0.25 mg by mouth 2 (two) times daily as needed., Disp: , Rfl:    COLLAGEN PO, Take by mouth., Disp: , Rfl:    Continuous Blood Gluc Receiver (DEXCOM G6 RECEIVER) DEVI, Use to check blood sugars before and after meals, Disp: 1 each, Rfl: 11   Continuous Blood Gluc Sensor (DEXCOM G6 SENSOR) MISC, Use to check blood sugars  before  and after each meal, Disp: 1 each, Rfl: 11   Continuous Blood Gluc Transmit (DEXCOM G6 TRANSMITTER) MISC, Use to check blood sugars up to 4 times daily., Disp: 1 each, Rfl: 3   famotidine (PEPCID) 20 MG tablet, TAKE 1 TABLET(20 MG) BY MOUTH TWICE DAILY, Disp: 180 tablet, Rfl: 0   glucose blood (ONETOUCH VERIO) test strip, Use to check blood sugars up to four times daily., Disp: 400 each, Rfl: 2   Insulin Pen Needle (B-D UF III MINI PEN NEEDLES) 31G X 5 MM MISC, USE AS DIRECTED THREE TIMES DAILY, Disp: 100 each, Rfl: 2   Lancets (ONETOUCH ULTRASOFT) lancets, Use as instructed, Disp: 100 each, Rfl: 12   levothyroxine (SYNTHROID) 175 MCG tablet, TAKE 1 TABLET(175 MCG) BY MOUTH DAILY, Disp: 90 tablet, Rfl: 0   meclizine (ANTIVERT) 25 MG tablet, Take 1 tablet (25 mg total) by mouth 3 (three) times daily as needed for dizziness., Disp: 15 tablet, Rfl: 0   metoprolol succinate (TOPROL-XL) 50 MG 24 hr tablet, TAKE 1 TABLET BY MOUTH DAILY WITH OR IMMEDIATELY FOLLOWING A MEAL, Disp: 90 tablet, Rfl: 1   montelukast (SINGULAIR) 10 MG tablet, TAKE 1 TABLET(10 MG) BY MOUTH AT BEDTIME, Disp: 90 tablet, Rfl: 1   mupirocin ointment (BACTROBAN)  2 %, Apply 1 application topically 2 (two) times daily., Disp: 22 g, Rfl: 0   nystatin (MYCOSTATIN/NYSTOP) powder, Apply topically 2 (two) times daily. on affected areas., Disp: 56.7 g, Rfl: 3   pantoprazole (PROTONIX) 40 MG tablet, Take 1 tablet (40 mg total) by mouth daily., Disp: 30 tablet, Rfl: 3   spironolactone (ALDACTONE) 100 MG tablet, TAKE 1 TABLET(100 MG) BY MOUTH TWICE DAILY, Disp: 180 tablet, Rfl: 0   Syringe/Needle,  Disp, (SYRINGE 3CC/25GX1") 25G X 1" 3 ML MISC, Use for b12 injections, Disp: 50 each, Rfl: 0   tirzepatide (MOUNJARO) 5 MG/0.5ML Pen, Inject 5 mg into the skin once a week., Disp: 6 mL, Rfl: 1   cyanocobalamin (,VITAMIN B-12,) 1000 MCG/ML injection, INJECT 1 ML IN THE MUSCLE DAILY FOR 3 DAYS, WEEKLY FOR 2 THEN MONTHLY THEREAFTER (Patient not taking: Reported on 01/13/2021), Disp: 10 mL, Rfl: 4   ferrous fumarate (HEMOCYTE - 106 MG FE) 325 (106 FE) MG TABS, Take 1 tablet by mouth. (Patient not taking: Reported on 06/19/2021), Disp: , Rfl:    traZODone (DESYREL) 50 MG tablet, Take 50 mg by mouth at bedtime as needed. (Patient not taking: Reported on 01/13/2021), Disp: , Rfl:   Current Facility-Administered Medications:    betamethasone acetate-betamethasone sodium phosphate (CELESTONE) injection 3 mg, 3 mg, Intramuscular, Once, Evans, Dorathy Daft, DPM  EXAM:  VITALS per patient if applicable:  GENERAL: alert, oriented, appears well and in no acute distress  HEENT: atraumatic, conjunttiva clear, no obvious abnormalities on inspection of external nose and ears  NECK: normal movements of the head and neck  LUNGS: on inspection no signs of respiratory distress, breathing rate appears normal, no obvious gross SOB, gasping or wheezing  CV: no obvious cyanosis  MS: moves all visible extremities without noticeable abnormality  PSYCH/NEURO: pleasant and cooperative, no obvious depression or anxiety, speech and thought processing grossly intact  ASSESSMENT AND PLAN:  Discussed  the following assessment and plan:  Anemia, unspecified type - Plan: Iron, TIBC and Ferritin Panel, IBC + Ferritin  History of tobacco abuse  Vitamin B12 deficiency anemia due to intrinsic factor deficiency  Hyponatremia  Obesity (BMI 30-39.9)  Primary hypertension  History of tobacco abuse Has not smoked since starting mounjaro 4 months ago   Vitamin B12 deficiency anemia due to intrinsic factor deficiency Has been taking oral supplements due to lack of syringes. Significant anemia noted during February ER visit. Advised strongly to schedule long overdue lab appt ; resume b12 IM and  Checking iron   Hyponatremia Noted during Feb 2023 ER visit;  In the setting of decreased PO intake for several days. Likely resolved with improved oral intake.  No symptoms   Obesity (BMI 30-39.9) She has had a 32 lb weight loss with mounjaro. Currently tolerating restart of 5 mg dose.  7.5 mg dose caused severe reflux despite PPI/H2 blocker therapy   Hypertension BP has been lower since losing weight. Taking metoprolol 50 ng and spironolactone.for management of fluid retetion.  Advised to reduce Metoprolol dose to 25 mg daily     I discussed the assessment and treatment plan with the patient. The patient was provided an opportunity to ask questions and all were answered. The patient agreed with the plan and demonstrated an understanding of the instructions.   The patient was advised to call back or seek an in-person evaluation if the symptoms worsen or if the condition fails to improve as anticipated.   I spent 30 minutes dedicated to the care of this patient on the date of this encounter to include pre-visit review of his medical history,  Face-to-face time with the patient , and post visit ordering of testing and therapeutics.    Crecencio Mc, MD

## 2021-06-19 NOTE — Assessment & Plan Note (Signed)
She has had a 32 lb weight loss with mounjaro. Currently tolerating restart of 5 mg dose.  7.5 mg dose caused severe reflux despite PPI/H2 blocker therapy

## 2021-06-19 NOTE — Assessment & Plan Note (Signed)
Noted during Feb 2023 ER visit;  In the setting of decreased PO intake for several days. Likely resolved with improved oral intake.  No symptoms

## 2021-06-19 NOTE — Assessment & Plan Note (Addendum)
BP has been lower since losing weight. Taking metoprolol 50 ng and spironolactone.for management of fluid retetion.  Advised to reduce Metoprolol dose to 25 mg daily

## 2021-06-21 ENCOUNTER — Encounter: Payer: Self-pay | Admitting: Internal Medicine

## 2021-06-22 ENCOUNTER — Telehealth: Payer: Self-pay

## 2021-06-22 ENCOUNTER — Encounter: Payer: Self-pay | Admitting: Internal Medicine

## 2021-06-22 ENCOUNTER — Other Ambulatory Visit: Payer: Self-pay | Admitting: Internal Medicine

## 2021-06-22 DIAGNOSIS — R42 Dizziness and giddiness: Secondary | ICD-10-CM | POA: Insufficient documentation

## 2021-06-22 MED ORDER — FLUCONAZOLE 150 MG PO TABS: 150.0000 mg | ORAL_TABLET | Freq: Every day | ORAL | 0 refills | Status: DC

## 2021-06-22 MED ORDER — PREDNISONE 10 MG PO TABS: ORAL_TABLET | ORAL | 0 refills | Status: DC

## 2021-06-22 NOTE — Addendum Note (Signed)
Addended by: Sherlene Shams on: 06/22/2021 04:36 PM ? ? Modules accepted: Orders ? ?

## 2021-06-22 NOTE — Telephone Encounter (Signed)
Called pt to let her know that the prednisone taper has been sent in. Pt stated that she has a yeast infection now from the Keflex she has been taking. Pt is wanting to know if she can get a rx for diflucan.  ?

## 2021-06-22 NOTE — Telephone Encounter (Signed)
I'm having dizziness this morning.  That reminded me of you mentioning a taper pack but I didn't see it in my list of prescriptions called in.   Do I still need this prescription?  ? ? ?I do not see mention of a Prednisone taper in the office note from yesterday.  ?

## 2021-06-22 NOTE — Telephone Encounter (Signed)
Yes, fluconazole sent  ?

## 2021-06-22 NOTE — Telephone Encounter (Signed)
Changed to telephone encounter.

## 2021-06-23 NOTE — Telephone Encounter (Signed)
Pt is aware that medication has been sent in.  

## 2021-06-26 ENCOUNTER — Ambulatory Visit: Payer: Self-pay | Admitting: Pharmacist

## 2021-06-26 NOTE — Chronic Care Management (AMB) (Signed)
?  Chronic Care Management  ? ?Note ? ?06/26/2021 ?Name: Madeline Strickland MRN: [id number] DOB: 1970/09/28 ? ? ? ?Closing pharmacy CCM case at this time. Will collaborate with Care Guide to outreach to schedule follow up with RN CM. Patient has clinic contact information for future questions or concerns.  ? ?[name], PharmD, [place], CPP ?Clinical Pharmacist ?Nature conservation officer at [employer] ?[phone] ? ?

## 2021-06-28 ENCOUNTER — Other Ambulatory Visit: Payer: Self-pay | Admitting: Internal Medicine

## 2021-06-28 ENCOUNTER — Other Ambulatory Visit (INDEPENDENT_AMBULATORY_CARE_PROVIDER_SITE_OTHER): Payer: BC Managed Care – PPO

## 2021-06-28 DIAGNOSIS — D649 Anemia, unspecified: Secondary | ICD-10-CM | POA: Diagnosis not present

## 2021-06-28 DIAGNOSIS — E034 Atrophy of thyroid (acquired): Secondary | ICD-10-CM

## 2021-06-28 DIAGNOSIS — E559 Vitamin D deficiency, unspecified: Secondary | ICD-10-CM | POA: Diagnosis not present

## 2021-06-28 DIAGNOSIS — E119 Type 2 diabetes mellitus without complications: Secondary | ICD-10-CM

## 2021-06-28 DIAGNOSIS — E538 Deficiency of other specified B group vitamins: Secondary | ICD-10-CM | POA: Diagnosis not present

## 2021-06-29 ENCOUNTER — Telehealth: Payer: Self-pay | Admitting: Internal Medicine

## 2021-06-29 ENCOUNTER — Encounter: Payer: Self-pay | Admitting: Internal Medicine

## 2021-06-29 ENCOUNTER — Other Ambulatory Visit: Payer: Self-pay | Admitting: Internal Medicine

## 2021-06-29 DIAGNOSIS — R42 Dizziness and giddiness: Secondary | ICD-10-CM

## 2021-06-29 LAB — CBC WITH DIFFERENTIAL/PLATELET
Basophils Absolute: 0.1 10*3/uL (ref 0.0–0.1)
Basophils Relative: 0.4 % (ref 0.0–3.0)
Eosinophils Absolute: 0.1 10*3/uL (ref 0.0–0.7)
Eosinophils Relative: 0.4 % (ref 0.0–5.0)
HCT: 29.2 % — ABNORMAL LOW (ref 36.0–46.0)
Hemoglobin: 8.9 g/dL — ABNORMAL LOW (ref 12.0–15.0)
Lymphocytes Relative: 19.4 % (ref 12.0–46.0)
Lymphs Abs: 4.2 10*3/uL — ABNORMAL HIGH (ref 0.7–4.0)
MCHC: 30.6 g/dL (ref 30.0–36.0)
MCV: 69.5 fl — ABNORMAL LOW (ref 78.0–100.0)
Monocytes Absolute: 1.8 10*3/uL — ABNORMAL HIGH (ref 0.1–1.0)
Monocytes Relative: 8.5 % (ref 3.0–12.0)
Neutro Abs: 15.6 10*3/uL — ABNORMAL HIGH (ref 1.4–7.7)
Neutrophils Relative %: 71.3 % (ref 43.0–77.0)
Platelets: 525 10*3/uL — ABNORMAL HIGH (ref 150.0–400.0)
RBC: 4.2 Mil/uL (ref 3.87–5.11)
RDW: 21.2 % — ABNORMAL HIGH (ref 11.5–15.5)
WBC: 21.8 10*3/uL (ref 4.0–10.5)

## 2021-06-29 LAB — VITAMIN D 25 HYDROXY (VIT D DEFICIENCY, FRACTURES): VITD: 77.15 ng/mL (ref 30.00–100.00)

## 2021-06-29 LAB — HEMOGLOBIN A1C: Hgb A1c MFr Bld: 6.6 % — ABNORMAL HIGH (ref 4.6–6.5)

## 2021-06-29 LAB — LIPID PANEL
Cholesterol: 150 mg/dL (ref 0–200)
HDL: 38.9 mg/dL — ABNORMAL LOW (ref >39.00)
LDL Cholesterol: 77 mg/dL (ref 0–99)
NonHDL: 110.93
Total CHOL/HDL Ratio: 4
Triglycerides: 170 mg/dL — ABNORMAL HIGH (ref 0.0–149.0)
VLDL: 34 mg/dL (ref 0.0–40.0)

## 2021-06-29 LAB — TSH: TSH: 0.25 u[IU]/mL — ABNORMAL LOW (ref 0.35–5.50)

## 2021-06-29 LAB — COMPREHENSIVE METABOLIC PANEL
ALT: 13 U/L (ref 0–35)
AST: 11 U/L (ref 0–37)
Albumin: 3.8 g/dL (ref 3.5–5.2)
Alkaline Phosphatase: 107 U/L (ref 39–117)
BUN: 16 mg/dL (ref 6–23)
CO2: 29 mEq/L (ref 19–32)
Calcium: 9 mg/dL (ref 8.4–10.5)
Chloride: 104 mEq/L (ref 96–112)
Creatinine, Ser: 1.16 mg/dL (ref 0.40–1.20)
GFR: 54.17 mL/min — ABNORMAL LOW (ref >60.00)
Glucose, Bld: 106 mg/dL — ABNORMAL HIGH (ref 70–99)
Potassium: 3.9 mEq/L (ref 3.5–5.1)
Sodium: 139 mEq/L (ref 135–145)
Total Bilirubin: 0.3 mg/dL (ref 0.2–1.2)
Total Protein: 6.6 g/dL (ref 6.0–8.3)

## 2021-06-29 LAB — IBC + FERRITIN
Ferritin: 10.4 ng/mL (ref 10.0–291.0)
Iron: 26 ug/dL — ABNORMAL LOW (ref 42–145)
Saturation Ratios: 5.7 % — ABNORMAL LOW (ref 20.0–50.0)
TIBC: 453.6 ug/dL — ABNORMAL HIGH (ref 250.0–450.0)
Transferrin: 324 mg/dL (ref 212.0–360.0)

## 2021-06-29 LAB — VITAMIN B12: Vitamin B-12: 606 pg/mL (ref 211–911)

## 2021-06-29 MED ORDER — AMOXICILLIN-POT CLAVULANATE 875-125 MG PO TABS: 1.0000 | ORAL_TABLET | Freq: Two times a day (BID) | ORAL | 0 refills | Status: DC

## 2021-06-29 MED ORDER — PREDNISONE 10 MG PO TABS: ORAL_TABLET | ORAL | 0 refills | Status: DC

## 2021-06-29 NOTE — Addendum Note (Signed)
Addended by: Crecencio Mc on: 06/29/2021 01:17 PM ? ? Modules accepted: Orders ? ?

## 2021-06-29 NOTE — Assessment & Plan Note (Signed)
Now with sinus and ear pain. Dizziness improved transiently with prednisone.  Will start abx and repeat prednisone taper  ?

## 2021-06-29 NOTE — Telephone Encounter (Signed)
CRITICAL VALUE STICKER ? ?CRITICAL VALUE: White Count 21.8 ? ?RECEIVER (on-site recipient of call):Irfan Veal  ? ?DATE & TIME NOTIFIED: 06/29/2021 10:32am ? ?MESSENGER (representative from lab):Karen ? ?MD NOTIFIED: Dr Darrick Huntsman ? ?TIME OF NOTIFICATION: 10:33am ? ?RESPONSE:   ?

## 2021-06-29 NOTE — Telephone Encounter (Signed)
Spoke with pt and advised her of the lab results below. Pt gave a verbal understanding.  ?

## 2021-07-14 ENCOUNTER — Other Ambulatory Visit: Payer: BC Managed Care – PPO

## 2021-08-09 ENCOUNTER — Other Ambulatory Visit: Payer: Self-pay | Admitting: Internal Medicine

## 2021-08-14 ENCOUNTER — Other Ambulatory Visit: Payer: Self-pay | Admitting: Internal Medicine

## 2021-09-22 ENCOUNTER — Other Ambulatory Visit: Payer: Self-pay | Admitting: Internal Medicine

## 2021-11-01 ENCOUNTER — Other Ambulatory Visit: Payer: Self-pay | Admitting: Internal Medicine

## 2021-11-07 ENCOUNTER — Encounter: Payer: Self-pay | Admitting: Internal Medicine

## 2021-11-07 ENCOUNTER — Telehealth (INDEPENDENT_AMBULATORY_CARE_PROVIDER_SITE_OTHER): Payer: BC Managed Care – PPO | Admitting: Internal Medicine

## 2021-11-07 DIAGNOSIS — K2100 Gastro-esophageal reflux disease with esophagitis, without bleeding: Secondary | ICD-10-CM | POA: Insufficient documentation

## 2021-11-07 DIAGNOSIS — E669 Obesity, unspecified: Secondary | ICD-10-CM | POA: Diagnosis not present

## 2021-11-07 MED ORDER — TIRZEPATIDE 10 MG/0.5ML ~~LOC~~ SOAJ: 10.0000 mg | SUBCUTANEOUS | 2 refills | Status: DC

## 2021-11-07 MED ORDER — PANTOPRAZOLE SODIUM 40 MG PO TBEC: 40.0000 mg | DELAYED_RELEASE_TABLET | Freq: Two times a day (BID) | ORAL | 2 refills | Status: DC

## 2021-11-07 NOTE — Progress Notes (Signed)
Virtual Visit via [place]   Note    This format is felt to be most appropriate for this patient at this time.  All issues noted in this document were discussed and addressed.  No physical exam was performed (except for noted visual exam findings with Video Visits).   I connected with Madeline Strickland on 11/07/21 at 11:30 AM EDT by a video enabled telemedicine application  and verified that I am speaking with the correct person using two identifiers. Location patient: home Location provider: work or home office Persons participating in the virtual visit: patient, provider  I discussed the limitations, risks, security and privacy concerns of performing an evaluation and management service by telephone and the availability of in person appointments. I also discussed with the patient that there may be a patient responsible charge related to this service. The patient expressed understanding and agreed to proceed.  Reason for visit: weight management issues   HPI:  51 yr old female with history of type 2 DM complicated by obesity and tobacco abuse presents for follow up on weight and diabetes management. .  Using [place],  currently tolerating the 7.5 mg weekly dose with improved but not controlled GERD symptoms despite taking pantoprazole before dinner and famotidine in the morning.  Sleeping on a wedge pillow,  still waking up with GERD symptoms.  Has gained several lbs over the last month due to craving of sweets.  Has changed  site of administration to deltoid/tricep area of arm .  Wants to change to Ozempic .  Not exercising.  Hasn't smoked in a year.    ROS: See pertinent positives and negatives per HPI.  Past Medical History:  Diagnosis Date   Asthma    Carotid stenosis 2008   Dr. Tami Ribas, found during follow up for headaches, left side   Chronic back pain greater than 3 months duration    secondary to [place] 2009   Hirsutism    negative workup by Dr. Nicolasa Ducking, [place], now on spironolactone    History of hearing loss    right ear   Hypertension    Obesity (BMI 30-39.9)    Obesity (BMI 30-39.9) 12/13/2010   Polycystic ovarian syndrome    S/P laparoscopic cholecystectomy 2009   Dr. Jamal Collin, for recurrent billary colic   Thyroid disease    Tobacco abuse     Past Surgical History:  Procedure Laterality Date   BREAST BIOPSY Left 06/18/2017   biopsy of 2 areas - Dr. Dwyane Luo office   CHOLECYSTECTOMY  2009   S[facility]ar    Family History  Problem Relation Age of Onset   Coronary artery disease Father    Heart disease Mother    Diabetes Mother    Coronary artery disease Mother    Breast cancer Neg Hx     SOCIAL HX:  reports that she quit smoking about 18 months ago. Her smoking use included cigarettes. She has never used smokeless tobacco. She reports that she does not drink alcohol and does not use drugs.    Current Outpatient Medications:    Alum & Mag Hydroxide-Simeth (MAALOX ADVANCED PO), Take by mouth at bedtime., Disp: , Rfl:    Ascorbic Acid (VITAMIN C) 100 MG tablet, Take 100 mg by mouth daily., Disp: , Rfl:    b complex vitamins tablet, Take 1 tablet by mouth daily., Disp: , Rfl:    BIOTIN PO, Take by mouth., Disp: , Rfl:    Blood Glucose Monitoring Suppl (ONE TOUCH ULTRA SYSTEM KIT)  W/DEVICE KIT, 1 kit by Does not apply route once., Disp: 1 each, Rfl: 0   budesonide-formoterol (SYMBICORT) 80-4.5 MCG/ACT inhaler, 1-2 puffs BID PRN. May use 1-2 puffs Q4H PRN symptom flares, maximum dose 12 inhalations per day, Disp: 1 each, Rfl: 3   Cholecalciferol (VITAMIN D) 125 MCG (5000 UT) CAPS, Take 1 capsule by mouth. Twice weekly, Disp: , Rfl:    citalopram (CELEXA) 40 MG tablet, Take 40 mg by mouth every morning., Disp: , Rfl:    clonazePAM (KLONOPIN) 0.5 MG tablet, Take 0.25 mg by mouth 2 (two) times daily as needed., Disp: , Rfl:    COLLAGEN PO, Take by mouth., Disp: , Rfl:    Continuous Blood Gluc Receiver (DEXCOM G6 RECEIVER) DEVI, Use to check blood sugars before and  after meals, Disp: 1 each, Rfl: 11   Continuous Blood Gluc Sensor (DEXCOM G6 SENSOR) MISC, Use to check blood sugars  before  and after each meal, Disp: 1 each, Rfl: 11   Continuous Blood Gluc Transmit (DEXCOM G6 TRANSMITTER) MISC, Use to check blood sugars up to 4 times daily., Disp: 1 each, Rfl: 3   famotidine (PEPCID) 20 MG tablet, TAKE 1 TABLET(20 MG) BY MOUTH TWICE DAILY, Disp: 180 tablet, Rfl: 0   Insulin Pen Needle (B-D UF III MINI PEN NEEDLES) 31G X 5 MM MISC, USE AS DIRECTED THREE TIMES DAILY, Disp: 100 each, Rfl: 2   levothyroxine (SYNTHROID) 175 MCG tablet, TAKE 1 TABLET(175 MCG) BY MOUTH DAILY, Disp: 90 tablet, Rfl: 0   metoprolol succinate (TOPROL-XL) 25 MG 24 hr tablet, Take with or immediately following a meal., Disp: 90 tablet, Rfl: 1   montelukast (SINGULAIR) 10 MG tablet, TAKE 1 TABLET(10 MG) BY MOUTH AT BEDTIME, Disp: 90 tablet, Rfl: 1   MOUNJARO 7.5 MG/0.5ML Pen, SMARTSIG:7.5 Milligram(s) Topical Once a Week, Disp: , Rfl:    mupirocin ointment (BACTROBAN) 2 %, Apply 1 application topically 2 (two) times daily., Disp: 22 g, Rfl: 0   nystatin (MYCOSTATIN/NYSTOP) powder, Apply topically 2 (two) times daily. on affected areas., Disp: 56.7 g, Rfl: 3   spironolactone (ALDACTONE) 100 MG tablet, TAKE 1 TABLET(100 MG) BY MOUTH TWICE DAILY, Disp: 180 tablet, Rfl: 0   [START ON 11/17/2021] tirzepatide (MOUNJARO) 10 MG/0.5ML Pen, Inject 10 mg into the skin once a week., Disp: 2 mL, Rfl: 2   traZODone (DESYREL) 50 MG tablet, Take 50 mg by mouth at bedtime., Disp: , Rfl:    cyanocobalamin (,VITAMIN B-12,) 1000 MCG/ML injection, INJECT 1 ML IN THE MUSCLE DAILY FOR 3 DAYS, WEEKLY FOR 2 THEN MONTHLY THEREAFTER (Patient not taking: Reported on 01/13/2021), Disp: 10 mL, Rfl: 4   ferrous fumarate (HEMOCYTE - 106 MG FE) 325 (106 FE) MG TABS, Take 1 tablet by mouth. (Patient not taking: Reported on 06/19/2021), Disp: , Rfl:    meclizine (ANTIVERT) 25 MG tablet, Take 1 tablet (25 mg total) by mouth 3  (three) times daily as needed for dizziness. (Patient not taking: Reported on 11/07/2021), Disp: 15 tablet, Rfl: 0   pantoprazole (PROTONIX) 40 MG tablet, Take 1 tablet (40 mg total) by mouth 2 (two) times daily., Disp: 60 tablet, Rfl: 2   Syringe/Needle, Disp, (SYRINGE 3CC/25GX1") 25G X 1" 3 ML MISC, Use for b12 injections (Patient not taking: Reported on 11/07/2021), Disp: 50 each, Rfl: 0  Current Facility-Administered Medications:    betamethasone acetate-betamethasone sodium phosphate (CELESTONE) injection 3 mg, 3 mg, Intramuscular, Once, Evans, Dorathy Daft, DPM  EXAM:  VITALS per patient if applicable:  GENERAL: alert, oriented, appears well and in no acute distress  HEENT: atraumatic, conjunttiva clear, no obvious abnormalities on inspection of external nose and ears  NECK: normal movements of the head and neck  LUNGS: on inspection no signs of respiratory distress, breathing rate appears normal, no obvious gross SOB, gasping or wheezing  CV: no obvious cyanosis  MS: moves all visible extremities without noticeable abnormality  PSYCH/NEURO: pleasant and cooperative, no obvious depression or anxiety, speech and thought processing grossly intact  ASSESSMENT AND PLAN:  Discussed the following assessment and plan:  Obesity (BMI 30-39.9)  Gastroesophageal reflux disease with esophagitis without hemorrhage  Obesity (BMI 30-39.9) Advised to continue with mounjaro 7.5 mg x 2 weeks,  Then increase to 10 mg weekly rather than switch to loemic .  encouraged to exercise.   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  Increase protonix to bid     I discussed the assessment and treatment plan with the patient. The patient was provided an opportunity to ask questions and all were answered. The patient agreed with the plan and demonstrated an understanding of the instructions.   The patient was advised to call back or seek an in-person evaluation if the symptoms  worsen or if the condition fails to improve as anticipated.   I spent 30 minutes dedicated to the care of this patient on the date of this encounter to include pre-visit review of her weights and medical history,  Face-to-face time with the patient , and post visit ordering of testing and therapeutics.    Crecencio Mc, MD

## 2021-11-07 NOTE — Assessment & Plan Note (Signed)
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  Increase protonix to bid

## 2021-11-07 NOTE — Assessment & Plan Note (Addendum)
Advised to continue with mounjaro 7.5 mg x 2 weeks,  Then increase to 10 mg weekly rather than switch to loemic .  encouraged to exercise.

## 2021-11-15 ENCOUNTER — Other Ambulatory Visit: Payer: Self-pay

## 2021-11-15 ENCOUNTER — Encounter: Payer: Self-pay | Admitting: Internal Medicine

## 2021-11-15 MED ORDER — DEXCOM G6 SENSOR MISC: 11 refills | Status: DC

## 2021-11-20 ENCOUNTER — Telehealth: Payer: BC Managed Care – PPO | Admitting: Physician Assistant

## 2021-11-20 DIAGNOSIS — N760 Acute vaginitis: Secondary | ICD-10-CM | POA: Diagnosis not present

## 2021-11-20 DIAGNOSIS — B379 Candidiasis, unspecified: Secondary | ICD-10-CM

## 2021-11-20 DIAGNOSIS — T3695XA Adverse effect of unspecified systemic antibiotic, initial encounter: Secondary | ICD-10-CM | POA: Diagnosis not present

## 2021-11-20 DIAGNOSIS — B9689 Other specified bacterial agents as the cause of diseases classified elsewhere: Secondary | ICD-10-CM

## 2021-11-20 MED ORDER — METRONIDAZOLE 500 MG PO TABS: 500.0000 mg | ORAL_TABLET | Freq: Two times a day (BID) | ORAL | 0 refills | Status: AC

## 2021-11-20 MED ORDER — FLUCONAZOLE 150 MG PO TABS: 150.0000 mg | ORAL_TABLET | ORAL | 0 refills | Status: DC | PRN

## 2021-11-20 NOTE — Progress Notes (Signed)
E-Visit for Vaginal Symptoms  We are sorry that you are not feeling well. Here is how we plan to help! Based on what you shared with me it looks like you: May have a vaginosis due to bacteria  Vaginosis is an inflammation of the vagina that can result in discharge, itching and pain. The cause is usually a change in the normal balance of vaginal bacteria or an infection. Vaginosis can also result from reduced estrogen levels after menopause.  The most common causes of vaginosis are:   Bacterial vaginosis which results from an overgrowth of one on several organisms that are normally present in your vagina.   Yeast infections which are caused by a naturally occurring fungus called candida.   Vaginal atrophy (atrophic vaginosis) which results from the thinning of the vagina from reduced estrogen levels after menopause.   Trichomoniasis which is caused by a parasite and is commonly transmitted by sexual intercourse.  Factors that increase your risk of developing vaginosis include: Medications, such as antibiotics and steroids Uncontrolled diabetes Use of hygiene products such as bubble bath, vaginal spray or vaginal deodorant Douching Wearing damp or tight-fitting clothing Using an intrauterine device (IUD) for birth control Hormonal changes, such as those associated with pregnancy, birth control pills or menopause Sexual activity Having a sexually transmitted infection  Your treatment plan is Metronidazole or Flagyl 500mg  twice a day for 7 days.  I have electronically sent this prescription into the pharmacy that you have chosen. I have also prescribed Diflucan for yeast infections.   Be sure to take all of the medication as directed. Stop taking any medication if you develop a rash, tongue swelling or shortness of breath. Mothers who are breast feeding should consider pumping and discarding their breast milk while on these antibiotics. However, there is no consensus that infant exposure at  these doses would be harmful.  Remember that medication creams can weaken latex condoms.   HOME CARE:  Good hygiene may prevent some types of vaginosis from recurring and may relieve some symptoms:  Avoid baths, hot tubs and whirlpool spas. Rinse soap from your outer genital area after a shower, and dry the area well to prevent irritation. Don't use scented or harsh soaps, such as those with deodorant or antibacterial action. Avoid irritants. These include scented tampons and pads. Wipe from front to back after using the toilet. Doing so avoids spreading fecal bacteria to your vagina.  Other things that may help prevent vaginosis include:  Don't douche. Your vagina doesn't require cleansing other than normal bathing. Repetitive douching disrupts the normal organisms that reside in the vagina and can actually increase your risk of vaginal infection. Douching won't clear up a vaginal infection. Use a latex condom. Both female and female latex condoms may help you avoid infections spread by sexual contact. Wear cotton underwear. Also wear pantyhose with a cotton crotch. If you feel comfortable without it, skip wearing underwear to bed. Yeast thrives in [place] Your symptoms should improve in the next day or two.  GET HELP RIGHT AWAY IF:  You have pain in your lower abdomen ( pelvic area or over your ovaries) You develop nausea or vomiting You develop a fever Your discharge changes or worsens You have persistent pain with intercourse You develop shortness of breath, a rapid pulse, or you faint.  These symptoms could be signs of problems or infections that need to be evaluated by a medical provider now.  MAKE SURE YOU   Understand these  instructions. Will watch your condition. Will get help right away if you are not doing well or get worse.  Thank you for choosing an e-visit.  Your e-visit answers were reviewed by a board certified advanced clinical practitioner to  complete your personal care plan. Depending upon the condition, your plan could have included both over the counter or prescription medications.  Please review your pharmacy choice. Make sure the pharmacy is open so you can pick up prescription now. If there is a problem, you may contact your provider through [employer] and have the prescription routed to another pharmacy.  Your safety is important to [place]. If you have drug allergies check your prescription carefully.   For the next 24 hours you can use MyChart to ask questions about today's visit, request a non-urgent call back, or ask for a work or school excuse. You will get an email in the next two days asking about your experience. I hope that your e-visit has been valuable and will speed your recovery.  I provided 5 minutes of non face-to-face time during this encounter for chart review and documentation.

## 2021-12-13 ENCOUNTER — Ambulatory Visit (INDEPENDENT_AMBULATORY_CARE_PROVIDER_SITE_OTHER): Payer: BC Managed Care – PPO | Admitting: Internal Medicine

## 2021-12-13 ENCOUNTER — Encounter: Payer: Self-pay | Admitting: Internal Medicine

## 2021-12-13 VITALS — BP 110/64 | HR 65 | Temp 98.6°F | Ht 69.0 in | Wt 226.2 lb

## 2021-12-13 DIAGNOSIS — N6321 Unspecified lump in the left breast, upper outer quadrant: Secondary | ICD-10-CM

## 2021-12-13 DIAGNOSIS — H6993 Unspecified Eustachian tube disorder, bilateral: Secondary | ICD-10-CM

## 2021-12-13 DIAGNOSIS — E538 Deficiency of other specified B group vitamins: Secondary | ICD-10-CM

## 2021-12-13 DIAGNOSIS — E034 Atrophy of thyroid (acquired): Secondary | ICD-10-CM

## 2021-12-13 DIAGNOSIS — E669 Obesity, unspecified: Secondary | ICD-10-CM

## 2021-12-13 DIAGNOSIS — E1169 Type 2 diabetes mellitus with other specified complication: Secondary | ICD-10-CM

## 2021-12-13 DIAGNOSIS — R600 Localized edema: Secondary | ICD-10-CM

## 2021-12-13 DIAGNOSIS — D510 Vitamin B12 deficiency anemia due to intrinsic factor deficiency: Secondary | ICD-10-CM

## 2021-12-13 DIAGNOSIS — E559 Vitamin D deficiency, unspecified: Secondary | ICD-10-CM

## 2021-12-13 DIAGNOSIS — E119 Type 2 diabetes mellitus without complications: Secondary | ICD-10-CM

## 2021-12-13 DIAGNOSIS — D649 Anemia, unspecified: Secondary | ICD-10-CM

## 2021-12-13 DIAGNOSIS — Z124 Encounter for screening for malignant neoplasm of cervix: Secondary | ICD-10-CM | POA: Diagnosis not present

## 2021-12-13 DIAGNOSIS — Z0001 Encounter for general adult medical examination with abnormal findings: Secondary | ICD-10-CM

## 2021-12-13 DIAGNOSIS — Z1231 Encounter for screening mammogram for malignant neoplasm of breast: Secondary | ICD-10-CM

## 2021-12-13 DIAGNOSIS — H6983 Other specified disorders of Eustachian tube, bilateral: Secondary | ICD-10-CM

## 2021-12-13 DIAGNOSIS — Z1211 Encounter for screening for malignant neoplasm of colon: Secondary | ICD-10-CM

## 2021-12-13 DIAGNOSIS — D509 Iron deficiency anemia, unspecified: Secondary | ICD-10-CM

## 2021-12-13 DIAGNOSIS — E282 Polycystic ovarian syndrome: Secondary | ICD-10-CM

## 2021-12-13 DIAGNOSIS — E785 Hyperlipidemia, unspecified: Secondary | ICD-10-CM

## 2021-12-13 MED ORDER — FERROUS FUMARATE 325 (106 FE) MG PO TABS
1.0000 | ORAL_TABLET | Freq: Every day | ORAL | 2 refills | Status: DC
Start: 2021-12-13 — End: 2022-08-26

## 2021-12-13 MED ORDER — DEXCOM G7 SENSOR MISC: 11 refills | Status: DC

## 2021-12-13 MED ORDER — FUROSEMIDE 20 MG PO TABS: 20.0000 mg | ORAL_TABLET | Freq: Every day | ORAL | 3 refills | Status: AC

## 2021-12-13 MED ORDER — SEMAGLUTIDE (1 MG/DOSE) 4 MG/3ML ~~LOC~~ SOPN: 1.0000 mg | PEN_INJECTOR | SUBCUTANEOUS | 2 refills | Status: DC

## 2021-12-13 MED ORDER — DEXCOM G7 RECEIVER DEVI: 11 refills | Status: DC

## 2021-12-13 NOTE — Assessment & Plan Note (Signed)
She has not been able to follow up with labs, and LDL is < 100,  so statin has not been prescribed  Lab Results  Component Value Date   CHOL 150 06/28/2021   HDL 38.90 (L) 06/28/2021   LDLCALC 77 06/28/2021   LDLDIRECT 131.0 10/24/2015   TRIG 170.0 (H) 06/28/2021   CHOLHDL 4 06/28/2021

## 2021-12-13 NOTE — Progress Notes (Unsigned)
The patient is here for preventive visit and follow up and  management of  chronic and acute problems.   The risk factors are reflected in the social history.   The roster of all physicians providing medical care to patient - is listed in the Snapshot section of the chart.   Activities of daily living:  The patient is 100% independent in all ADLs: dressing, toileting, feeding as well as independent mobility   Home safety : The patient has smoke detectors in the home. They wear seatbelts.  There are no unsecured firearms at home. There is no violence in the home.    There is no risks for hepatitis, STDs or HIV. There is no   history of blood transfusion. They have no travel history to infectious disease endemic areas of the world.   The patient has seen their dentist in the last six month. They have seen their eye doctor in the last year. The patinet  denies slight hearing difficulty with regard to whispered voices and some television programs.  They have deferred audiologic testing in the last year.  They do not  have excessive sun exposure. Discussed the need for sun protection: hats, long sleeves and use of sunscreen if there is significant sun exposure.    Diet: the importance of a healthy diet is discussed. Her diet is quite restrictive and often reactive to recurrent hypoglycemia .   The benefits of regular aerobic exercise were discussed. The patient ;s exercise is limited by her  agoraphobia .  She exercises at home .    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    The following portions of the patient's history were reviewed and updated as appropriate: allergies, current medications, past family history, past medical history,  past surgical history, past social history  and  problem list.   Visual acuity was not assessed per patient preference since the patient has regular follow up with an  ophthalmologist. Hearing and body mass index were assessed and reviewed.    During the course of the visit the patient was educated and counseled about appropriate screening and preventive services including : fall prevention , diabetes screening, nutrition counseling, colorectal cancer screening, and recommended immunizations.    Chief Complaint:  1)  DM with obesity   using CBG monitor. Sugars have been running "high" post prandially (per report,  140 to 163 ). Reports that her appetite is suppressed on mounjaro ,  dropped 32 lbs while on therapy . Was skipping breakfast and lunch  and having 3-4 lows per week treated with soft drinks . Averaging one meal daily between 5 and 6. Since losing  moujaro has gained water weight and a total of 8 ls   2) HTN:  has been having low Bp's  metoprolol reduction to 25 mg still having 90/50 some days , other days 129/65 HPI     Annual Exam    Additional comments: Physical       Last edited by Adair Laundry, [place] on 12/13/2021  2:52 PM.      3) constipation aggravated by concurrent use of iron,  mounjaro and prenatal vitamins   4)IDA:  restless legs bothering her;  has stopped  taking iron  5) vertigo,  recurrent, often preceded by headache , both ears feel moist but denies ear pressure and  ear pain    Review of Symptoms  Patient denies fevers, malaise, unintentional weight loss, skin rash, eye pain, sinus congestion and sinus pain, sore throat, dysphagia,  hemoptysis , cough, dyspnea, wheezing, chest pain, palpitations, orthopnea, edema, abdominal pain, nausea, melena, diarrhea, constipation, flank pain, dysuria, hematuria, urinary  Frequency, nocturia, numbness, tingling, seizures,  Focal weakness, Loss of consciousness,  Tremor, insomnia, depression, and suicidal ideation.    Physical Exam:  General appearance: alert,  cooperative and appears stated age Ears: normal TM's and external ear canals both ears Throat: lips, mucosa, and tongue normal; teeth and gums normal Neck: no adenopathy, no carotid bruit, supple, symmetrical, trachea midline and thyroid not enlarged, symmetric, no tenderness/mass/nodules Back: symmetric, no curvature. ROM normal. No CVA tenderness. Lungs: clear to auscultation bilaterally Breast: deferre per patient choice (done by GYN) Heart: regular rate and rhythm, S1, S2 normal, no murmur, click, rub or gallop Abdomen: soft, non-tender; bowel sounds normal; no masses,  no organomegaly Pulses: 2+ and symmetric Skin: Skin color, texture, turgor normal. No rashes or lesions Lymph nodes: Cervical, supraclavicular, and axillary nodes normal.   BP 110/64 (BP Location: Left Arm, Patient Position: Sitting, Cuff Size: Large)   Pulse 65   Temp 98.6 F (37 C) (Oral)   Ht 5' 9"  (1.753 m)   Wt 226 lb 3.2 oz (102.6 kg)   SpO2 99%   BMI 33.40 kg/m      Assessment and Plan:  Hyperlipidemia associated with type 2 diabetes mellitus She has not been able to follow up with labs, and LDL is < 100,  so statin has not been prescribed  Lab Results  Component Value Date   CHOL 150 06/28/2021   HDL 38.90 (L) 06/28/2021   LDLCALC 77 06/28/2021   LDLDIRECT 131.0 10/24/2015   TRIG 170.0 (H) 06/28/2021   CHOLHDL 4 06/28/2021     Eustachian tube dysfunction, bilateral Suggested by episodes of vertigo preceded by frontal headache.  flonase and afrin trial   Obesity (BMI 30-39.9) Switching to  ozempic due to lack of availability of mounjaro. . Start with 1 mg dose .  Once again I have counselled her  to start and exercise program that burns calories rather than restrict her diet to the point of taking an inadequate amount of nutrients.   Edema of both lower extremities Adding prn furosemide,  Not more than 3 times weekly.   Type 2 diabetes mellitus with obesity ([place]) Advised to resume  metformin  Given concurrent PCOS,.  Continue  GLP 1 agonist, .STATIN recommended given concurrent  FH of CAD   Lab Results  Component Value Date   HGBA1C 7.0 (H) 12/13/2021   Lab Results  Component Value Date   MICROALBUR 1.4 12/13/2021   MICROALBUR <0.7 06/03/2017   Lab Results  Component Value Date   CHOL 151 12/13/2021   HDL 35.40 (L) 12/13/2021   LDLCALC 100 (H) 12/13/2021   LDLDIRECT 109.0 12/13/2021   TRIG 78.0 12/13/2021   CHOLHDL 4 12/13/2021      Polycystic ovarian syndrome Continue metformin,  Spironolactone,  Pharmacotherapy  for weight control   Vitamin B12 deficiency anemia due to intrinsic factor deficiency Has been taking oral supplements   Lab Results  Component Value Date   VITAMINB12 336 12/13/2021     Iron deficiency anemia, unspecified With signficant anemia. Etiology unclear as workup has been hindered by her inability to leave her home for the last 2-3 years due to diagnosis of agoraphobia brought on by fear  of COVID.  Will refer for colonoscopy  Lab Results  Component Value Date   IRON 34 (L) 12/13/2021   TIBC 471.8 (H) 12/13/2021   FERRITIN 5.7 (L) 12/13/2021   Lab Results  Component Value Date   WBC 13.1 (H) 12/13/2021   HGB 8.7 Repeated and verified X2. (L) 12/13/2021   HCT 28.1 (L) 12/13/2021   MCV 77.1 (L) 12/13/2021   PLT 333.0 12/13/2021     Mass of upper outer quadrant of left breast Follow up mammogram has NOT been done due to patient's development of agorophobia during COVID pandemic. Mammogram ordered    Updated Medication List Outpatient Encounter Medications as of 12/13/2021  Medication Sig   Alum & Mag Hydroxide-Simeth (MAALOX ADVANCED PO) Take by mouth at bedtime.   Ascorbic Acid (VITAMIN C) 100 MG tablet Take 100 mg by mouth daily.   b complex vitamins tablet Take 1 tablet by mouth daily.   BIOTIN PO Take by mouth.   Blood Glucose Monitoring Suppl (ONE TOUCH ULTRA SYSTEM KIT) W/DEVICE KIT 1 kit by Does not apply  route once.   budesonide-formoterol (SYMBICORT) 80-4.5 MCG/ACT inhaler 1-2 puffs BID PRN. May use 1-2 puffs Q4H PRN symptom flares, maximum dose 12 inhalations per day   busPIRone (BUSPAR) 10 MG tablet Take 10 mg by mouth 2 (two) times daily.   Cholecalciferol (VITAMIN D) 125 MCG (5000 UT) CAPS Take 1 capsule by mouth. Twice weekly   citalopram (CELEXA) 40 MG tablet Take 40 mg by mouth every morning.   clonazePAM (KLONOPIN) 0.5 MG tablet Take 0.25 mg by mouth 2 (two) times daily as needed.   COLLAGEN PO Take by mouth.   Continuous Blood Gluc Receiver (DEXCOM G7 RECEIVER) DEVI Use to check blood sugars 4 times daily   Continuous Blood Gluc Sensor (DEXCOM G7 SENSOR) MISC Use to check blood sugars 4 times daily   Continuous Blood Gluc Transmit (DEXCOM G6 TRANSMITTER) MISC Use to check blood sugars up to 4 times daily.   cyanocobalamin (,VITAMIN B-12,) 1000 MCG/ML injection INJECT 1 ML IN THE MUSCLE DAILY FOR 3 DAYS, WEEKLY FOR 2 THEN MONTHLY THEREAFTER   famotidine (PEPCID) 20 MG tablet TAKE 1 TABLET(20 MG) BY MOUTH TWICE DAILY   furosemide (LASIX) 20 MG tablet Take 1 tablet (20 mg total) by mouth daily. For weight gain overnight of 2 lbs   Insulin Pen Needle (B-D UF III MINI PEN NEEDLES) 31G X 5 MM MISC USE AS DIRECTED THREE TIMES DAILY   levothyroxine (SYNTHROID) 175 MCG tablet TAKE 1 TABLET(175 MCG) BY MOUTH DAILY   meclizine (ANTIVERT) 25 MG tablet Take 1 tablet (25 mg total) by mouth 3 (three) times daily as needed for dizziness.   metoprolol succinate (TOPROL-XL) 25 MG 24 hr tablet Take with or immediately following a meal.   montelukast (SINGULAIR) 10 MG tablet TAKE 1 TABLET(10 MG) BY MOUTH AT BEDTIME   mupirocin ointment (BACTROBAN) 2 % Apply 1 application topically 2 (two) times daily.   nystatin (MYCOSTATIN/NYSTOP) powder Apply topically 2 (two) times daily. on affected areas.   pantoprazole (PROTONIX) 40 MG tablet Take 1 tablet (40 mg total) by mouth 2 (two) times daily.   Semaglutide, 1  MG/DOSE, 4 MG/3ML SOPN Inject 1 mg into the skin once a week.   spironolactone (ALDACTONE) 100 MG tablet TAKE 1 TABLET(100 MG) BY MOUTH TWICE DAILY   Syringe/Needle, Disp, (SYRINGE 3CC/25GX1") 25G X 1" 3 ML MISC Use for b12 injections   traZODone (DESYREL) 50 MG tablet Take 50  mg by mouth at bedtime.   [DISCONTINUED] Continuous Blood Gluc Sensor (DEXCOM G6 SENSOR) MISC Use to check blood sugars  before  and after each meal   [DISCONTINUED] ferrous fumarate (HEMOCYTE - 106 MG FE) 325 (106 FE) MG TABS Take 1 tablet by mouth.   [DISCONTINUED] tirzepatide (MOUNJARO) 10 MG/0.5ML Pen Inject 10 mg into the skin once a week.   ferrous fumarate (HEMOCYTE - 106 MG FE) 325 (106 Fe) MG TABS tablet Take 1 tablet (106 mg of iron total) by mouth daily.   [DISCONTINUED] Continuous Blood Gluc Receiver (DEXCOM G6 RECEIVER) DEVI Use to check blood sugars before and after meals   [DISCONTINUED] fluconazole (DIFLUCAN) 150 MG tablet Take 1 tablet (150 mg total) by mouth every 3 (three) days as needed. (Patient not taking: Reported on 12/13/2021)   [DISCONTINUED] MOUNJARO 7.5 MG/0.5ML Pen SMARTSIG:7.5 Milligram(s) Topical Once a Week (Patient not taking: Reported on 12/13/2021)   Facility-Administered Encounter Medications as of 12/13/2021  Medication   betamethasone acetate-betamethasone sodium phosphate (CELESTONE) injection 3 mg   A total of 40 minutes was spent on the day of this visit reviewing past office visits, imaging studies,  and in face to face time , as well as in ordering post visit diagnostics and therapeutics.

## 2021-12-13 NOTE — Patient Instructions (Signed)
1) Furosemide once daily if needed for weight gain of 2 lbs overnight (fluid retention).  Avoid daily use  2) for the ears:  Flonase   (sent to pharmacy) .  Use Afrin before the flonase twice daily for 5 days,  then contibue only flonase   3) for the constipation caused by iron and ozempic   Miralax or metmucil daily in 8 ounces of water :  drink 30 minutes prior to meal to create fullness  Add magnesium citrate 250 mg at night along with one or 2 colace (stool softeners)  Start ozempic at the 1 mg weekly dose.

## 2021-12-14 ENCOUNTER — Telehealth: Payer: Self-pay

## 2021-12-14 DIAGNOSIS — H6983 Other specified disorders of Eustachian tube, bilateral: Secondary | ICD-10-CM | POA: Insufficient documentation

## 2021-12-14 DIAGNOSIS — R600 Localized edema: Secondary | ICD-10-CM | POA: Insufficient documentation

## 2021-12-14 DIAGNOSIS — Z0001 Encounter for general adult medical examination with abnormal findings: Secondary | ICD-10-CM | POA: Insufficient documentation

## 2021-12-14 DIAGNOSIS — H6993 Unspecified Eustachian tube disorder, bilateral: Secondary | ICD-10-CM | POA: Insufficient documentation

## 2021-12-14 DIAGNOSIS — D509 Iron deficiency anemia, unspecified: Secondary | ICD-10-CM | POA: Insufficient documentation

## 2021-12-14 LAB — LIPID PANEL
Cholesterol: 151 mg/dL (ref 0–200)
HDL: 35.4 mg/dL — ABNORMAL LOW (ref >39.00)
LDL Cholesterol: 100 mg/dL — ABNORMAL HIGH (ref 0–99)
NonHDL: 115.7
Total CHOL/HDL Ratio: 4
Triglycerides: 78 mg/dL (ref 0.0–149.0)
VLDL: 15.6 mg/dL (ref 0.0–40.0)

## 2021-12-14 LAB — COMPREHENSIVE METABOLIC PANEL
ALT: 14 U/L (ref 0–35)
AST: 16 U/L (ref 0–37)
Albumin: 3.8 g/dL (ref 3.5–5.2)
Alkaline Phosphatase: 91 U/L (ref 39–117)
BUN: 9 mg/dL (ref 6–23)
CO2: 26 mEq/L (ref 19–32)
Calcium: 8.7 mg/dL (ref 8.4–10.5)
Chloride: 101 mEq/L (ref 96–112)
Creatinine, Ser: 1.11 mg/dL (ref 0.40–1.20)
GFR: 56.93 mL/min — ABNORMAL LOW (ref >60.00)
Glucose, Bld: 121 mg/dL — ABNORMAL HIGH (ref 70–99)
Potassium: 4 mEq/L (ref 3.5–5.1)
Sodium: 136 mEq/L (ref 135–145)
Total Bilirubin: 0.4 mg/dL (ref 0.2–1.2)
Total Protein: 6.7 g/dL (ref 6.0–8.3)

## 2021-12-14 LAB — CBC WITH DIFFERENTIAL/PLATELET
Basophils Absolute: 0.1 10*3/uL (ref 0.0–0.1)
Basophils Relative: 0.6 % (ref 0.0–3.0)
Eosinophils Absolute: 0.2 10*3/uL (ref 0.0–0.7)
Eosinophils Relative: 1.3 % (ref 0.0–5.0)
HCT: 28.1 % — ABNORMAL LOW (ref 36.0–46.0)
Hemoglobin: 8.7 g/dL — ABNORMAL LOW (ref 12.0–15.0)
Lymphocytes Relative: 13.3 % (ref 12.0–46.0)
Lymphs Abs: 1.8 10*3/uL (ref 0.7–4.0)
MCHC: 30.8 g/dL (ref 30.0–36.0)
MCV: 77.1 fl — ABNORMAL LOW (ref 78.0–100.0)
Monocytes Absolute: 1.1 10*3/uL — ABNORMAL HIGH (ref 0.1–1.0)
Monocytes Relative: 8.3 % (ref 3.0–12.0)
Neutro Abs: 10.1 10*3/uL — ABNORMAL HIGH (ref 1.4–7.7)
Neutrophils Relative %: 76.5 % (ref 43.0–77.0)
Platelets: 333 10*3/uL (ref 150.0–400.0)
RBC: 3.65 Mil/uL — ABNORMAL LOW (ref 3.87–5.11)
RDW: 17.1 % — ABNORMAL HIGH (ref 11.5–15.5)
WBC: 13.1 10*3/uL — ABNORMAL HIGH (ref 4.0–10.5)

## 2021-12-14 LAB — IBC + FERRITIN
Ferritin: 5.7 ng/mL — ABNORMAL LOW (ref 10.0–291.0)
Iron: 34 ug/dL — ABNORMAL LOW (ref 42–145)
Saturation Ratios: 7.2 % — ABNORMAL LOW (ref 20.0–50.0)
TIBC: 471.8 ug/dL — ABNORMAL HIGH (ref 250.0–450.0)
Transferrin: 337 mg/dL (ref 212.0–360.0)

## 2021-12-14 LAB — LDL CHOLESTEROL, DIRECT: Direct LDL: 109 mg/dL

## 2021-12-14 LAB — MICROALBUMIN / CREATININE URINE RATIO
Creatinine,U: 135 mg/dL
Microalb Creat Ratio: 1 mg/g (ref 0.0–30.0)
Microalb, Ur: 1.4 mg/dL (ref 0.0–1.9)

## 2021-12-14 LAB — HEMOGLOBIN A1C: Hgb A1c MFr Bld: 7 % — ABNORMAL HIGH (ref 4.6–6.5)

## 2021-12-14 LAB — TSH: TSH: 0.27 u[IU]/mL — ABNORMAL LOW (ref 0.35–5.50)

## 2021-12-14 LAB — VITAMIN B12: Vitamin B-12: 336 pg/mL (ref 211–911)

## 2021-12-14 MED ORDER — LEVOTHYROXINE SODIUM 150 MCG PO TABS: 150.0000 ug | ORAL_TABLET | Freq: Every day | ORAL | 1 refills | Status: DC

## 2021-12-14 MED ORDER — ATORVASTATIN CALCIUM 20 MG PO TABS: 20.0000 mg | ORAL_TABLET | Freq: Every day | ORAL | 3 refills | Status: DC

## 2021-12-14 MED ORDER — METFORMIN HCL ER 500 MG PO TB24: 500.0000 mg | ORAL_TABLET | Freq: Every day | ORAL | 1 refills | Status: DC

## 2021-12-14 NOTE — Assessment & Plan Note (Signed)
Follow up mammogram has NOT been done due to patient's development of agorophobia during COVID pandemic. Mammogram ordered

## 2021-12-14 NOTE — Telephone Encounter (Signed)
PA submitted on covermymeds for DexCom 7 receiver and sensor.

## 2021-12-14 NOTE — Assessment & Plan Note (Addendum)
Advised to resume metformin  Given concurrent PCOS,.  Continue  GLP 1 agonist, .STATIN recommended given concurrent  FH of CAD   Lab Results  Component Value Date   HGBA1C 7.0 (H) 12/13/2021   Lab Results  Component Value Date   MICROALBUR 1.4 12/13/2021   MICROALBUR <0.7 06/03/2017   Lab Results  Component Value Date   CHOL 151 12/13/2021   HDL 35.40 (L) 12/13/2021   LDLCALC 100 (H) 12/13/2021   LDLDIRECT 109.0 12/13/2021   TRIG 78.0 12/13/2021   CHOLHDL 4 12/13/2021

## 2021-12-14 NOTE — Assessment & Plan Note (Signed)
Switching to  ozempic due to lack of availability of mounjaro. . Start with 1 mg dose .  Once again I have counselled her  to start and exercise program that burns calories rather than restrict her diet to the point of taking an inadequate amount of nutrients.

## 2021-12-14 NOTE — Assessment & Plan Note (Signed)
Suggested by episodes of vertigo preceded by frontal headache.  flonase and afrin trial

## 2021-12-14 NOTE — Assessment & Plan Note (Addendum)
With signficant anemia. Etiology unclear as workup has been hindered by her inability to leave her home for the last 2-3 years due to diagnosis of agoraphobia brought on by fear of COVID.  Will refer for colonoscopy  Lab Results  Component Value Date   IRON 34 (L) 12/13/2021   TIBC 471.8 (H) 12/13/2021   FERRITIN 5.7 (L) 12/13/2021   Lab Results  Component Value Date   WBC 13.1 (H) 12/13/2021   HGB 8.7 Repeated and verified X2. (L) 12/13/2021   HCT 28.1 (L) 12/13/2021   MCV 77.1 (L) 12/13/2021   PLT 333.0 12/13/2021

## 2021-12-14 NOTE — Assessment & Plan Note (Signed)
Has been taking oral supplements   Lab Results  Component Value Date   VITAMINB12 336 12/13/2021

## 2021-12-14 NOTE — Assessment & Plan Note (Signed)
Adding prn furosemide,  Not more than 3 times weekly.

## 2021-12-14 NOTE — Assessment & Plan Note (Signed)
Continue metformin,  Spironolactone,  Pharmacotherapy  for weight control

## 2021-12-14 NOTE — Assessment & Plan Note (Signed)

## 2021-12-18 ENCOUNTER — Encounter: Payer: Self-pay | Admitting: Internal Medicine

## 2021-12-19 ENCOUNTER — Other Ambulatory Visit: Payer: Self-pay | Admitting: Internal Medicine

## 2021-12-20 NOTE — Telephone Encounter (Signed)
Pt called stating she left a mychart message about her medication refill needing a PA. Pt stated no one has responded

## 2021-12-26 ENCOUNTER — Other Ambulatory Visit: Payer: Self-pay

## 2021-12-26 DIAGNOSIS — J45909 Unspecified asthma, uncomplicated: Secondary | ICD-10-CM

## 2021-12-26 LAB — HM DIABETES EYE EXAM

## 2021-12-26 MED ORDER — BUDESONIDE-FORMOTEROL FUMARATE 80-4.5 MCG/ACT IN AERO: INHALATION_SPRAY | RESPIRATORY_TRACT | 3 refills | Status: DC

## 2021-12-26 MED ORDER — ONETOUCH ULTRA VI STRP: ORAL_STRIP | 12 refills | Status: DC

## 2021-12-26 NOTE — Telephone Encounter (Signed)
I spoke with patient & let her know that multiple attempts have been made tog et the [facility] G7 covered for her. Unfortunately, while she doe stake the Ozempic she does not take actual insulin & that is denial reason. I have sent in her one touch ultra testing strips as she requested & let her know that I would try to contact insurance company to appeal.

## 2021-12-26 NOTE — Telephone Encounter (Signed)
Pt is calling about PA for medication

## 2022-01-08 ENCOUNTER — Encounter: Payer: Self-pay | Admitting: Internal Medicine

## 2022-01-08 ENCOUNTER — Telehealth (INDEPENDENT_AMBULATORY_CARE_PROVIDER_SITE_OTHER): Payer: BC Managed Care – PPO | Admitting: Internal Medicine

## 2022-01-08 VITALS — BP 105/70 | Ht 69.0 in | Wt 222.0 lb

## 2022-01-08 DIAGNOSIS — E282 Polycystic ovarian syndrome: Secondary | ICD-10-CM | POA: Diagnosis not present

## 2022-01-08 DIAGNOSIS — E785 Hyperlipidemia, unspecified: Secondary | ICD-10-CM

## 2022-01-08 DIAGNOSIS — E1169 Type 2 diabetes mellitus with other specified complication: Secondary | ICD-10-CM | POA: Diagnosis not present

## 2022-01-08 DIAGNOSIS — E034 Atrophy of thyroid (acquired): Secondary | ICD-10-CM

## 2022-01-08 DIAGNOSIS — D509 Iron deficiency anemia, unspecified: Secondary | ICD-10-CM | POA: Diagnosis not present

## 2022-01-08 DIAGNOSIS — E669 Obesity, unspecified: Secondary | ICD-10-CM

## 2022-01-08 NOTE — Assessment & Plan Note (Signed)
Thyroid function is overactive on 175 mcg  dose.  She has been taking the  lower dose of levothyroxine sent  to patient's  pharmacy in late august and will need a repeat TSH in early October

## 2022-01-08 NOTE — Assessment & Plan Note (Addendum)
She has been taking and tolerating atorvastatin since August 25.  She has been asked to have LFTs done this week .

## 2022-01-08 NOTE — Assessment & Plan Note (Signed)
Continue GLP 1 agonist.  Stopping metformin due to GI intolerance.  Continue use of DEXCOM CBG monitor given recurrent nocturnal hypoglycemia, .

## 2022-01-08 NOTE — Assessment & Plan Note (Signed)
She did not tolerate metformin trial.  She has significant IDA due to menorrhagia.  Advised to follow up with Dr. Marcelline Mates asap

## 2022-01-08 NOTE — Progress Notes (Signed)
Virtual Visit via [facility]   Note    This format is felt to be most appropriate for this patient at this time.  All issues noted in this document were discussed and addressed.  No physical exam was performed (except for noted visual exam findings with Video Visits).   I connected with  [name]  on 01/08/22 at 11:30 AM EDT by a video enabled telemedicine application  and verified that I am speaking with the correct person using two identifiers. Location patient: home Location provider: work or home office Persons participating in the virtual visit: patient, provider  I discussed the limitations, risks, security and privacy concerns of performing an evaluation and management service by telephone and the availability of in person appointments. I also discussed with the patient that there may be a patient responsible charge related to this service. The patient expressed understanding and agreed to proceed.   Reason for visit: follow up on type 2 DM and obesity  HPI: [name] is a 51 yr old female with Type 2 DM , non insulin dependent who presents for follow up on multiple issues  1) Type 2 DM:  currently taking ozempic 1 mg weekly and metformin XR 500 mg.  She has been having recurrent nocturnal hypoglycemic episodes which her DEXCOM monitor has detected and the alarm has woken her up. ..  She is having persistent explosive diarrhea since starting the metformin  occurring about 1 hour after eating. She has been unable to obtain [facility] monitor but has submitted documentation of recurrent hypoglycemic events .  She has been taking atorvastatin since August 25   2) IDA:  secondary to menometrorrhagia .  Periods last 15-20 days when they occur  has not seen Dr Marcelline Mates since the epidemic began  he started taking iron supplement 3 weeks ago. . .    3) HM:   colon cancer screening referral made   ROS: See pertinent positives and negatives per HPI.  Past Medical History:  Diagnosis Date   Asthma     Carotid stenosis 2008   Dr. Tami Ribas, found during follow up for headaches, left side   Chronic back pain greater than 3 months duration    secondary to [place] 2009   Hirsutism    negative workup by Dr. Nicolasa Ducking, [place], now on spironolactone   History of hearing loss    right ear   Hypertension    Obesity (BMI 30-39.9)    Obesity (BMI 30-39.9) 12/13/2010   Polycystic ovarian syndrome    S/P laparoscopic cholecystectomy 2009   Dr. Jamal Collin, for recurrent billary colic   Thyroid disease    Tobacco abuse     Past Surgical History:  Procedure Laterality Date   BREAST BIOPSY Left 06/18/2017   biopsy of 2 areas - Dr. Dwyane Luo office   CHOLECYSTECTOMY  2009   S[facility]ar    Family History  Problem Relation Age of Onset   Coronary artery disease Father    Heart disease Mother    Diabetes Mother    Coronary artery disease Mother    Breast cancer Neg Hx     SOCIAL HX:  reports that she quit smoking about 20 months ago. Her smoking use included cigarettes. She has never used smokeless tobacco. She reports that she does not drink alcohol and does not use drugs.    Current Outpatient Medications:    Alum & Mag Hydroxide-Simeth (MAALOX ADVANCED PO), Take by mouth at bedtime., Disp: , Rfl:    Ascorbic Acid (  VITAMIN C) 100 MG tablet, Take 100 mg by mouth daily., Disp: , Rfl:    atorvastatin (LIPITOR) 20 MG tablet, Take 1 tablet (20 mg total) by mouth daily., Disp: 90 tablet, Rfl: 3   b complex vitamins tablet, Take 1 tablet by mouth daily., Disp: , Rfl:    BIOTIN PO, Take by mouth., Disp: , Rfl:    Blood Glucose Monitoring Suppl (ONE TOUCH ULTRA SYSTEM KIT) W/DEVICE KIT, 1 kit by Does not apply route once., Disp: 1 each, Rfl: 0   budesonide-formoterol (SYMBICORT) 80-4.5 MCG/ACT inhaler, 1-2 puffs BID PRN. May use 1-2 puffs Q4H PRN symptom flares, maximum dose 12 inhalations per day, Disp: 1 each, Rfl: 3   busPIRone (BUSPAR) 10 MG tablet, Take 10 mg by mouth 2 (two) times daily., Disp: , Rfl:     Cholecalciferol (VITAMIN D) 125 MCG (5000 UT) CAPS, Take 1 capsule by mouth. Twice weekly, Disp: , Rfl:    citalopram (CELEXA) 40 MG tablet, Take 40 mg by mouth every morning., Disp: , Rfl:    clonazePAM (KLONOPIN) 0.5 MG tablet, Take 0.25 mg by mouth 2 (two) times daily as needed., Disp: , Rfl:    COLLAGEN PO, Take by mouth., Disp: , Rfl:    Continuous Blood Gluc Receiver ([place]) DEVI, Use to check blood sugars 4 times daily, Disp: 1 each, Rfl: 11   Continuous Blood Gluc Sensor (DEXCOM G7 SENSOR) MISC, Use to check blood sugars 4 times daily, Disp: 1 each, Rfl: 11   Continuous Blood Gluc Transmit (DEXCOM G6 TRANSMITTER) MISC, Use to check blood sugars up to 4 times daily., Disp: 1 each, Rfl: 3   cyanocobalamin (,VITAMIN B-12,) 1000 MCG/ML injection, INJECT 1 ML IN THE MUSCLE DAILY FOR 3 DAYS, WEEKLY FOR 2 THEN MONTHLY THEREAFTER, Disp: 10 mL, Rfl: 4   famotidine (PEPCID) 20 MG tablet, TAKE 1 TABLET(20 MG) BY MOUTH TWICE DAILY, Disp: 180 tablet, Rfl: 0   ferrous fumarate (HEMOCYTE - 106 MG FE) 325 (106 Fe) MG TABS tablet, Take 1 tablet (106 mg of iron total) by mouth daily., Disp: 30 tablet, Rfl: 2   furosemide (LASIX) 20 MG tablet, Take 1 tablet (20 mg total) by mouth daily. For weight gain overnight of 2 lbs, Disp: 30 tablet, Rfl: 3   glucose blood (ONETOUCH ULTRA) test strip, Used to check blood sugars twice daily., Disp: 100 each, Rfl: 12   Insulin Pen Needle (B-D UF III MINI PEN NEEDLES) 31G X 5 MM MISC, USE AS DIRECTED THREE TIMES DAILY, Disp: 100 each, Rfl: 2   levothyroxine (SYNTHROID) 150 MCG tablet, Take 1 tablet (150 mcg total) by mouth daily before breakfast., Disp: 90 tablet, Rfl: 1   meclizine (ANTIVERT) 25 MG tablet, Take 1 tablet (25 mg total) by mouth 3 (three) times daily as needed for dizziness., Disp: 15 tablet, Rfl: 0   metoprolol succinate (TOPROL-XL) 25 MG 24 hr tablet, TAKE 1 TABLET BY MOUTH WITH OR IMMEDIATELY FOLLOWING A MEAL, Disp: 90 tablet, Rfl: 1    montelukast (SINGULAIR) 10 MG tablet, TAKE 1 TABLET(10 MG) BY MOUTH AT BEDTIME, Disp: 90 tablet, Rfl: 1   mupirocin ointment (BACTROBAN) 2 %, Apply 1 application topically 2 (two) times daily., Disp: 22 g, Rfl: 0   nystatin (MYCOSTATIN/NYSTOP) powder, Apply topically 2 (two) times daily. on affected areas., Disp: 56.7 g, Rfl: 3   pantoprazole (PROTONIX) 40 MG tablet, Take 1 tablet (40 mg total) by mouth 2 (two) times daily., Disp: 60 tablet, Rfl: 2  Semaglutide, 1 MG/DOSE, 4 MG/3ML SOPN, Inject 1 mg into the skin once a week., Disp: 3 mL, Rfl: 2   spironolactone (ALDACTONE) 100 MG tablet, TAKE 1 TABLET(100 MG) BY MOUTH TWICE DAILY, Disp: 180 tablet, Rfl: 0   Syringe/Needle, Disp, (SYRINGE 3CC/25GX1") 25G X 1" 3 ML MISC, Use for b12 injections, Disp: 50 each, Rfl: 0   traZODone (DESYREL) 50 MG tablet, Take 50 mg by mouth at bedtime., Disp: , Rfl:   Current Facility-Administered Medications:    betamethasone acetate-betamethasone sodium phosphate (CELESTONE) injection 3 mg, 3 mg, Intramuscular, Once, Evans, Dorathy Daft, DPM  EXAM:  VITALS per patient if applicable:  GENERAL: alert, oriented, appears well and in no acute distress  HEENT: atraumatic, conjunttiva clear, no obvious abnormalities on inspection of external nose and ears  NECK: normal movements of the head and neck  LUNGS: on inspection no signs of respiratory distress, breathing rate appears normal, no obvious gross SOB, gasping or wheezing  CV: no obvious cyanosis  MS: moves all visible extremities without noticeable abnormality  PSYCH/NEURO: pleasant and cooperative, no obvious depression or anxiety, speech and thought processing grossly intact  ASSESSMENT AND PLAN:  Discussed the following assessment and plan:  Hyperlipidemia associated with type 2 diabetes mellitus ([facility]) - Plan: Comprehensive metabolic panel  Type 2 diabetes mellitus with obesity ([place])  Polycystic ovarian syndrome  Iron deficiency anemia, unspecified  iron deficiency anemia type  Hypothyroidism due to acquired atrophy of thyroid  Hyperlipidemia associated with type 2 diabetes mellitus She has been taking and tolerating atorvastatin since August 25.  She has been asked to have LFTs done this week .    Type 2 diabetes mellitus with obesity (HCC) Continue GLP 1 agonist.  Stopping metformin due to GI intolerance.  Continue use of DEXCOM CBG monitor given recurrent nocturnal hypoglycemia, .  Polycystic ovarian syndrome She did not tolerate metformin trial.  She has significant IDA due to menorrhagia.  Advised to follow up with Dr. Marcelline Mates asap  Iron deficiency anemia, unspecified Secondary to menorrhagia .  Colon ca screening also needed and in process .  Continue iron supplementation, recheck iron and CBC in early October ( 6 weeks)  Hypothyroidism due to acquired atrophy of thyroid Thyroid function is overactive on 175 mcg  dose.  She has been taking the  lower dose of levothyroxine sent  to patient's  pharmacy in late august and will need a repeat TSH in early October     I discussed the assessment and treatment plan with the patient. The patient was provided an opportunity to ask questions and all were answered. The patient agreed with the plan and demonstrated an understanding of the instructions.   The patient was advised to call back or seek an in-person evaluation if the symptoms worsen or if the condition fails to improve as anticipated.   I spent 30 minutes dedicated to the care of this patient on the date of this encounter to include pre-visit review of her medical history,  recent labs,  Face-to-face time with the patient , and post visit ordering of testing and therapeutics.    Crecencio Mc, MD

## 2022-01-08 NOTE — Assessment & Plan Note (Signed)
Secondary to menorrhagia .  Colon ca screening also needed and in process .  Continue iron supplementation, recheck iron and CBC in early October ( 6 weeks)

## 2022-01-12 ENCOUNTER — Other Ambulatory Visit: Payer: Self-pay | Admitting: Internal Medicine

## 2022-01-12 MED ORDER — SPIRONOLACTONE 100 MG PO TABS: ORAL_TABLET | ORAL | 0 refills | Status: DC

## 2022-01-15 ENCOUNTER — Other Ambulatory Visit: Payer: Self-pay

## 2022-01-15 DIAGNOSIS — E1169 Type 2 diabetes mellitus with other specified complication: Secondary | ICD-10-CM

## 2022-01-15 MED ORDER — FREESTYLE LIBRE 3 SENSOR MISC: 3 refills | Status: DC

## 2022-02-10 ENCOUNTER — Other Ambulatory Visit: Payer: Self-pay | Admitting: Internal Medicine

## 2022-02-11 ENCOUNTER — Telehealth: Payer: BC Managed Care – PPO | Admitting: Nurse Practitioner

## 2022-02-11 DIAGNOSIS — B9689 Other specified bacterial agents as the cause of diseases classified elsewhere: Secondary | ICD-10-CM | POA: Diagnosis not present

## 2022-02-11 DIAGNOSIS — J329 Chronic sinusitis, unspecified: Secondary | ICD-10-CM

## 2022-02-11 MED ORDER — AMOXICILLIN-POT CLAVULANATE 875-125 MG PO TABS: 1.0000 | ORAL_TABLET | Freq: Two times a day (BID) | ORAL | 0 refills | Status: AC

## 2022-02-11 MED ORDER — MECLIZINE HCL 25 MG PO TABS: 25.0000 mg | ORAL_TABLET | Freq: Three times a day (TID) | ORAL | 0 refills | Status: DC | PRN

## 2022-02-11 NOTE — Progress Notes (Signed)

## 2022-02-11 NOTE — Progress Notes (Signed)
I have spent 5 minutes in review of e-visit questionnaire, review and updating patient chart, medical decision making and response to patient.  ° °Reino Lybbert W Melvina Pangelinan, NP ° °  °

## 2022-02-28 ENCOUNTER — Other Ambulatory Visit: Payer: Self-pay

## 2022-02-28 MED ORDER — MONTELUKAST SODIUM 10 MG PO TABS: 10.0000 mg | ORAL_TABLET | Freq: Every day | ORAL | 1 refills | Status: DC

## 2022-04-03 NOTE — Telephone Encounter (Signed)
MyChart messgae sent to patient. 

## 2022-04-03 NOTE — Telephone Encounter (Signed)
Error

## 2022-04-04 NOTE — Telephone Encounter (Signed)
MyChart messgae sent to patient. 

## 2022-04-12 ENCOUNTER — Other Ambulatory Visit: Payer: Self-pay | Admitting: Internal Medicine

## 2022-05-09 ENCOUNTER — Other Ambulatory Visit: Payer: Self-pay | Admitting: Internal Medicine

## 2022-05-10 ENCOUNTER — Other Ambulatory Visit: Payer: Self-pay | Admitting: Internal Medicine

## 2022-05-12 ENCOUNTER — Encounter: Payer: Self-pay | Admitting: Internal Medicine

## 2022-05-14 ENCOUNTER — Other Ambulatory Visit: Payer: Self-pay

## 2022-05-14 DIAGNOSIS — E1169 Type 2 diabetes mellitus with other specified complication: Secondary | ICD-10-CM

## 2022-05-14 MED ORDER — FREESTYLE LIBRE 3 SENSOR MISC: 3 refills | Status: DC

## 2022-05-14 NOTE — Telephone Encounter (Signed)
PA Ozempic ?   

## 2022-05-16 ENCOUNTER — Other Ambulatory Visit (HOSPITAL_COMMUNITY): Payer: Self-pay

## 2022-05-16 NOTE — Telephone Encounter (Signed)
As far as I can tell per test claim, PA is not needed, please note that in test claim, Ozempic card is automatically added, however without card, test claim shows a copay of 30 dollars. No PA submitted at this time.

## 2022-05-22 ENCOUNTER — Other Ambulatory Visit: Payer: Self-pay

## 2022-05-22 MED ORDER — OZEMPIC (1 MG/DOSE) 4 MG/3ML ~~LOC~~ SOPN: PEN_INJECTOR | SUBCUTANEOUS | 2 refills | Status: DC

## 2022-06-08 ENCOUNTER — Inpatient Hospital Stay: Admission: RE | Admit: 2022-06-08 | Payer: BC Managed Care – PPO | Source: Ambulatory Visit

## 2022-06-15 ENCOUNTER — Ambulatory Visit: Payer: BC Managed Care – PPO | Admitting: Internal Medicine

## 2022-06-20 ENCOUNTER — Encounter: Payer: Self-pay | Admitting: Internal Medicine

## 2022-06-20 DIAGNOSIS — F411 Generalized anxiety disorder: Secondary | ICD-10-CM

## 2022-06-20 DIAGNOSIS — F4001 Agoraphobia with panic disorder: Secondary | ICD-10-CM

## 2022-07-09 ENCOUNTER — Other Ambulatory Visit: Payer: Self-pay | Admitting: Internal Medicine

## 2022-07-10 ENCOUNTER — Ambulatory Visit: Payer: BC Managed Care – PPO | Admitting: Internal Medicine

## 2022-07-24 ENCOUNTER — Other Ambulatory Visit: Payer: Self-pay

## 2022-07-24 MED ORDER — SPIRONOLACTONE 100 MG PO TABS: ORAL_TABLET | ORAL | 0 refills | Status: DC
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
## 2022-07-31 ENCOUNTER — Encounter: Payer: BC Managed Care – PPO | Admitting: Obstetrics and Gynecology

## 2022-08-23 ENCOUNTER — Encounter: Payer: Self-pay | Admitting: Internal Medicine

## 2022-08-23 ENCOUNTER — Telehealth (INDEPENDENT_AMBULATORY_CARE_PROVIDER_SITE_OTHER): Payer: BC Managed Care – PPO | Admitting: Internal Medicine

## 2022-08-23 VITALS — BP 141/75 | HR 69 | Ht 69.0 in | Wt 233.0 lb

## 2022-08-23 DIAGNOSIS — E669 Obesity, unspecified: Secondary | ICD-10-CM

## 2022-08-23 DIAGNOSIS — E1169 Type 2 diabetes mellitus with other specified complication: Secondary | ICD-10-CM

## 2022-08-23 DIAGNOSIS — F4001 Agoraphobia with panic disorder: Secondary | ICD-10-CM

## 2022-08-23 DIAGNOSIS — E034 Atrophy of thyroid (acquired): Secondary | ICD-10-CM | POA: Diagnosis not present

## 2022-08-23 DIAGNOSIS — I10 Essential (primary) hypertension: Secondary | ICD-10-CM | POA: Diagnosis not present

## 2022-08-23 DIAGNOSIS — Z7985 Long-term (current) use of injectable non-insulin antidiabetic drugs: Secondary | ICD-10-CM

## 2022-08-23 DIAGNOSIS — E785 Hyperlipidemia, unspecified: Secondary | ICD-10-CM

## 2022-08-23 DIAGNOSIS — D509 Iron deficiency anemia, unspecified: Secondary | ICD-10-CM | POA: Diagnosis not present

## 2022-08-23 MED ORDER — SPIRONOLACTONE 100 MG PO TABS: ORAL_TABLET | ORAL | 0 refills | Status: DC

## 2022-08-23 MED ORDER — MONTELUKAST SODIUM 10 MG PO TABS: 10.0000 mg | ORAL_TABLET | Freq: Every day | ORAL | 1 refills | Status: DC

## 2022-08-23 MED ORDER — METOPROLOL SUCCINATE ER 25 MG PO TB24: ORAL_TABLET | ORAL | 1 refills | Status: DC
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
## 2022-08-23 NOTE — Patient Instructions (Addendum)
Continue manipulating your dose of ozempic to 0.5 mg but increase frequency to every 5 days   Send me your home A1c when you get it!

## 2022-08-26 DIAGNOSIS — Z7985 Long-term (current) use of injectable non-insulin antidiabetic drugs: Secondary | ICD-10-CM | POA: Insufficient documentation

## 2022-08-26 NOTE — Assessment & Plan Note (Signed)
Continue use of ozempic  and strongly urged to start an aerobic exercise she is overdue for labs because she has been unable to leave her home ue to worsening symptoms of agorophobia  Lab Results  Component Value Date   HGBA1C 7.0 (H) 12/13/2021

## 2022-08-26 NOTE — Assessment & Plan Note (Signed)
Developed during COVID pandemic,  With continued fear restricting her movement ,  access to health care, and now her employment.  She is unable to go to work one day per month.  She is no longer seeing Dr Maryruth Bun and is now seeing a psychiagrisg at [facility] who has recommended anafranil.  I have encouraged her to start the medication

## 2022-08-26 NOTE — Assessment & Plan Note (Signed)
Switching to  ozempic due to lack of availability of mounjaro. . Start with 1 mg dose .  Once again I have counselled her  to start and exercise program that burns calories rather than restrict her diet to the point of taking an inadequate amount of nutrients.  

## 2022-08-27 ENCOUNTER — Other Ambulatory Visit: Payer: Self-pay | Admitting: Internal Medicine

## 2022-09-07 ENCOUNTER — Other Ambulatory Visit (INDEPENDENT_AMBULATORY_CARE_PROVIDER_SITE_OTHER): Payer: BC Managed Care – PPO

## 2022-09-07 DIAGNOSIS — E034 Atrophy of thyroid (acquired): Secondary | ICD-10-CM

## 2022-09-07 DIAGNOSIS — E669 Obesity, unspecified: Secondary | ICD-10-CM

## 2022-09-07 DIAGNOSIS — D509 Iron deficiency anemia, unspecified: Secondary | ICD-10-CM

## 2022-09-07 DIAGNOSIS — I10 Essential (primary) hypertension: Secondary | ICD-10-CM | POA: Diagnosis not present

## 2022-09-07 DIAGNOSIS — E1169 Type 2 diabetes mellitus with other specified complication: Secondary | ICD-10-CM

## 2022-09-07 NOTE — Addendum Note (Signed)
Addended by: Warden Fillers on: 09/07/2022 03:57 PM   Modules accepted: Orders

## 2022-09-08 LAB — CBC WITH DIFFERENTIAL/PLATELET
Basophils Absolute: 0 10*3/uL (ref 0.0–0.2)
Basos: 0 %
EOS (ABSOLUTE): 0.2 10*3/uL (ref 0.0–0.4)
Eos: 2 %
Hematocrit: 29 % — ABNORMAL LOW (ref 34.0–46.6)
Hemoglobin: 9 g/dL — ABNORMAL LOW (ref 11.1–15.9)
Immature Grans (Abs): 0.1 10*3/uL (ref 0.0–0.1)
Immature Granulocytes: 1 %
Lymphocytes Absolute: 1.8 10*3/uL (ref 0.7–3.1)
Lymphs: 16 %
MCH: 23.4 pg — ABNORMAL LOW (ref 26.6–33.0)
MCHC: 31 g/dL — ABNORMAL LOW (ref 31.5–35.7)
MCV: 75 fL — ABNORMAL LOW (ref 79–97)
Monocytes Absolute: 0.9 10*3/uL (ref 0.1–0.9)
Monocytes: 8 %
Neutrophils Absolute: 8.8 10*3/uL — ABNORMAL HIGH (ref 1.4–7.0)
Neutrophils: 73 %
Platelets: 361 10*3/uL (ref 150–450)
RBC: 3.85 x10E6/uL (ref 3.77–5.28)
RDW: 17 % — ABNORMAL HIGH (ref 11.7–15.4)
WBC: 11.8 10*3/uL — ABNORMAL HIGH (ref 3.4–10.8)

## 2022-09-08 LAB — COMPREHENSIVE METABOLIC PANEL
ALT: 12 IU/L (ref 0–32)
AST: 11 IU/L (ref 0–40)
Albumin/Globulin Ratio: 1.6 (ref 1.2–2.2)
Albumin: 3.9 g/dL (ref 3.8–4.9)
Alkaline Phosphatase: 133 IU/L — ABNORMAL HIGH (ref 44–121)
BUN/Creatinine Ratio: 8 — ABNORMAL LOW (ref 9–23)
BUN: 9 mg/dL (ref 6–24)
Bilirubin Total: <0.2 mg/dL (ref 0.0–1.2)
CO2: 22 mmol/L (ref 20–29)
Calcium: 9.1 mg/dL (ref 8.7–10.2)
Chloride: 106 mmol/L (ref 96–106)
Creatinine, Ser: 1.14 mg/dL — ABNORMAL HIGH (ref 0.57–1.00)
Globulin, Total: 2.5 g/dL (ref 1.5–4.5)
Glucose: 165 mg/dL — ABNORMAL HIGH (ref 70–99)
Potassium: 4.4 mmol/L (ref 3.5–5.2)
Sodium: 140 mmol/L (ref 134–144)
Total Protein: 6.4 g/dL (ref 6.0–8.5)
eGFR: 58 mL/min/{1.73_m2} — ABNORMAL LOW (ref >59)

## 2022-09-08 LAB — LIPID PANEL
Chol/HDL Ratio: 4.3 ratio (ref 0.0–4.4)
Cholesterol, Total: 145 mg/dL (ref 100–199)
HDL: 34 mg/dL — ABNORMAL LOW (ref >39)
LDL Chol Calc (NIH): 92 mg/dL (ref 0–99)
Triglycerides: 103 mg/dL (ref 0–149)
VLDL Cholesterol Cal: 19 mg/dL (ref 5–40)

## 2022-09-08 LAB — HEMOGLOBIN A1C
Est. average glucose Bld gHb Est-mCnc: 174 mg/dL
Hgb A1c MFr Bld: 7.7 % — ABNORMAL HIGH (ref 4.8–5.6)

## 2022-09-08 LAB — MICROALBUMIN / CREATININE URINE RATIO
Creatinine, Urine: 214.6 mg/dL
Microalb/Creat Ratio: 2 mg/g creat (ref 0–29)
Microalbumin, Urine: 4.3 ug/mL

## 2022-09-08 LAB — LDL CHOLESTEROL, DIRECT: LDL Direct: 95 mg/dL (ref 0–99)

## 2022-09-08 LAB — TSH: TSH: 1.53 u[IU]/mL (ref 0.450–4.500)

## 2022-09-18 ENCOUNTER — Other Ambulatory Visit: Payer: Self-pay | Admitting: Internal Medicine

## 2022-09-18 DIAGNOSIS — E1169 Type 2 diabetes mellitus with other specified complication: Secondary | ICD-10-CM

## 2022-09-24 ENCOUNTER — Other Ambulatory Visit: Payer: Self-pay | Admitting: Internal Medicine

## 2022-09-27 ENCOUNTER — Telehealth: Payer: BC Managed Care – PPO | Admitting: Physician Assistant

## 2022-09-27 DIAGNOSIS — H60331 Swimmer's ear, right ear: Secondary | ICD-10-CM | POA: Diagnosis not present

## 2022-09-27 MED ORDER — NEOMYCIN-POLYMYXIN-HC 3.5-10000-1 OT SOLN
4.0000 [drp] | Freq: Four times a day (QID) | OTIC | 0 refills | Status: AC
Start: 2022-09-27 — End: 2022-10-04

## 2022-09-27 MED ORDER — MECLIZINE HCL 25 MG PO TABS: 25.0000 mg | ORAL_TABLET | Freq: Three times a day (TID) | ORAL | 0 refills | Status: AC | PRN

## 2022-09-27 NOTE — Progress Notes (Signed)
I have spent 5 minutes in review of e-visit questionnaire, review and updating patient chart, medical decision making and response to patient.   Jolonda Gomm Cody Chord Takahashi, PA-C    

## 2022-09-27 NOTE — Addendum Note (Signed)
Addended by: Waldon Merl on: 09/27/2022 05:14 PM   Modules accepted: Orders

## 2022-09-27 NOTE — Progress Notes (Signed)
E Visit for Ear Pain - Swimmer's Ear  We are sorry that you are not feeling well. Here is how we plan to help!  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  Swimmer's ear is a redness or swelling, irritation, or infection of your outer ear canal. These symptoms usually occur within a few days of swimming. Your ear canal is a tube that goes from the opening of the ear to the eardrum.  When water stays in your ear canal, germs can grow.  This is a painful condition that often happens to children and swimmers of all ages.  It is not contagious and oral antibiotics are not required to treat uncomplicated swimmer's ear.  The usual symptoms include:    Itchiness inside the ear  Redness or a sense of swelling in the ear  Pain when the ear is tugged on when pressure is placed on the ear  Pus draining from the infected ear    I have prescribed: Neomycin 0.35%, polymyxin B 10,000 units/mL, and hydrocortisone 0,5% otic solution 4 drops in affected ears four times a day for 7 days  In certain cases, swimmer's ear may progress to a more serious bacterial infection of the middle or inner ear.  If you have a fever 102 and up and significantly worsening symptoms, this could indicate a more serious infection moving to the middle/inner and needs face to face evaluation in an office by a provider.  Your symptoms should improve over the next 3 days and should resolve in about 7 days.  Be sure to complete ALL of your prescription.  HOME CARE: Wash your hands frequently. If you are prescribed an ear drop, do not place the tip of the bottle on your ear or touch it with your fingers. You can take Acetaminophen 650 mg every 4-6 hours as needed for pain.  If pain is severe or moderate, you can apply a heating pad (set on low) or hot water bottle (wrapped in a towel) to outer ear for 20 minutes.  This will also increase drainage. Avoid ear plugs Do not go swimming until the symptoms are  gone Do not use Q-tips After showers, help the water run out by tilting your head to one side.   GET HELP RIGHT AWAY IF: Fever is over 102.2 degrees. You develop progressive ear pain or hearing loss. Ear symptoms persist longer than 3 days after treatment.  MAKE SURE YOU: Understand these instructions. Will watch your condition. Will get help right away if you are not doing well or get worse.  TO PREVENT SWIMMER'S EAR: Use a bathing cap or custom fitted swim molds to keep your ears dry. Towel off after swimming to dry your ears. Tilt your head or pull your earlobes to allow the water to escape your ear canal. If there is still water in your ears, consider using a hairdryer on the lowest setting.  Thank you for choosing an e-visit.  Your e-visit answers were reviewed by a board certified advanced clinical practitioner to complete your personal care plan. Depending upon the condition, your plan could have included both over the counter or prescription medications.  Please review your pharmacy choice. Make sure the pharmacy is open so you can pick up the prescription now. If there is a problem, you may contact your provider through MyChart messaging and have the prescription routed to another pharmacy.  Your safety is important to us. If you have drug   allergies check your prescription carefully.   For the next 24 hours you can use MyChart to ask questions about today's visit, request a non-urgent call back, or ask for a work or school excuse. You will get an email with a survey after your eVisit asking about your experience. We would appreciate your feedback. I hope that your e-visit has been valuable and will aid in your recovery.  

## 2022-09-28 ENCOUNTER — Encounter: Payer: Self-pay | Admitting: Internal Medicine

## 2022-09-29 MED ORDER — AMOXICILLIN-POT CLAVULANATE 875-125 MG PO TABS
1.0000 | ORAL_TABLET | Freq: Two times a day (BID) | ORAL | 0 refills | Status: DC
Start: 2022-09-29 — End: 2022-10-23

## 2022-09-29 MED ORDER — PREDNISONE 10 MG PO TABS
ORAL_TABLET | ORAL | 0 refills | Status: DC
Start: 2022-09-29 — End: 2022-10-23

## 2022-10-23 ENCOUNTER — Encounter: Payer: Self-pay | Admitting: Internal Medicine

## 2022-10-23 ENCOUNTER — Telehealth (INDEPENDENT_AMBULATORY_CARE_PROVIDER_SITE_OTHER): Payer: BC Managed Care – PPO | Admitting: Internal Medicine

## 2022-10-23 VITALS — BP 117/71 | HR 76 | Ht 69.0 in | Wt 232.0 lb

## 2022-10-23 DIAGNOSIS — E1169 Type 2 diabetes mellitus with other specified complication: Secondary | ICD-10-CM

## 2022-10-23 DIAGNOSIS — I10 Essential (primary) hypertension: Secondary | ICD-10-CM

## 2022-10-23 DIAGNOSIS — H6993 Unspecified Eustachian tube disorder, bilateral: Secondary | ICD-10-CM

## 2022-10-23 DIAGNOSIS — J45909 Unspecified asthma, uncomplicated: Secondary | ICD-10-CM | POA: Diagnosis not present

## 2022-10-23 DIAGNOSIS — D509 Iron deficiency anemia, unspecified: Secondary | ICD-10-CM

## 2022-10-23 DIAGNOSIS — Z7985 Long-term (current) use of injectable non-insulin antidiabetic drugs: Secondary | ICD-10-CM

## 2022-10-23 DIAGNOSIS — E785 Hyperlipidemia, unspecified: Secondary | ICD-10-CM

## 2022-10-23 DIAGNOSIS — E669 Obesity, unspecified: Secondary | ICD-10-CM

## 2022-10-23 MED ORDER — SPIRONOLACTONE 100 MG PO TABS: ORAL_TABLET | ORAL | 0 refills | Status: DC

## 2022-10-23 MED ORDER — FLUTICASONE PROPIONATE 50 MCG/ACT NA SUSP
2.0000 | Freq: Every day | NASAL | 6 refills | Status: DC
Start: 2022-10-23 — End: 2022-12-15

## 2022-10-23 MED ORDER — BUDESONIDE-FORMOTEROL FUMARATE 80-4.5 MCG/ACT IN AERO: INHALATION_SPRAY | RESPIRATORY_TRACT | 3 refills | Status: DC

## 2022-10-23 MED ORDER — NOVOLOG PENFILL 100 UNIT/ML ~~LOC~~ SOCT: 5.0000 [IU] | Freq: Three times a day (TID) | SUBCUTANEOUS | 11 refills | Status: DC

## 2022-10-23 MED ORDER — NYSTATIN 100000 UNIT/GM EX CREA: 1.0000 | TOPICAL_CREAM | Freq: Two times a day (BID) | CUTANEOUS | 0 refills | Status: AC

## 2022-10-23 MED ORDER — FLUCONAZOLE 150 MG PO TABS: 150.0000 mg | ORAL_TABLET | Freq: Every day | ORAL | 0 refills | Status: DC

## 2022-10-23 MED ORDER — NYSTATIN 100000 UNIT/GM EX POWD: Freq: Two times a day (BID) | CUTANEOUS | 3 refills | Status: AC

## 2022-10-23 NOTE — Patient Instructions (Signed)
We may start "mealtime insulin"  depending on how your blood sugars look over the next week  Novolog was sent to your pharmacy.  Don't start yet.  Send my mychart message in one week and I will download your CBG data and send you a message   Use flonase 2 squirts in each nostril once a day for the eustachian tube dysfunction that's causing your vertig'
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
## 2022-10-25 DIAGNOSIS — H6990 Unspecified Eustachian tube disorder, unspecified ear: Secondary | ICD-10-CM | POA: Insufficient documentation

## 2022-10-25 NOTE — Assessment & Plan Note (Signed)
No weight loss since Switching to  ozempic due to lack of availability of mounjaro. . Currnetly taking  1 mg dose .  Once again I have counselled her  to start and exercise program that burns calories rather than restrict her diet to the point of taking an inadequate amount of nutrients.

## 2022-10-25 NOTE — Assessment & Plan Note (Signed)
Continue use of ozempic  and strongly urged to start an aerobic exercise she is overdue for labs because she has been unable to leave her home ue to worsening symptoms of agorophobia  Lab Results  Component Value Date   HGBA1C 7.7 (H) 09/07/2022

## 2022-10-25 NOTE — Assessment & Plan Note (Signed)
Continue GLP 1 agonist.  Metformin was stopped duee to GI intolerance.   Will add Novolog at meal time based om the next week of BS   Lab Results  Component Value Date   HGBA1C 7.7 (H) 09/07/2022

## 2022-10-25 NOTE — Assessment & Plan Note (Addendum)
BP has been lower since losing weight. Taking metoprolol 25 mg bid and spironolactone.for management of fluid retention.  She has no proteinuria /  Lab Results  Component Value Date   NA 140 09/07/2022   K 4.4 09/07/2022   CL 106 09/07/2022   CO2 22 09/07/2022   Lab Results  Component Value Date   CREATININE 1.14 (H) 09/07/2022   .lastmic

## 2022-10-25 NOTE — Assessment & Plan Note (Addendum)
Secondary to menorrhagia .  She was referred to Dr Mia Creek in August 2023 for colon ca screening , but has deferred. .  Continue iron supplementation  Lab Results  Component Value Date   WBC 11.8 (H) 09/07/2022   HGB 9.0 (L) 09/07/2022   HCT 29.0 (L) 09/07/2022   MCV 75 (L) 09/07/2022   PLT 361 09/07/2022   Lab Results  Component Value Date   IRON 34 (L) 12/13/2021   TIBC 471.8 (H) 12/13/2021   FERRITIN 5.7 (L) 12/13/2021  \

## 2022-10-25 NOTE — Assessment & Plan Note (Signed)
Advised to start flonase

## 2022-10-25 NOTE — Assessment & Plan Note (Signed)
She has been taking and tolerating atorvastatin  Last lipids Lab Results  Component Value Date   CHOL 145 09/07/2022   HDL 34 (L) 09/07/2022   LDLCALC 92 09/07/2022   LDLDIRECT 95 09/07/2022   TRIG 103 09/07/2022   CHOLHDL 4.3 09/07/2022    Lab Results  Component Value Date   ALT 12 09/07/2022   AST 11 09/07/2022   ALKPHOS 133 (H) 09/07/2022   BILITOT <0.2 09/07/2022

## 2022-10-25 NOTE — Assessment & Plan Note (Signed)
e.  flonase and afrin trial

## 2022-11-01 ENCOUNTER — Encounter: Payer: Self-pay | Admitting: Internal Medicine

## 2022-11-01 DIAGNOSIS — Z91119 Patient's noncompliance with dietary regimen due to unspecified reason: Secondary | ICD-10-CM

## 2022-11-01 DIAGNOSIS — E1169 Type 2 diabetes mellitus with other specified complication: Secondary | ICD-10-CM

## 2022-11-06 MED ORDER — NOVOLOG PENFILL 100 UNIT/ML ~~LOC~~ SOCT: 5.0000 [IU] | Freq: Three times a day (TID) | SUBCUTANEOUS | 11 refills | Status: DC

## 2022-11-06 NOTE — Assessment & Plan Note (Signed)
Review of food log and BS for July 2- July 15 has elevations post prandially due to eating  fast food entrees,  chash browns, rice,  smothered meatballs  cupcakes,  cookies and mountain dew

## 2022-11-06 NOTE — Assessment & Plan Note (Signed)
Her inability to lose weight and control diabetes is  explained by review of food log and BS for July 2- July 15: she has frequent  has elevations post prandially due to eating  fast food entrees,  chash browns, rice,  smothered meatballs  cupcakes,  cookies and mountain dew and is out of range 50% of the time .  Adding mealtime insulin 5 units tid.

## 2022-11-12 ENCOUNTER — Telehealth: Payer: Self-pay

## 2022-11-12 MED ORDER — PEN NEEDLES 33G X 4 MM MISC: 1 refills | Status: AC

## 2022-11-12 MED ORDER — PANTOPRAZOLE SODIUM 40 MG PO TBEC: 80.0000 mg | DELAYED_RELEASE_TABLET | Freq: Every day | ORAL | 1 refills | Status: DC

## 2022-11-12 MED ORDER — INSULIN ASPART 100 UNIT/ML FLEXPEN: 5.0000 [IU] | PEN_INJECTOR | Freq: Three times a day (TID) | SUBCUTANEOUS | 2 refills | Status: AC

## 2022-11-12 NOTE — Telephone Encounter (Signed)
Patient states she is supposed to take her pantoprazole (PROTONIX) 40 MG tablet two times per day, but her prescription was written for once a day, so she is now out of this medication and she cannot refill it until September, 2024.  insulin aspart (NOVOLOG PENFILL) cartridge - Patient states she does not need the cartridge.  Patient states she needs a prescription for the pen and the pen needles.  Patient states she would like for [place] to please put a rush on the pantoprazole (PROTONIX) 40 MG tablet.  Patient states she does not have a gall bladder and she is taking Ozempic which causes major acid reflux.

## 2022-11-12 NOTE — Telephone Encounter (Signed)
MEDS SENT

## 2022-11-12 NOTE — Telephone Encounter (Signed)
Pt is aware and gave a verbal understanding.  

## 2022-11-12 NOTE — Telephone Encounter (Signed)
I have pended medication corrections for your approval.

## 2022-12-15 ENCOUNTER — Telehealth: Payer: BC Managed Care – PPO | Admitting: Physician Assistant

## 2022-12-15 DIAGNOSIS — J069 Acute upper respiratory infection, unspecified: Secondary | ICD-10-CM

## 2022-12-15 MED ORDER — BENZONATATE 100 MG PO CAPS: 100.0000 mg | ORAL_CAPSULE | Freq: Three times a day (TID) | ORAL | 0 refills | Status: DC | PRN

## 2022-12-15 MED ORDER — AZELASTINE-FLUTICASONE 137-50 MCG/ACT NA SUSP: 1.0000 | Freq: Two times a day (BID) | NASAL | 0 refills | Status: AC

## 2022-12-15 NOTE — Progress Notes (Signed)
E-Visit for Upper Respiratory Infection   We are sorry you are not feeling well.  Here is how we plan to help!  Based on what you have shared with me, it looks like you may have a viral upper respiratory infection.  Upper respiratory infections are caused by a large number of viruses; however, rhinovirus is the most common cause.   I also recommend taking a home COVID test.   Symptoms vary from person to person, with common symptoms including sore throat, cough, fatigue or lack of energy and feeling of general discomfort.  A low-grade fever of up to 100.4 may present, but is often uncommon.  Symptoms vary however, and are closely related to a person's age or underlying illnesses.  The most common symptoms associated with an upper respiratory infection are nasal discharge or congestion, cough, sneezing, headache and pressure in the ears and face.  These symptoms usually persist for about 3 to 10 days, but can last up to 2 weeks.  It is important to know that upper respiratory infections do not cause serious illness or complications in most cases.    Upper respiratory infections can be transmitted from person to person, with the most common method of transmission being a person's hands.  The virus is able to live on the skin and can infect other persons for up to 2 hours after direct contact.  Also, these can be transmitted when someone coughs or sneezes; thus, it is important to cover the mouth to reduce this risk.  To keep the spread of the illness at bay, good hand hygiene is very important.  This is an infection that is most likely caused by a virus. There are no specific treatments other than to help you with the symptoms until the infection runs its course.  We are sorry you are not feeling well.  Here is how we plan to help!   For nasal congestion, you may use an oral decongestants such as Mucinex D or if you have glaucoma or high blood pressure use plain Mucinex.  Saline nasal spray or nasal  drops can help and can safely be used as often as needed for congestion.  For your congestion, I have prescribed Azelastine nasal spray two sprays in each nostril twice a day  If you do not have a history of heart disease, hypertension, diabetes or thyroid disease, prostate/bladder issues or glaucoma, you may also use Sudafed to treat nasal congestion.  It is highly recommended that you consult with a pharmacist or your primary care physician to ensure this medication is safe for you to take.     If you have a cough, you may use cough suppressants such as Delsym and Robitussin.  If you have glaucoma or high blood pressure, you can also use Coricidin HBP.   For cough I have prescribed for you A prescription cough medication called Tessalon Perles 100 mg. You may take 1-2 capsules every 8 hours as needed for cough  If you have a sore or scratchy throat, use a saltwater gargle-  to  teaspoon of salt dissolved in a 4-ounce to 8-ounce glass of warm water.  Gargle the solution for approximately 15-30 seconds and then spit.  It is important not to swallow the solution.  You can also use throat lozenges/cough drops and Chloraseptic spray to help with throat pain or discomfort.  Warm or cold liquids can also be helpful in relieving throat pain.  For headache, pain or general discomfort, you can use  Ibuprofen or Tylenol as directed.   Some authorities believe that zinc sprays or the use of Echinacea may shorten the course of your symptoms.   HOME CARE Only take medications as instructed by your medical team. Be sure to drink plenty of fluids. Water is fine as well as fruit juices, sodas and electrolyte beverages. You may want to stay away from caffeine or alcohol. If you are nauseated, try taking small sips of liquids. How do you know if you are getting enough fluid? Your urine should be a pale yellow or almost colorless. Get rest. Taking a steamy shower or using a humidifier may help nasal congestion and  ease sore throat pain. You can place a towel over your head and breathe in the steam from hot water coming from a faucet. Using a saline nasal spray works much the same way. Cough drops, hard candies and sore throat lozenges may ease your cough. Avoid close contacts especially the very young and the elderly Cover your mouth if you cough or sneeze Always remember to wash your hands.   GET HELP RIGHT AWAY IF: You develop worsening fever. If your symptoms do not improve within 10 days You develop yellow or green discharge from your nose over 3 days. You have coughing fits You develop a severe head ache or visual changes. You develop shortness of breath, difficulty breathing or start having chest pain Your symptoms persist after you have completed your treatment plan  MAKE SURE YOU  Understand these instructions. Will watch your condition. Will get help right away if you are not doing well or get worse.  Thank you for choosing an e-visit.  Your e-visit answers were reviewed by a board certified advanced clinical practitioner to complete your personal care plan. Depending upon the condition, your plan could have included both over the counter or prescription medications.  Please review your pharmacy choice. Make sure the pharmacy is open so you can pick up prescription now. If there is a problem, you may contact your provider through [employer] and have the prescription routed to another pharmacy.  Your safety is important to [place]. If you have drug allergies check your prescription carefully.   For the next 24 hours you can use MyChart to ask questions about today's visit, request a non-urgent call back, or ask for a work or school excuse. You will get an email in the next two days asking about your experience. I hope that your e-visit has been valuable and will speed your recovery.  I have spent 5 minutes in review of e-visit questionnaire, review and updating patient chart, medical  decision making and response to patient.   Tylene Fantasia Ward, PA-C

## 2022-12-19 ENCOUNTER — Encounter: Payer: Self-pay | Admitting: Internal Medicine

## 2022-12-19 MED ORDER — PREDNISONE 10 MG PO TABS: ORAL_TABLET | ORAL | 0 refills | Status: DC

## 2022-12-19 MED ORDER — AZITHROMYCIN 500 MG PO TABS: 500.0000 mg | ORAL_TABLET | Freq: Every day | ORAL | 0 refills | Status: DC

## 2022-12-20 ENCOUNTER — Encounter: Payer: Self-pay | Admitting: Family

## 2022-12-20 ENCOUNTER — Telehealth: Payer: BC Managed Care – PPO | Admitting: Family Medicine

## 2022-12-20 ENCOUNTER — Telehealth (INDEPENDENT_AMBULATORY_CARE_PROVIDER_SITE_OTHER): Payer: BC Managed Care – PPO | Admitting: Family

## 2022-12-20 VITALS — Ht 69.0 in | Wt 232.2 lb

## 2022-12-20 DIAGNOSIS — U071 COVID-19: Secondary | ICD-10-CM | POA: Diagnosis not present

## 2022-12-20 DIAGNOSIS — T3695XA Adverse effect of unspecified systemic antibiotic, initial encounter: Secondary | ICD-10-CM

## 2022-12-20 DIAGNOSIS — B379 Candidiasis, unspecified: Secondary | ICD-10-CM | POA: Diagnosis not present

## 2022-12-20 MED ORDER — FLUCONAZOLE 150 MG PO TABS: 150.0000 mg | ORAL_TABLET | Freq: Once | ORAL | 0 refills | Status: AC

## 2022-12-20 MED ORDER — ALBUTEROL SULFATE HFA 108 (90 BASE) MCG/ACT IN AERS
2.0000 | INHALATION_SPRAY | Freq: Four times a day (QID) | RESPIRATORY_TRACT | 2 refills | Status: AC | PRN
Start: 2022-12-20 — End: ?

## 2022-12-20 NOTE — Assessment & Plan Note (Signed)
Exam limited in the setting of a virtual appointment.  Patient did not appear labored in her speech while speaking with me today.  No acute respiratory distress.  Fever has resolved.  Discussed COVID quarantine guidelines.  Patient will start prednisone, azithromycin as prescribed by primary provider.  Advised Mucinex DM.  Refilled albuterol inhaler.  She will let me know how she is doing

## 2022-12-20 NOTE — Patient Instructions (Signed)
Please start azithromycin and prednisone as prescribed by Dr Darrick Huntsman. I recommend use of Mucinex DM to help break up the congestion and suppress cough as needed. I have refilled albuterol for you to use as well. Please let me know how you are doing and if any further concerns.  COVID-19 COVID-19 is an infection caused by a virus called SARS-CoV-2. This type of virus is called a coronavirus. People with COVID-19 may: Have little to no symptoms. Have mild to moderate symptoms that affect their lungs and breathing. Get very sick. What are the causes? COVID-19 is caused by a virus. This virus may be in the air as droplets or on surfaces. It can spread from an infected person when they cough, sneeze, speak, sing, or breathe. You may become infected if: You breathe in the infected droplets in the air. You touch an object that has the virus on it. What increases the risk? You are at risk of getting COVID-19 if you have been around someone with the infection. You may be more likely to get very sick if: You are 52 years old or older. You have certain medical conditions, such as: Heart disease. Diabetes. Chronic respiratory disease. Cancer. Pregnancy. You are immunocompromised. This means your body cannot fight infections easily. You have a disability or trouble moving, meaning you're immobile. What are the signs or symptoms? People may have different symptoms from COVID-19. The symptoms can also be mild to severe. They often show up in 5-6 days after being infected. But they can take up to 14 days to appear. Common symptoms are: Cough. Feeling tired. New loss of taste or smell. Fever. Less common symptoms are: Sore throat. Headache. Body or muscle aches. Diarrhea. A skin rash or odd-colored fingers or toes. Red or irritated eyes. Sometimes, COVID-19 does not cause symptoms. How is this diagnosed? COVID-19 can be diagnosed with tests done in the lab or at home. Fluid from your nose,  mouth, or lungs will be used to check for the virus. How is this treated? Treatment for COVID-19 depends on how sick you are. Mild symptoms can be treated at home with rest, fluids, and over-the-counter medicines. Severe symptoms may be treated in a hospital intensive care unit (ICU). If you have symptoms and are at risk of getting very sick, you may be given a medicine that fights viruses. This medicine is called an antiviral. How is this prevented? To protect yourself from COVID-19: Know your risk factors. Get vaccinated. If your body cannot fight infections easily, talk to your provider about treatment to help prevent COVID-19. Stay at least 1 meter away from others. Wear a well-fitted mask when: You can't stay at a distance from people. You're in a place with poor air flow. Try to be in open spaces with good air flow when in public. Wash your hands often or use an alcohol-based hand sanitizer. Cover your nose and mouth when coughing and sneezing. If you think you have COVID-19 or have been around someone who has it, stay home and be by yourself for 5-10 days. Where to find more information Centers for Disease Control and Prevention (CDC): [employer].no World Health Organization [facility]): VisitDestination.com.br Get help right away if: You have trouble breathing or get short of breath. You have pain or pressure in your chest. You cannot speak or move any part of your body. You are confused. Your symptoms get worse. These symptoms may be an emergency. Get help right away. Call 911. Do not wait to see if  the symptoms will go away. Do not drive yourself to the hospital. This information is not intended to replace advice given to you by your health care provider. Make sure you discuss any questions you have with your health care provider. Document Revised: 04/17/2022 Document Reviewed: 12/22/2021 Elsevier Patient Education  2024 [employer].

## 2022-12-20 NOTE — Progress Notes (Signed)
Virtual Visit via Video Note  I connected with Madeline Strickland on 12/20/22 at 11:00 AM EDT by a video enabled telemedicine application and verified that I am speaking with the correct person using two identifiers. Location patient: home Location provider: work  Persons participating in the virtual visit: patient, provider  I discussed the limitations of evaluation and management by telemedicine and the availability of in person appointments. The patient expressed understanding and agreed to proceed.  HPI: COVID-positive 5 days ago  Complains of dry cough, sinus and ear pain.  Endorses prior low-grade fever tmax , 101 ( 6 days ago, since resolved).   She notices at the end of a sentence , she will 'need a deep breath.'No associated palpitations, wheezing, lightheadedness, syncope, CP, leg swelling, left arm numbness.   She isnt consistently using nasal spray, tessalon perles.   Dr Darrick Huntsman sent in prednisone taper, azithromycin and she is picking up today.   Compliant with Symbicort  ED visit 12/15/2022; started on azelastine fluticasone nasal spray, Tessalon Perles  History of hypertension, asthma, obstructive sleep apnea, GERD, diabetes Tested positive for COVID yesterday  She requests diflucan  ROS: See pertinent positives and negatives per HPI.  EXAM:  VITALS per patient if applicable: Ht 5\' 9"  (1.753 m)   Wt 232 lb 3.2 oz (105.3 kg)   BMI 34.29 kg/m  BP Readings from Last 3 Encounters:  10/23/22 117/71  08/23/22 (!) 141/75  01/08/22 105/70   Wt Readings from Last 3 Encounters:  12/20/22 232 lb 3.2 oz (105.3 kg)  10/23/22 232 lb (105.2 kg)  08/23/22 233 lb (105.7 kg)    GENERAL: alert, oriented, appears well and in no acute distress  HEENT: atraumatic, conjunttiva clear, no obvious abnormalities on inspection of external nose and ears  NECK: normal movements of the head and neck  LUNGS: on inspection no signs of respiratory distress, breathing rate appears normal,  no obvious gross SOB, gasping or wheezing  CV: no obvious cyanosis  MS: moves all visible extremities without noticeable abnormality  PSYCH/NEURO: pleasant and cooperative, no obvious depression or anxiety, speech and thought processing grossly intact  ASSESSMENT AND PLAN: COVID-19 Assessment & Plan: Exam limited in the setting of a virtual appointment.  Patient did not appear labored in her speech while speaking with me today.  No acute respiratory distress.  Fever has resolved.  Discussed COVID quarantine guidelines.  Patient will start prednisone, azithromycin as prescribed by primary provider.  Advised Mucinex DM.  Refilled albuterol inhaler.  She will let me know how she is doing  Orders: -     Albuterol Sulfate HFA; Inhale 2 puffs into the lungs every 6 (six) hours as needed for wheezing or shortness of breath.  Dispense: 8 g; Refill: 2  Antibiotic-induced yeast infection -     Fluconazole; Take 1 tablet (150 mg total) by mouth once for 1 dose. Take one tablet PO once. If sxs persist, may take one tablet PO 3 days later.  Dispense: 2 tablet; Refill: 0     -we discussed possible serious and likely etiologies, options for evaluation and workup, limitations of telemedicine visit vs in person visit, treatment, treatment risks and precautions. Pt prefers to treat via telemedicine empirically rather then risking or undertaking an in person visit at this moment.    I discussed the assessment and treatment plan with the patient. The patient was provided an opportunity to ask questions and all were answered. The patient agreed with the plan and demonstrated an  understanding of the instructions.   The patient was advised to call back or seek an in-person evaluation if the symptoms worsen or if the condition fails to improve as anticipated.  Advised if desired AVS can be mailed or viewed via MyChart if Mychart user.   Rennie Plowman, FNP

## 2022-12-25 ENCOUNTER — Other Ambulatory Visit: Payer: Self-pay

## 2022-12-25 MED ORDER — METOPROLOL SUCCINATE ER 25 MG PO TB24: 25.0000 mg | ORAL_TABLET | Freq: Every day | ORAL | 3 refills | Status: DC

## 2022-12-25 NOTE — Telephone Encounter (Signed)
Received a faxed refill request from [facility] in [place] for Metoprolol Succinate 25mg .  Med is under historical provider.  Pending prescription for your signature.

## 2023-01-02 ENCOUNTER — Telehealth: Payer: Self-pay | Admitting: Internal Medicine

## 2023-01-02 MED ORDER — OZEMPIC (1 MG/DOSE) 4 MG/3ML ~~LOC~~ SOPN: PEN_INJECTOR | SUBCUTANEOUS | 2 refills | Status: DC

## 2023-01-02 NOTE — Telephone Encounter (Signed)
PA for Ozempic is needed. 

## 2023-01-02 NOTE — Telephone Encounter (Signed)
Patient just called and said she needs prior authorization on her ozempic from her insurance. Her number is [phone]. She also states she is out of her diabetes medication.

## 2023-01-04 ENCOUNTER — Telehealth: Payer: Self-pay

## 2023-01-04 ENCOUNTER — Other Ambulatory Visit: Payer: Self-pay | Admitting: Internal Medicine

## 2023-01-04 NOTE — Telephone Encounter (Signed)
Samples of this drug were given to the patient, quantity Ozempic 2mg  dosage, Lot Number [id number]

## 2023-01-07 ENCOUNTER — Telehealth: Payer: Self-pay

## 2023-01-07 NOTE — Telephone Encounter (Signed)
Pharmacy Patient Advocate Encounter   Received notification from Pt Calls Messages that prior authorization for Ozempic is required/requested.   Insurance verification completed.   The patient is insured through [facility] .   Per test claim: PA required; PA submitted to [facility] via CoverMyMeds Key/confirmation #/EOC [id number] Status is pending

## 2023-01-07 NOTE — Telephone Encounter (Signed)
PA request has been Submitted. New Encounter created for follow up. For additional info see Pharmacy Prior Auth telephone encounter from 01/07/2023.

## 2023-01-09 NOTE — Telephone Encounter (Signed)
Pharmacy Patient Advocate Encounter  Received notification from [facility] that Prior Authorization for Ozempic (1 MG/DOSE) 4MG /3ML has been APPROVED from 01/07/23 to 01/06/26   PA #/Case ID/Reference #:  [id number]

## 2023-01-10 ENCOUNTER — Telehealth: Payer: Self-pay | Admitting: Internal Medicine

## 2023-01-10 DIAGNOSIS — E1169 Type 2 diabetes mellitus with other specified complication: Secondary | ICD-10-CM

## 2023-01-10 MED ORDER — FREESTYLE LIBRE 3 SENSOR MISC
3 refills | Status: DC
Start: 2023-01-10 — End: 2024-01-08

## 2023-01-10 NOTE — Telephone Encounter (Signed)
Prescription Request  01/10/2023  LOV: Visit date not found  What is the name of the medication or equipment? Continuous Glucose Sensor (FREESTYLE LIBRE 3 SENSOR) MISC , 90 day supply   Have you contacted your pharmacy to request a refill? Yes   Which pharmacy would you like this sent to?  [id number] [place], [place] - [address] AT [facility] OF [address] [place] [address] Phone: [phone] Fax: [phone]    Patient notified that their request is being sent to the clinical staff for review and that they should receive a response within 2 business days.   Please advise at [facility] [phone]

## 2023-01-10 NOTE — Telephone Encounter (Signed)
refilled 

## 2023-01-10 NOTE — Addendum Note (Signed)
Addended by: Sandy Salaam on: 01/10/2023 04:15 PM   Modules accepted: Orders

## 2023-01-17 ENCOUNTER — Other Ambulatory Visit: Payer: Self-pay

## 2023-01-17 MED ORDER — LEVOTHYROXINE SODIUM 150 MCG PO TABS: ORAL_TABLET | ORAL | 1 refills | Status: DC

## 2023-01-25 ENCOUNTER — Other Ambulatory Visit: Payer: Self-pay | Admitting: Internal Medicine

## 2023-01-25 DIAGNOSIS — Z1212 Encounter for screening for malignant neoplasm of rectum: Secondary | ICD-10-CM

## 2023-01-25 DIAGNOSIS — Z1211 Encounter for screening for malignant neoplasm of colon: Secondary | ICD-10-CM

## 2023-03-05 ENCOUNTER — Other Ambulatory Visit: Payer: Self-pay | Admitting: Internal Medicine

## 2023-05-14 ENCOUNTER — Other Ambulatory Visit: Payer: Self-pay

## 2023-05-14 MED ORDER — SPIRONOLACTONE 100 MG PO TABS: ORAL_TABLET | ORAL | 1 refills | Status: DC

## 2023-05-27 ENCOUNTER — Other Ambulatory Visit: Payer: Self-pay | Admitting: Internal Medicine

## 2023-07-05 ENCOUNTER — Other Ambulatory Visit: Payer: Self-pay | Admitting: Internal Medicine

## 2023-07-05 ENCOUNTER — Encounter: Payer: Self-pay | Admitting: Internal Medicine

## 2023-07-05 MED ORDER — PANTOPRAZOLE SODIUM 40 MG PO TBEC: 80.0000 mg | DELAYED_RELEASE_TABLET | Freq: Every day | ORAL | 1 refills | Status: DC

## 2023-07-05 NOTE — Telephone Encounter (Signed)
 Is it okay to send rx in for twice daily use?

## 2023-07-05 NOTE — Telephone Encounter (Signed)
 Copied from CRM [id number]. Topic: Clinical - Prescription Issue >> Jul 05, 2023 11:01 AM Eunice Blase wrote: Reason for CRM: Pt called stated pantoprazole (PROTONIX) 40 MG tablet need to be changed to twice a day. Pt states the script is once a day. Please call pt at [phone].

## 2023-07-05 NOTE — Telephone Encounter (Signed)
 Copied from CRM [id number]. Topic: Clinical - Medication Refill >> Jul 05, 2023 11:04 AM Eunice Blase wrote: Most Recent Primary Care Visit:  Provider: Allegra Grana  Department: [facility]  Visit Type: MYCHART VIDEO VISIT  Date: 12/20/2022  Medication: pantoprazole (PROTONIX) 40 MG tablet,   Has the patient contacted their pharmacy? Yes (Agent: If no, request that the patient contact the pharmacy for the refill. If patient does not wish to contact the pharmacy document the reason why and proceed with request.) (Agent: If yes, when and what did the pharmacy advise?)Pharmacy need script for 80 mg or twice a day at 40 mg.  Is this the correct pharmacy for this prescription? Yes If no, delete pharmacy and type the correct one.  This is the patient's preferred pharmacy:  [facility] #[id number] - [place], [place] - [address] AT [facility] OF [address] [place] [address] Phone: [phone] Fax: [phone]   Has the prescription been filled recently? Yes  Is the patient out of the medication? Yes  Has the patient been seen for an appointment in the last year OR does the patient have an upcoming appointment? Yes  Can we respond through MyChart? Yes  Agent: Please be advised that Rx refills may take up to 3 business days. We ask that you follow-up with your pharmacy.

## 2023-07-26 ENCOUNTER — Other Ambulatory Visit: Payer: Self-pay | Admitting: Internal Medicine

## 2023-09-01 ENCOUNTER — Encounter: Payer: Self-pay | Admitting: Physician Assistant

## 2023-09-01 ENCOUNTER — Telehealth: Admitting: Physician Assistant

## 2023-09-01 DIAGNOSIS — N3000 Acute cystitis without hematuria: Secondary | ICD-10-CM

## 2023-09-01 MED ORDER — NITROFURANTOIN MONOHYD MACRO 100 MG PO CAPS: 100.0000 mg | ORAL_CAPSULE | Freq: Two times a day (BID) | ORAL | 0 refills | Status: AC

## 2023-09-01 NOTE — Progress Notes (Signed)
E-Visit for Urinary Problems ? ?We are sorry that you are not feeling well.  Here is how we plan to help! ? ?Based on what you shared with me it looks like you most likely have a simple urinary tract infection. ? ?A UTI (Urinary Tract Infection) is a bacterial infection of the bladder. ? ?Most cases of urinary tract infections are simple to treat but a key part of your care is to encourage you to drink plenty of fluids and watch your symptoms carefully. ? ?I have prescribed MacroBid 100 mg twice a day for 5 days.  Your symptoms should gradually improve. Call us if the burning in your urine worsens, you develop worsening fever, back pain or pelvic pain or if your symptoms do not resolve after completing the antibiotic. ? ?Urinary tract infections can be prevented by drinking plenty of water to keep your body hydrated.  Also be sure when you wipe, wipe from front to back and don't hold it in!  If possible, empty your bladder every 4 hours. ? ?HOME CARE ?Drink plenty of fluids ?Compete the full course of the antibiotics even if the symptoms resolve ?Remember, when you need to go?go. Holding in your urine can increase the likelihood of getting a UTI! ?GET HELP RIGHT AWAY IF: ?You cannot urinate ?You get a high fever ?Worsening back pain occurs ?You see blood in your urine ?You feel sick to your stomach or throw up ?You feel like you are going to pass out ? ?MAKE SURE YOU  ?Understand these instructions. ?Will watch your condition. ?Will get help right away if you are not doing well or get worse. ? ? ?Thank you for choosing an e-visit. ? ?Your e-visit answers were reviewed by a board certified advanced clinical practitioner to complete your personal care plan. Depending upon the condition, your plan could have included both over the counter or prescription medications. ? ?Please review your pharmacy choice. Make sure the pharmacy is open so you can pick up prescription now. If there is a problem, you may contact your  provider through MyChart messaging and have the prescription routed to another pharmacy.  Your safety is important to us. If you have drug allergies check your prescription carefully.  ? ?For the next 24 hours you can use MyChart to ask questions about today's visit, request a non-urgent call back, or ask for a work or school excuse. ?You will get an email in the next two days asking about your experience. I hope that your e-visit has been valuable and will speed your recovery. ? ?I have spent 5 minutes in review of e-visit questionnaire, review and updating patient chart, medical decision making and response to patient.  ? ?Zainab Crumrine S Mayers, PA-C ? ? ? ? ?

## 2023-09-09 ENCOUNTER — Other Ambulatory Visit: Payer: Self-pay | Admitting: Internal Medicine

## 2023-09-20 ENCOUNTER — Encounter: Payer: Self-pay | Admitting: Internal Medicine

## 2023-09-26 MED ORDER — SEMAGLUTIDE (2 MG/DOSE) 8 MG/3ML ~~LOC~~ SOPN: 2.0000 mg | PEN_INJECTOR | SUBCUTANEOUS | 2 refills | Status: DC

## 2023-09-26 NOTE — Telephone Encounter (Signed)
 Left message to call the office back.

## 2023-10-08 ENCOUNTER — Ambulatory Visit: Admitting: Internal Medicine

## 2023-11-03 ENCOUNTER — Telehealth: Admitting: Family Medicine

## 2023-11-03 DIAGNOSIS — B9689 Other specified bacterial agents as the cause of diseases classified elsewhere: Secondary | ICD-10-CM | POA: Diagnosis not present

## 2023-11-03 DIAGNOSIS — N760 Acute vaginitis: Secondary | ICD-10-CM | POA: Diagnosis not present

## 2023-11-03 MED ORDER — METRONIDAZOLE 500 MG PO TABS: 500.0000 mg | ORAL_TABLET | Freq: Two times a day (BID) | ORAL | 0 refills | Status: AC

## 2023-11-03 NOTE — Progress Notes (Signed)

## 2023-11-16 ENCOUNTER — Other Ambulatory Visit: Payer: Self-pay | Admitting: Internal Medicine

## 2023-11-21 ENCOUNTER — Telehealth: Payer: Self-pay

## 2023-11-21 NOTE — Telephone Encounter (Signed)
 Received FMLA paperwork for pt due to her having to care for her husband since he has gotten out of the hospital. Paperwork has been placed in red folder.

## 2023-11-27 DIAGNOSIS — Z0279 Encounter for issue of other medical certificate: Secondary | ICD-10-CM

## 2023-11-28 NOTE — Telephone Encounter (Signed)
 LMTCB. Need to let pt know that there is a $50 charge for the completion of the paperwork.

## 2023-11-28 NOTE — Telephone Encounter (Signed)
 Left a message and sent a mychart message letting pt know that FMLA paperwork is complete and has been faxed to [facility].

## 2023-11-28 NOTE — Telephone Encounter (Unsigned)
 Copied from CRM #[id number]. Topic: General - Other >> Nov 28, 2023 12:48 PM Gennette ORN wrote: Reason for CRM: Patient wants to know is it going to be a charge for the paperwork . Patient stated you can call or message back on My Chart.

## 2023-12-30 ENCOUNTER — Other Ambulatory Visit: Payer: Self-pay | Admitting: Internal Medicine

## 2023-12-31 ENCOUNTER — Other Ambulatory Visit: Payer: Self-pay

## 2023-12-31 MED ORDER — PANTOPRAZOLE SODIUM 40 MG PO TBEC: 80.0000 mg | DELAYED_RELEASE_TABLET | Freq: Every day | ORAL | 0 refills | Status: DC

## 2024-01-08 ENCOUNTER — Encounter: Payer: Self-pay | Admitting: Internal Medicine

## 2024-01-08 ENCOUNTER — Telehealth (INDEPENDENT_AMBULATORY_CARE_PROVIDER_SITE_OTHER): Admitting: Internal Medicine

## 2024-01-08 VITALS — BP 124/65 | Ht 69.0 in | Wt 226.0 lb

## 2024-01-08 DIAGNOSIS — I10 Essential (primary) hypertension: Secondary | ICD-10-CM

## 2024-01-08 DIAGNOSIS — D509 Iron deficiency anemia, unspecified: Secondary | ICD-10-CM

## 2024-01-08 DIAGNOSIS — Z794 Long term (current) use of insulin: Secondary | ICD-10-CM

## 2024-01-08 DIAGNOSIS — D510 Vitamin B12 deficiency anemia due to intrinsic factor deficiency: Secondary | ICD-10-CM | POA: Diagnosis not present

## 2024-01-08 DIAGNOSIS — E1169 Type 2 diabetes mellitus with other specified complication: Secondary | ICD-10-CM

## 2024-01-08 DIAGNOSIS — F4001 Agoraphobia with panic disorder: Secondary | ICD-10-CM

## 2024-01-08 DIAGNOSIS — E559 Vitamin D deficiency, unspecified: Secondary | ICD-10-CM | POA: Diagnosis not present

## 2024-01-08 DIAGNOSIS — E669 Obesity, unspecified: Secondary | ICD-10-CM

## 2024-01-08 DIAGNOSIS — Z7985 Long-term (current) use of injectable non-insulin antidiabetic drugs: Secondary | ICD-10-CM

## 2024-01-08 MED ORDER — LEVOTHYROXINE SODIUM 150 MCG PO TABS: ORAL_TABLET | ORAL | 1 refills | Status: AC

## 2024-01-08 MED ORDER — SYRINGE 25G X 1" 3 ML MISC: 0 refills | Status: AC

## 2024-01-08 MED ORDER — CYANOCOBALAMIN 1000 MCG/ML IJ SOLN: INTRAMUSCULAR | 4 refills | Status: AC

## 2024-01-08 MED ORDER — METOPROLOL SUCCINATE ER 25 MG PO TB24: 25.0000 mg | ORAL_TABLET | Freq: Every day | ORAL | 3 refills | Status: DC

## 2024-01-08 MED ORDER — FREESTYLE LIBRE 3 SENSOR MISC: 3 refills | Status: AC

## 2024-01-08 MED ORDER — MONTELUKAST SODIUM 10 MG PO TABS: 10.0000 mg | ORAL_TABLET | Freq: Every day | ORAL | 1 refills | Status: AC
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
## 2024-01-08 NOTE — Patient Instructions (Addendum)
 You can take the ozempic  2 mg pen and count clicks to get a lower dose out of it   1 mg dose    36 clicks

## 2024-01-08 NOTE — Progress Notes (Signed)
 Current glucose is 150

## 2024-01-11 NOTE — Assessment & Plan Note (Signed)
 Has been taking  monthly parenteral doses of 1000 mcg  .  She denies numbling and tingling and symptoms of proprioceptive loss   Lab Results  Component Value Date   VITAMINB12 336 12/13/2021

## 2024-01-11 NOTE — Assessment & Plan Note (Addendum)
 History of anemia was attributed to menorrhagia . Unclear if her anemia has resolved or multifactorial as she has not had labs or colon cancer screening   She was referred to Dr Maryruth in August 2023 for colon ca screening , but  deferred.due to her psychiatric condition of agoraphobia.  .  Continue iron supplementation  Lab Results  Component Value Date   WBC 11.8 (H) 09/07/2022   HGB 9.0 (L) 09/07/2022   HCT 29.0 (L) 09/07/2022   MCV 75 (L) 09/07/2022   PLT 361 09/07/2022   Lab Results  Component Value Date   IRON 34 (L) 12/13/2021   TIBC 471.8 (H) 12/13/2021   FERRITIN 5.7 (L) 12/13/2021  \

## 2024-01-11 NOTE — Assessment & Plan Note (Addendum)
 Developed during COVID pandemic,  With continued fear restricting her movement ,  access to health care, and now her employment.  She is no longer seeing Dr Chipper and is now seeing a psychiatrist  at [facility] who  recommended therapy last year with anafranil.  She is not taking the medication. Her health is suffering because of her psychiatric illness and isolation.   Home Health referral  made today for skilled nursing to  provide  phlebotomy services.  If her insurance will not cover,  she is willing to pay out of pocket for [facility] to provide in home phlebotomy.

## 2024-01-11 NOTE — Assessment & Plan Note (Signed)
 BP improved with weight loss but has not been checked in nearly a year.  She continues to take  metoprolol  25 mg bid and spironolactone .for management of fluid retention.  She has no proteinuria based on last year's exam but is overdue for surveillance labs.   Lab Results  Component Value Date   NA 140 09/07/2022   K 4.4 09/07/2022   CL 106 09/07/2022   CO2 22 09/07/2022   Lab Results  Component Value Date   CREATININE 1.14 (H) 09/07/2022   Lab Results  Component Value Date   LABMICR 4.3 09/07/2022   MICROALBUR 1.5 10/22/2014

## 2024-01-11 NOTE — Assessment & Plan Note (Signed)
 Continue use of ozempic   and strongly urged to start an aerobic exercise program as she remains morbidly obese despite reportedly minimal food intake of a diet that is not carbohydrate restrictive.   . she is  LONG OVERDUE for labs because she states that she has been unable to leave her home due to recurrence of agoraphobia   Lab Results  Component Value Date   HGBA1C 7.7 (H) 09/07/2022

## 2024-01-11 NOTE — Assessment & Plan Note (Signed)
 Managed with daily doses of 2500 Ius

## 2024-02-18 ENCOUNTER — Other Ambulatory Visit: Payer: Self-pay | Admitting: Internal Medicine

## 2024-03-27 ENCOUNTER — Telehealth: Admitting: Internal Medicine

## 2024-03-27 ENCOUNTER — Other Ambulatory Visit: Payer: Self-pay

## 2024-03-27 ENCOUNTER — Encounter: Payer: Self-pay | Admitting: Internal Medicine

## 2024-03-27 VITALS — BP 106/58 | Ht 69.0 in | Wt 228.0 lb

## 2024-03-27 DIAGNOSIS — E119 Type 2 diabetes mellitus without complications: Secondary | ICD-10-CM

## 2024-03-27 DIAGNOSIS — E1169 Type 2 diabetes mellitus with other specified complication: Secondary | ICD-10-CM

## 2024-03-27 DIAGNOSIS — E034 Atrophy of thyroid (acquired): Secondary | ICD-10-CM

## 2024-03-27 DIAGNOSIS — J45909 Unspecified asthma, uncomplicated: Secondary | ICD-10-CM

## 2024-03-27 DIAGNOSIS — D509 Iron deficiency anemia, unspecified: Secondary | ICD-10-CM

## 2024-03-27 DIAGNOSIS — F4001 Agoraphobia with panic disorder: Secondary | ICD-10-CM

## 2024-03-27 DIAGNOSIS — I10 Essential (primary) hypertension: Secondary | ICD-10-CM

## 2024-03-27 MED ORDER — PANTOPRAZOLE SODIUM 40 MG PO TBEC: 80.0000 mg | DELAYED_RELEASE_TABLET | Freq: Every day | ORAL | 0 refills | Status: AC

## 2024-03-27 MED ORDER — TIRZEPATIDE 10 MG/0.5ML ~~LOC~~ SOAJ: 10.0000 mg | SUBCUTANEOUS | 2 refills | Status: AC

## 2024-03-27 MED ORDER — SPIRONOLACTONE 100 MG PO TABS: ORAL_TABLET | ORAL | 1 refills | Status: AC

## 2024-03-27 MED ORDER — BUDESONIDE-FORMOTEROL FUMARATE 80-4.5 MCG/ACT IN AERO: INHALATION_SPRAY | RESPIRATORY_TRACT | 3 refills | Status: AC
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
## 2024-03-29 NOTE — Assessment & Plan Note (Addendum)
 Current control is reportedly excellent based on reports from [facility] . However she  is overdue for surveillance labs.   Changing therapy from maximallly tolerated dose of ozempic  (1 mg ) to Mounjaro  0 mg weekly per patient request

## 2024-03-29 NOTE — Assessment & Plan Note (Signed)
 She has been taking and tolerating atorvastatin .  LDL goal is 70 or less.   Last lipids Lab Results  Component Value Date   CHOL 145 09/07/2022   HDL 34 (L) 09/07/2022   LDLCALC 92 09/07/2022   LDLDIRECT 95 09/07/2022   TRIG 103 09/07/2022   CHOLHDL 4.3 09/07/2022    Lab Results  Component Value Date   ALT 12 09/07/2022   AST 11 09/07/2022   ALKPHOS 133 (H) 09/07/2022   BILITOT <0.2 09/07/2022

## 2024-03-29 NOTE — Assessment & Plan Note (Signed)
 She states that her anxiety resolved with her mother's passing

## 2024-03-30 ENCOUNTER — Other Ambulatory Visit (HOSPITAL_COMMUNITY): Payer: Self-pay

## 2024-03-30 ENCOUNTER — Telehealth: Payer: Self-pay | Admitting: Pharmacy Technician

## 2024-03-30 NOTE — Telephone Encounter (Signed)
 Pharmacy Patient Advocate Encounter   Received notification from CoverMyMeds that prior authorization for Mounjaro  10MG /0.5ML auto-injectors  is required/requested.   Insurance verification completed.   The patient is insured through [facility].   Per test claim: PA required; PA started via CoverMyMeds. KEY [id number] . Waiting for clinical questions to populate.

## 2024-03-31 ENCOUNTER — Other Ambulatory Visit: Payer: Self-pay | Admitting: Internal Medicine

## 2024-03-31 ENCOUNTER — Other Ambulatory Visit (HOSPITAL_COMMUNITY): Payer: Self-pay

## 2024-03-31 NOTE — Telephone Encounter (Signed)
 Pharmacy Patient Advocate Encounter  Received notification from [employer] that Prior Authorization for Mounjaro  10MG /0.5ML auto-injectors  has been APPROVED from 03/31/24 to 04/01/27   PA #/Case ID/Reference #: [id number]

## 2024-03-31 NOTE — Telephone Encounter (Signed)
 Clinical questions have been answered and PA submitted. PA currently Pending. Please be advised that most companies allow up to 30 days to make a decision. We will advise when a determination has been made, or follow up in 1 week.   Please reach out to our team, Rx Prior Auth Pool, if you haven't heard back in a week.

## 2024-04-01 ENCOUNTER — Other Ambulatory Visit
